# Patient Record
Sex: Male | Born: 1935 | Race: White | Hispanic: No | Marital: Married | Smoking: Former smoker
Health system: Southern US, Community
[De-identification: ages and names within clinical notes are randomized; demographics above are authoritative.]

## PROBLEM LIST (undated history)

## (undated) DIAGNOSIS — I499 Cardiac arrhythmia, unspecified: Secondary | ICD-10-CM

## (undated) DIAGNOSIS — R911 Solitary pulmonary nodule: Secondary | ICD-10-CM

## (undated) DIAGNOSIS — E785 Hyperlipidemia, unspecified: Secondary | ICD-10-CM

## (undated) DIAGNOSIS — K260 Acute duodenal ulcer with hemorrhage: Secondary | ICD-10-CM

## (undated) DIAGNOSIS — H409 Unspecified glaucoma: Secondary | ICD-10-CM

## (undated) DIAGNOSIS — Z87898 Personal history of other specified conditions: Secondary | ICD-10-CM

## (undated) DIAGNOSIS — K635 Polyp of colon: Secondary | ICD-10-CM

## (undated) DIAGNOSIS — S82009A Unspecified fracture of unspecified patella, initial encounter for closed fracture: Secondary | ICD-10-CM

## (undated) DIAGNOSIS — I10 Essential (primary) hypertension: Secondary | ICD-10-CM

## (undated) DIAGNOSIS — M1990 Unspecified osteoarthritis, unspecified site: Secondary | ICD-10-CM

## (undated) DIAGNOSIS — N289 Disorder of kidney and ureter, unspecified: Secondary | ICD-10-CM

## (undated) DIAGNOSIS — I639 Cerebral infarction, unspecified: Secondary | ICD-10-CM

## (undated) DIAGNOSIS — Z972 Presence of dental prosthetic device (complete) (partial): Secondary | ICD-10-CM

## (undated) DIAGNOSIS — D649 Anemia, unspecified: Secondary | ICD-10-CM

## (undated) HISTORY — DX: Cerebral infarction, unspecified: I63.9

## (undated) HISTORY — DX: Anemia, unspecified: D64.9

## (undated) HISTORY — PX: CATARACT EXTRACTION: SUR2

## (undated) HISTORY — PX: BAND HEMORRHOIDECTOMY: SHX1213

## (undated) HISTORY — DX: Polyp of colon: K63.5

## (undated) HISTORY — DX: Disorder of kidney and ureter, unspecified: N28.9

## (undated) HISTORY — DX: Hyperlipidemia, unspecified: E78.5

## (undated) HISTORY — DX: Essential (primary) hypertension: I10

## (undated) HISTORY — DX: Acute duodenal ulcer with hemorrhage: K26.0

---

## 2000-11-26 ENCOUNTER — Encounter: Payer: Self-pay | Admitting: Emergency Medicine

## 2000-11-26 ENCOUNTER — Inpatient Hospital Stay (HOSPITAL_COMMUNITY): Admission: EM | Admit: 2000-11-26 | Discharge: 2000-11-29 | Payer: Self-pay | Admitting: *Deleted

## 2000-11-26 ENCOUNTER — Encounter: Payer: Self-pay | Admitting: *Deleted

## 2000-12-01 ENCOUNTER — Encounter: Admission: RE | Admit: 2000-12-01 | Discharge: 2001-01-10 | Payer: Self-pay | Admitting: Pediatrics

## 2001-03-07 ENCOUNTER — Encounter: Admission: RE | Admit: 2001-03-07 | Discharge: 2001-06-05 | Payer: Self-pay | Admitting: Internal Medicine

## 2004-01-15 ENCOUNTER — Encounter (HOSPITAL_COMMUNITY): Admission: RE | Admit: 2004-01-15 | Discharge: 2004-04-14 | Payer: Self-pay | Admitting: Internal Medicine

## 2004-01-27 ENCOUNTER — Ambulatory Visit: Payer: Self-pay | Admitting: Internal Medicine

## 2004-02-04 ENCOUNTER — Ambulatory Visit: Payer: Self-pay | Admitting: Internal Medicine

## 2004-02-10 ENCOUNTER — Encounter (INDEPENDENT_AMBULATORY_CARE_PROVIDER_SITE_OTHER): Payer: Self-pay | Admitting: Specialist

## 2004-02-10 ENCOUNTER — Ambulatory Visit (HOSPITAL_COMMUNITY): Admission: RE | Admit: 2004-02-10 | Discharge: 2004-02-10 | Payer: Self-pay | Admitting: Internal Medicine

## 2004-03-30 ENCOUNTER — Ambulatory Visit: Payer: Self-pay | Admitting: Internal Medicine

## 2004-04-27 ENCOUNTER — Ambulatory Visit: Payer: Self-pay | Admitting: Internal Medicine

## 2004-05-13 ENCOUNTER — Ambulatory Visit: Payer: Self-pay | Admitting: Internal Medicine

## 2004-05-28 ENCOUNTER — Ambulatory Visit: Payer: Self-pay | Admitting: Internal Medicine

## 2004-07-08 ENCOUNTER — Ambulatory Visit: Payer: Self-pay | Admitting: Internal Medicine

## 2004-08-06 ENCOUNTER — Ambulatory Visit: Payer: Self-pay | Admitting: Internal Medicine

## 2004-09-07 ENCOUNTER — Ambulatory Visit: Payer: Self-pay | Admitting: Internal Medicine

## 2004-10-07 ENCOUNTER — Ambulatory Visit: Payer: Self-pay | Admitting: Internal Medicine

## 2004-11-09 ENCOUNTER — Ambulatory Visit: Payer: Self-pay | Admitting: Internal Medicine

## 2004-12-10 ENCOUNTER — Ambulatory Visit: Payer: Self-pay | Admitting: Internal Medicine

## 2004-12-23 ENCOUNTER — Ambulatory Visit: Payer: Self-pay | Admitting: Internal Medicine

## 2005-01-25 ENCOUNTER — Ambulatory Visit: Payer: Self-pay | Admitting: Internal Medicine

## 2005-02-18 ENCOUNTER — Ambulatory Visit: Payer: Self-pay | Admitting: Internal Medicine

## 2005-02-25 ENCOUNTER — Ambulatory Visit: Payer: Self-pay | Admitting: Internal Medicine

## 2005-03-30 ENCOUNTER — Ambulatory Visit: Payer: Self-pay | Admitting: Internal Medicine

## 2005-04-27 ENCOUNTER — Ambulatory Visit: Payer: Self-pay | Admitting: Internal Medicine

## 2005-05-24 ENCOUNTER — Ambulatory Visit: Payer: Self-pay | Admitting: Gastroenterology

## 2005-05-25 ENCOUNTER — Ambulatory Visit: Payer: Self-pay | Admitting: Internal Medicine

## 2005-07-06 ENCOUNTER — Ambulatory Visit (HOSPITAL_COMMUNITY): Admission: RE | Admit: 2005-07-06 | Discharge: 2005-07-06 | Payer: Self-pay | Admitting: Gastroenterology

## 2005-07-06 ENCOUNTER — Encounter (INDEPENDENT_AMBULATORY_CARE_PROVIDER_SITE_OTHER): Payer: Self-pay | Admitting: *Deleted

## 2005-07-09 ENCOUNTER — Ambulatory Visit: Payer: Self-pay | Admitting: Gastroenterology

## 2005-07-26 ENCOUNTER — Ambulatory Visit: Payer: Self-pay | Admitting: Gastroenterology

## 2005-07-27 ENCOUNTER — Ambulatory Visit: Payer: Self-pay | Admitting: Internal Medicine

## 2005-08-30 ENCOUNTER — Ambulatory Visit: Payer: Self-pay | Admitting: Internal Medicine

## 2005-09-28 ENCOUNTER — Ambulatory Visit: Payer: Self-pay | Admitting: Internal Medicine

## 2005-10-07 ENCOUNTER — Ambulatory Visit: Payer: Self-pay | Admitting: Internal Medicine

## 2005-10-27 ENCOUNTER — Ambulatory Visit: Payer: Self-pay | Admitting: Internal Medicine

## 2005-11-29 ENCOUNTER — Ambulatory Visit: Payer: Self-pay | Admitting: Internal Medicine

## 2005-12-30 ENCOUNTER — Ambulatory Visit: Payer: Self-pay | Admitting: Internal Medicine

## 2006-01-27 ENCOUNTER — Ambulatory Visit: Payer: Self-pay | Admitting: Internal Medicine

## 2006-02-28 ENCOUNTER — Ambulatory Visit: Payer: Self-pay | Admitting: Internal Medicine

## 2006-03-31 ENCOUNTER — Ambulatory Visit: Payer: Self-pay | Admitting: Internal Medicine

## 2006-05-02 ENCOUNTER — Ambulatory Visit: Payer: Self-pay | Admitting: Internal Medicine

## 2006-05-31 ENCOUNTER — Ambulatory Visit: Payer: Self-pay | Admitting: Internal Medicine

## 2006-06-29 ENCOUNTER — Ambulatory Visit: Payer: Self-pay | Admitting: Internal Medicine

## 2006-07-13 ENCOUNTER — Ambulatory Visit: Payer: Self-pay | Admitting: Internal Medicine

## 2006-08-12 ENCOUNTER — Ambulatory Visit: Payer: Self-pay | Admitting: Internal Medicine

## 2006-08-22 ENCOUNTER — Ambulatory Visit: Payer: Self-pay | Admitting: Internal Medicine

## 2006-08-22 LAB — CONVERTED CEMR LAB
ALT: 15 units/L (ref 0–40)
AST: 24 units/L (ref 0–37)
Albumin: 3.7 g/dL (ref 3.5–5.2)
Alkaline Phosphatase: 118 units/L — ABNORMAL HIGH (ref 39–117)
BUN: 42 mg/dL — ABNORMAL HIGH (ref 6–23)
Bilirubin, Direct: 0.1 mg/dL (ref 0.0–0.3)
CO2: 23 meq/L (ref 19–32)
Calcium: 8.9 mg/dL (ref 8.4–10.5)
Chloride: 111 meq/L (ref 96–112)
Cholesterol: 179 mg/dL (ref 0–200)
Creatinine, Ser: 2.5 mg/dL — ABNORMAL HIGH (ref 0.4–1.5)
GFR calc Af Amer: 33 mL/min
GFR calc non Af Amer: 27 mL/min
Glucose, Bld: 104 mg/dL — ABNORMAL HIGH (ref 70–99)
HDL: 39.1 mg/dL (ref >39.0)
LDL Cholesterol: 115 mg/dL — ABNORMAL HIGH (ref 0–99)
Potassium: 4.6 meq/L (ref 3.5–5.1)
Sodium: 140 meq/L (ref 135–145)
Total Bilirubin: 0.7 mg/dL (ref 0.3–1.2)
Total CHOL/HDL Ratio: 4.6
Total Protein: 7.9 g/dL (ref 6.0–8.3)
Triglycerides: 123 mg/dL (ref 0–149)
Uric Acid, Serum: 10.3 mg/dL — ABNORMAL HIGH (ref 2.4–7.0)
VLDL: 25 mg/dL (ref 0–40)

## 2006-08-24 DIAGNOSIS — N183 Chronic kidney disease, stage 3 unspecified: Secondary | ICD-10-CM | POA: Insufficient documentation

## 2006-08-24 DIAGNOSIS — M109 Gout, unspecified: Secondary | ICD-10-CM

## 2006-08-24 DIAGNOSIS — I10 Essential (primary) hypertension: Secondary | ICD-10-CM | POA: Insufficient documentation

## 2006-08-24 DIAGNOSIS — Z8679 Personal history of other diseases of the circulatory system: Secondary | ICD-10-CM

## 2006-08-24 DIAGNOSIS — E785 Hyperlipidemia, unspecified: Secondary | ICD-10-CM

## 2006-08-26 ENCOUNTER — Encounter: Admission: RE | Admit: 2006-08-26 | Discharge: 2006-08-26 | Payer: Self-pay | Admitting: Internal Medicine

## 2006-09-12 ENCOUNTER — Ambulatory Visit: Payer: Self-pay | Admitting: Internal Medicine

## 2006-09-21 ENCOUNTER — Ambulatory Visit: Payer: Self-pay | Admitting: Internal Medicine

## 2006-10-27 ENCOUNTER — Ambulatory Visit: Payer: Self-pay | Admitting: Internal Medicine

## 2006-10-27 LAB — CONVERTED CEMR LAB
INR: 3.4
Prothrombin Time: 22.2 s

## 2006-11-04 ENCOUNTER — Ambulatory Visit: Payer: Self-pay | Admitting: Internal Medicine

## 2006-11-04 DIAGNOSIS — I4891 Unspecified atrial fibrillation: Secondary | ICD-10-CM | POA: Insufficient documentation

## 2006-11-04 LAB — CONVERTED CEMR LAB
BUN: 23 mg/dL (ref 6–23)
CO2: 29 meq/L (ref 19–32)
Calcium: 9 mg/dL (ref 8.4–10.5)
Chloride: 108 meq/L (ref 96–112)
Cholesterol, target level: 200 mg/dL
Cholesterol: 159 mg/dL (ref 0–200)
Creatinine, Ser: 1.9 mg/dL — ABNORMAL HIGH (ref 0.4–1.5)
GFR calc Af Amer: 45 mL/min
GFR calc non Af Amer: 37 mL/min
Glucose, Bld: 88 mg/dL (ref 70–99)
HDL goal, serum: 40 mg/dL
HDL: 39.3 mg/dL (ref >39.0)
INR: 2.3 — ABNORMAL HIGH (ref 0.9–2.0)
LDL Cholesterol: 100 mg/dL — ABNORMAL HIGH (ref 0–99)
LDL Goal: 100 mg/dL
Potassium: 4.4 meq/L (ref 3.5–5.1)
Prothrombin Time: 18.9 s — ABNORMAL HIGH (ref 10.0–14.0)
Sodium: 143 meq/L (ref 135–145)
Total CHOL/HDL Ratio: 4
Triglycerides: 98 mg/dL (ref 0–149)
VLDL: 20 mg/dL (ref 0–40)
aPTT: 34.9 s (ref 26.5–36.5)

## 2006-11-28 ENCOUNTER — Ambulatory Visit: Payer: Self-pay | Admitting: Internal Medicine

## 2006-11-28 LAB — CONVERTED CEMR LAB
INR: 2.3
Prothrombin Time: 18.5 s

## 2006-12-28 ENCOUNTER — Ambulatory Visit: Payer: Self-pay | Admitting: Internal Medicine

## 2006-12-28 LAB — CONVERTED CEMR LAB
INR: 1.8
Prothrombin Time: 16.5 s

## 2007-01-27 ENCOUNTER — Ambulatory Visit: Payer: Self-pay | Admitting: Internal Medicine

## 2007-01-27 LAB — CONVERTED CEMR LAB
INR: 3.3
Prothrombin Time: 22 s

## 2007-02-27 ENCOUNTER — Ambulatory Visit: Payer: Self-pay | Admitting: Internal Medicine

## 2007-02-27 LAB — CONVERTED CEMR LAB
INR: 1.9
Prothrombin Time: 17.1 s

## 2007-03-31 ENCOUNTER — Ambulatory Visit: Payer: Self-pay | Admitting: Internal Medicine

## 2007-03-31 LAB — CONVERTED CEMR LAB
INR: 1.9
Prothrombin Time: 16.8 s

## 2007-04-19 ENCOUNTER — Emergency Department (HOSPITAL_COMMUNITY): Admission: EM | Admit: 2007-04-19 | Discharge: 2007-04-19 | Payer: Self-pay | Admitting: Family Medicine

## 2007-04-20 ENCOUNTER — Telehealth: Payer: Self-pay | Admitting: Internal Medicine

## 2007-04-28 ENCOUNTER — Ambulatory Visit: Payer: Self-pay | Admitting: Internal Medicine

## 2007-04-28 LAB — CONVERTED CEMR LAB
INR: 2
Prothrombin Time: 17.3 s

## 2007-05-10 ENCOUNTER — Ambulatory Visit: Payer: Self-pay | Admitting: Internal Medicine

## 2007-05-12 LAB — CONVERTED CEMR LAB
ALT: 21 units/L (ref 0–53)
AST: 26 units/L (ref 0–37)
Albumin: 3.7 g/dL (ref 3.5–5.2)
Alkaline Phosphatase: 106 units/L (ref 39–117)
BUN: 31 mg/dL — ABNORMAL HIGH (ref 6–23)
Bilirubin, Direct: 0.1 mg/dL (ref 0.0–0.3)
CO2: 25 meq/L (ref 19–32)
Calcium: 9.2 mg/dL (ref 8.4–10.5)
Chloride: 106 meq/L (ref 96–112)
Cholesterol: 162 mg/dL (ref 0–200)
Creatinine, Ser: 1.8 mg/dL — ABNORMAL HIGH (ref 0.4–1.5)
GFR calc Af Amer: 48 mL/min
GFR calc non Af Amer: 40 mL/min
Glucose, Bld: 119 mg/dL — ABNORMAL HIGH (ref 70–99)
HDL: 37.9 mg/dL — ABNORMAL LOW (ref >39.0)
LDL Cholesterol: 94 mg/dL (ref 0–99)
Potassium: 4.9 meq/L (ref 3.5–5.1)
Sodium: 138 meq/L (ref 135–145)
Total Bilirubin: 0.7 mg/dL (ref 0.3–1.2)
Total CHOL/HDL Ratio: 4.3
Total Protein: 7.1 g/dL (ref 6.0–8.3)
Triglycerides: 152 mg/dL — ABNORMAL HIGH (ref 0–149)
VLDL: 30 mg/dL (ref 0–40)

## 2007-05-26 ENCOUNTER — Ambulatory Visit: Payer: Self-pay | Admitting: Internal Medicine

## 2007-05-26 LAB — CONVERTED CEMR LAB
INR: 2.1
Prothrombin Time: 17.9 s

## 2007-06-01 ENCOUNTER — Ambulatory Visit: Payer: Self-pay | Admitting: Internal Medicine

## 2007-06-01 ENCOUNTER — Encounter: Payer: Self-pay | Admitting: Internal Medicine

## 2007-06-23 ENCOUNTER — Ambulatory Visit: Payer: Self-pay | Admitting: Internal Medicine

## 2007-06-23 LAB — CONVERTED CEMR LAB
INR: 2.3
Prothrombin Time: 18.4 s

## 2007-07-25 ENCOUNTER — Ambulatory Visit: Payer: Self-pay | Admitting: Internal Medicine

## 2007-07-25 LAB — CONVERTED CEMR LAB
INR: 2.5
Prothrombin Time: 19.3 s

## 2007-08-28 ENCOUNTER — Ambulatory Visit: Payer: Self-pay | Admitting: Internal Medicine

## 2007-08-28 LAB — CONVERTED CEMR LAB
INR: 2.4
Prothrombin Time: 18.8 s

## 2007-09-25 ENCOUNTER — Ambulatory Visit: Payer: Self-pay | Admitting: Internal Medicine

## 2007-09-25 LAB — CONVERTED CEMR LAB
INR: 2.7
Prothrombin Time: 20 s

## 2007-10-19 ENCOUNTER — Telehealth: Payer: Self-pay | Admitting: Internal Medicine

## 2007-10-27 ENCOUNTER — Ambulatory Visit: Payer: Self-pay | Admitting: Internal Medicine

## 2007-11-14 ENCOUNTER — Ambulatory Visit: Payer: Self-pay | Admitting: Internal Medicine

## 2007-11-14 LAB — CONVERTED CEMR LAB
INR: 1.9
Prothrombin Time: 16.9 s

## 2007-11-15 ENCOUNTER — Telehealth: Payer: Self-pay | Admitting: Internal Medicine

## 2007-11-22 ENCOUNTER — Inpatient Hospital Stay (HOSPITAL_COMMUNITY): Admission: EM | Admit: 2007-11-22 | Discharge: 2007-11-24 | Payer: Self-pay | Admitting: Emergency Medicine

## 2007-11-22 ENCOUNTER — Encounter: Payer: Self-pay | Admitting: Gastroenterology

## 2007-11-22 ENCOUNTER — Ambulatory Visit: Payer: Self-pay | Admitting: Internal Medicine

## 2007-11-22 ENCOUNTER — Encounter: Payer: Self-pay | Admitting: Internal Medicine

## 2007-11-22 ENCOUNTER — Ambulatory Visit: Payer: Self-pay | Admitting: Gastroenterology

## 2007-11-24 ENCOUNTER — Encounter: Payer: Self-pay | Admitting: Internal Medicine

## 2007-11-24 DIAGNOSIS — K260 Acute duodenal ulcer with hemorrhage: Secondary | ICD-10-CM | POA: Insufficient documentation

## 2007-11-24 DIAGNOSIS — J984 Other disorders of lung: Secondary | ICD-10-CM

## 2007-11-24 HISTORY — DX: Acute duodenal ulcer with hemorrhage: K26.0

## 2007-11-28 ENCOUNTER — Telehealth: Payer: Self-pay | Admitting: Internal Medicine

## 2007-11-28 ENCOUNTER — Ambulatory Visit: Payer: Self-pay | Admitting: Internal Medicine

## 2007-11-28 LAB — CONVERTED CEMR LAB
Basophils Absolute: 0.1 10*3/uL (ref 0.0–0.1)
Basophils Relative: 0.6 % (ref 0.0–3.0)
Eosinophils Absolute: 0.1 10*3/uL (ref 0.0–0.7)
Eosinophils Relative: 0.8 % (ref 0.0–5.0)
HCT: 30.2 % — ABNORMAL LOW (ref 39.0–52.0)
Hemoglobin: 10.1 g/dL — ABNORMAL LOW (ref 13.0–17.0)
INR: 1.2
Lymphocytes Relative: 16.3 % (ref 12.0–46.0)
MCHC: 33.6 g/dL (ref 30.0–36.0)
MCV: 96.1 fL (ref 78.0–100.0)
Monocytes Absolute: 1 10*3/uL (ref 0.1–1.0)
Monocytes Relative: 8.4 % (ref 3.0–12.0)
Neutro Abs: 8.9 10*3/uL — ABNORMAL HIGH (ref 1.4–7.7)
Neutrophils Relative %: 73.9 % (ref 43.0–77.0)
Platelets: 265 10*3/uL (ref 150–400)
Prothrombin Time: 13.6 s
RBC: 3.14 M/uL — ABNORMAL LOW (ref 4.22–5.81)
RDW: 12.9 % (ref 11.5–14.6)
WBC: 12.1 10*3/uL — ABNORMAL HIGH (ref 4.5–10.5)

## 2007-11-29 ENCOUNTER — Telehealth: Payer: Self-pay | Admitting: Internal Medicine

## 2007-11-30 ENCOUNTER — Telehealth: Payer: Self-pay | Admitting: Internal Medicine

## 2007-12-06 ENCOUNTER — Ambulatory Visit: Payer: Self-pay | Admitting: Internal Medicine

## 2007-12-06 LAB — CONVERTED CEMR LAB
INR: 1.3
Prothrombin Time: 14.3 s

## 2007-12-26 ENCOUNTER — Ambulatory Visit: Payer: Self-pay | Admitting: Internal Medicine

## 2007-12-26 LAB — CONVERTED CEMR LAB
INR: 2
Prothrombin Time: 17.2 s

## 2008-01-09 ENCOUNTER — Ambulatory Visit: Payer: Self-pay | Admitting: Internal Medicine

## 2008-01-09 LAB — CONVERTED CEMR LAB
INR: 3.3
Prothrombin Time: 21.9 s

## 2008-01-18 ENCOUNTER — Ambulatory Visit: Payer: Self-pay | Admitting: Internal Medicine

## 2008-02-16 ENCOUNTER — Ambulatory Visit: Payer: Self-pay | Admitting: Internal Medicine

## 2008-02-16 LAB — CONVERTED CEMR LAB
INR: 1.5
Prothrombin Time: 15.2 s

## 2008-03-04 ENCOUNTER — Telehealth: Payer: Self-pay | Admitting: Internal Medicine

## 2008-03-26 ENCOUNTER — Ambulatory Visit: Payer: Self-pay | Admitting: Internal Medicine

## 2008-03-28 LAB — CONVERTED CEMR LAB
ALT: 19 units/L (ref 0–53)
AST: 28 units/L (ref 0–37)
Albumin: 3.3 g/dL — ABNORMAL LOW (ref 3.5–5.2)
Alkaline Phosphatase: 97 units/L (ref 39–117)
Basophils Absolute: 0.1 10*3/uL (ref 0.0–0.1)
Basophils Relative: 0.8 % (ref 0.0–3.0)
Bilirubin, Direct: 0.1 mg/dL (ref 0.0–0.3)
Cholesterol: 173 mg/dL (ref 0–200)
Eosinophils Absolute: 0.3 10*3/uL (ref 0.0–0.7)
Eosinophils Relative: 3.1 % (ref 0.0–5.0)
Ferritin: 89.3 ng/mL (ref 22.0–322.0)
Folate: 6.6 ng/mL
HCT: 39.6 % (ref 39.0–52.0)
HDL: 41.8 mg/dL (ref >39.0)
Hemoglobin: 13.3 g/dL (ref 13.0–17.0)
Iron: 55 ug/dL (ref 42–165)
LDL Cholesterol: 116 mg/dL — ABNORMAL HIGH (ref 0–99)
Lymphocytes Relative: 21.6 % (ref 12.0–46.0)
MCHC: 33.7 g/dL (ref 30.0–36.0)
MCV: 91.3 fL (ref 78.0–100.0)
Monocytes Absolute: 0.8 10*3/uL (ref 0.1–1.0)
Monocytes Relative: 7.7 % (ref 3.0–12.0)
Neutro Abs: 7.3 10*3/uL (ref 1.4–7.7)
Neutrophils Relative %: 66.8 % (ref 43.0–77.0)
Platelets: 243 10*3/uL (ref 150–400)
RBC: 4.33 M/uL (ref 4.22–5.81)
RDW: 12.7 % (ref 11.5–14.6)
Saturation Ratios: 20.2 % (ref 20.0–50.0)
Total Bilirubin: 0.6 mg/dL (ref 0.3–1.2)
Total CHOL/HDL Ratio: 4.1
Total Protein: 7.9 g/dL (ref 6.0–8.3)
Transferrin: 194.6 mg/dL — ABNORMAL LOW (ref >=212.0)
Triglycerides: 78 mg/dL (ref 0–149)
VLDL: 16 mg/dL (ref 0–40)
Vitamin B-12: 295 pg/mL (ref 211–911)
WBC: 10.8 10*3/uL — ABNORMAL HIGH (ref 4.5–10.5)

## 2008-04-23 ENCOUNTER — Ambulatory Visit: Payer: Self-pay | Admitting: Internal Medicine

## 2008-04-23 LAB — CONVERTED CEMR LAB
INR: 2.7
Prothrombin Time: 20 s

## 2008-05-21 ENCOUNTER — Ambulatory Visit: Payer: Self-pay | Admitting: Internal Medicine

## 2008-05-21 LAB — CONVERTED CEMR LAB
INR: 2.3
Prothrombin Time: 18.7 s

## 2008-06-25 ENCOUNTER — Ambulatory Visit: Payer: Self-pay | Admitting: Internal Medicine

## 2008-06-25 LAB — CONVERTED CEMR LAB
INR: 2.6
Prothrombin Time: 19.5 s

## 2008-08-19 ENCOUNTER — Emergency Department (HOSPITAL_COMMUNITY): Admission: EM | Admit: 2008-08-19 | Discharge: 2008-08-19 | Payer: Self-pay | Admitting: Family Medicine

## 2008-09-13 ENCOUNTER — Ambulatory Visit: Payer: Self-pay | Admitting: Internal Medicine

## 2008-09-13 ENCOUNTER — Telehealth: Payer: Self-pay | Admitting: Internal Medicine

## 2008-09-15 LAB — CONVERTED CEMR LAB
ALT: 25 units/L (ref 0–53)
AST: 22 units/L (ref 0–37)
Albumin: 4 g/dL (ref 3.5–5.2)
Alkaline Phosphatase: 97 units/L (ref 39–117)
BUN: 47 mg/dL — ABNORMAL HIGH (ref 6–23)
Basophils Absolute: 0 10*3/uL (ref 0.0–0.1)
Basophils Relative: 0.1 % (ref 0.0–3.0)
Bilirubin, Direct: 0 mg/dL (ref 0.0–0.3)
CO2: 28 meq/L (ref 19–32)
Calcium: 9.4 mg/dL (ref 8.4–10.5)
Chloride: 108 meq/L (ref 96–112)
Cholesterol: 241 mg/dL — ABNORMAL HIGH (ref 0–200)
Creatinine, Ser: 2 mg/dL — ABNORMAL HIGH (ref 0.4–1.5)
Direct LDL: 160.7 mg/dL
Eosinophils Absolute: 0.3 10*3/uL (ref 0.0–0.7)
Eosinophils Relative: 2.3 % (ref 0.0–5.0)
GFR calc non Af Amer: 34.89 mL/min (ref >60)
Glucose, Bld: 86 mg/dL (ref 70–99)
HCT: 42.7 % (ref 39.0–52.0)
HDL: 57.1 mg/dL (ref >39.00)
Hemoglobin: 14.6 g/dL (ref 13.0–17.0)
Lymphocytes Relative: 29 % (ref 12.0–46.0)
Lymphs Abs: 3.2 10*3/uL (ref 0.7–4.0)
MCHC: 34.3 g/dL (ref 30.0–36.0)
MCV: 94 fL (ref 78.0–100.0)
Monocytes Absolute: 1 10*3/uL (ref 0.1–1.0)
Monocytes Relative: 9.4 % (ref 3.0–12.0)
Neutro Abs: 6.4 10*3/uL (ref 1.4–7.7)
Neutrophils Relative %: 59.2 % (ref 43.0–77.0)
Platelets: 203 10*3/uL (ref 150.0–400.0)
Potassium: 6.2 meq/L (ref 3.5–5.1)
RBC: 4.54 M/uL (ref 4.22–5.81)
RDW: 14.6 % (ref 11.5–14.6)
Sodium: 143 meq/L (ref 135–145)
TSH: 1.44 microintl units/mL (ref 0.35–5.50)
Total Bilirubin: 0.9 mg/dL (ref 0.3–1.2)
Total CHOL/HDL Ratio: 4
Total Protein: 8.2 g/dL (ref 6.0–8.3)
Triglycerides: 130 mg/dL (ref 0.0–149.0)
VLDL: 26 mg/dL (ref 0.0–40.0)
WBC: 10.9 10*3/uL — ABNORMAL HIGH (ref 4.5–10.5)

## 2008-09-16 ENCOUNTER — Ambulatory Visit: Payer: Self-pay | Admitting: Internal Medicine

## 2008-09-17 ENCOUNTER — Ambulatory Visit: Payer: Self-pay | Admitting: Internal Medicine

## 2008-09-18 LAB — CONVERTED CEMR LAB
BUN: 32 mg/dL — ABNORMAL HIGH (ref 6–23)
CO2: 25 meq/L (ref 19–32)
Calcium: 8.9 mg/dL (ref 8.4–10.5)
Chloride: 109 meq/L (ref 96–112)
Creatinine, Ser: 1.8 mg/dL — ABNORMAL HIGH (ref 0.4–1.5)
GFR calc non Af Amer: 39.4 mL/min (ref >60)
Glucose, Bld: 81 mg/dL (ref 70–99)
Potassium: 5.5 meq/L — ABNORMAL HIGH (ref 3.5–5.1)
Sodium: 140 meq/L (ref 135–145)

## 2008-09-25 ENCOUNTER — Ambulatory Visit: Payer: Self-pay | Admitting: Internal Medicine

## 2008-09-25 LAB — CONVERTED CEMR LAB
BUN: 31 mg/dL — ABNORMAL HIGH (ref 6–23)
CO2: 27 meq/L (ref 19–32)
Calcium: 9 mg/dL (ref 8.4–10.5)
Chloride: 108 meq/L (ref 96–112)
Creatinine, Ser: 1.7 mg/dL — ABNORMAL HIGH (ref 0.4–1.5)
GFR calc non Af Amer: 42.09 mL/min (ref >60)
Glucose, Bld: 91 mg/dL (ref 70–99)
Potassium: 3.9 meq/L (ref 3.5–5.1)
Sodium: 142 meq/L (ref 135–145)

## 2008-10-01 ENCOUNTER — Ambulatory Visit: Payer: Self-pay | Admitting: Internal Medicine

## 2008-10-11 ENCOUNTER — Ambulatory Visit: Payer: Self-pay | Admitting: Internal Medicine

## 2008-10-30 ENCOUNTER — Ambulatory Visit: Payer: Self-pay | Admitting: Family Medicine

## 2008-11-01 ENCOUNTER — Telehealth: Payer: Self-pay | Admitting: Family Medicine

## 2008-11-08 ENCOUNTER — Ambulatory Visit: Payer: Self-pay | Admitting: Internal Medicine

## 2008-11-08 LAB — CONVERTED CEMR LAB: Prothrombin Time: 19.2 s

## 2008-12-06 ENCOUNTER — Ambulatory Visit: Payer: Self-pay | Admitting: Internal Medicine

## 2009-01-03 ENCOUNTER — Ambulatory Visit: Payer: Self-pay | Admitting: Internal Medicine

## 2009-02-06 ENCOUNTER — Ambulatory Visit: Payer: Self-pay | Admitting: Internal Medicine

## 2009-02-06 LAB — CONVERTED CEMR LAB: Prothrombin Time: 18.2 s

## 2009-02-17 ENCOUNTER — Telehealth: Payer: Self-pay | Admitting: Internal Medicine

## 2009-02-27 ENCOUNTER — Telehealth: Payer: Self-pay | Admitting: Internal Medicine

## 2009-03-10 ENCOUNTER — Ambulatory Visit: Payer: Self-pay | Admitting: Internal Medicine

## 2009-03-10 LAB — CONVERTED CEMR LAB
INR: 2.2
Prothrombin Time: 18.1 s

## 2009-03-31 ENCOUNTER — Telehealth: Payer: Self-pay | Admitting: Internal Medicine

## 2009-04-07 ENCOUNTER — Ambulatory Visit: Payer: Self-pay | Admitting: Internal Medicine

## 2009-04-09 LAB — CONVERTED CEMR LAB
ALT: 20 units/L (ref 0–53)
AST: 27 units/L (ref 0–37)
Albumin: 3.5 g/dL (ref 3.5–5.2)
Alkaline Phosphatase: 82 units/L (ref 39–117)
CO2: 24 meq/L (ref 19–32)
Calcium: 9 mg/dL (ref 8.4–10.5)
Cholesterol: 182 mg/dL (ref 0–200)
Creatinine, Ser: 1.3 mg/dL (ref 0.4–1.5)
GFR calc non Af Amer: 57.28 mL/min (ref >60)
HDL: 49.3 mg/dL (ref >39.00)
Sodium: 141 meq/L (ref 135–145)
TSH: 1.08 microintl units/mL (ref 0.35–5.50)
Total CHOL/HDL Ratio: 4
Total Protein: 6.9 g/dL (ref 6.0–8.3)
Triglycerides: 105 mg/dL (ref 0.0–149.0)

## 2009-04-18 ENCOUNTER — Telehealth: Payer: Self-pay | Admitting: Internal Medicine

## 2009-04-22 ENCOUNTER — Telehealth: Payer: Self-pay | Admitting: Internal Medicine

## 2009-05-20 ENCOUNTER — Ambulatory Visit: Payer: Self-pay | Admitting: Internal Medicine

## 2009-05-23 ENCOUNTER — Telehealth: Payer: Self-pay | Admitting: Internal Medicine

## 2009-05-29 ENCOUNTER — Telehealth: Payer: Self-pay | Admitting: Internal Medicine

## 2009-06-20 ENCOUNTER — Ambulatory Visit: Payer: Self-pay | Admitting: Internal Medicine

## 2009-07-21 ENCOUNTER — Ambulatory Visit: Payer: Self-pay | Admitting: Internal Medicine

## 2009-08-20 ENCOUNTER — Ambulatory Visit: Payer: Self-pay | Admitting: Internal Medicine

## 2009-08-20 LAB — CONVERTED CEMR LAB: INR: 2

## 2009-08-27 ENCOUNTER — Telehealth: Payer: Self-pay | Admitting: Internal Medicine

## 2009-09-24 ENCOUNTER — Ambulatory Visit: Payer: Self-pay | Admitting: Internal Medicine

## 2009-09-24 LAB — CONVERTED CEMR LAB: INR: 2.8

## 2009-09-26 LAB — CONVERTED CEMR LAB
ALT: 29 units/L (ref 0–53)
Albumin: 3.9 g/dL (ref 3.5–5.2)
BUN: 22 mg/dL (ref 6–23)
Basophils Relative: 0.3 % (ref 0.0–3.0)
Bilirubin, Direct: 0.1 mg/dL (ref 0.0–0.3)
Chloride: 108 meq/L (ref 96–112)
Cholesterol: 191 mg/dL (ref 0–200)
Eosinophils Absolute: 0.1 10*3/uL (ref 0.0–0.7)
GFR calc non Af Amer: 52.52 mL/min (ref >60)
LDL Cholesterol: 105 mg/dL — ABNORMAL HIGH (ref 0–99)
Lymphocytes Relative: 21.8 % (ref 12.0–46.0)
MCHC: 33.9 g/dL (ref 30.0–36.0)
MCV: 96.4 fL (ref 78.0–100.0)
Monocytes Absolute: 1.3 10*3/uL — ABNORMAL HIGH (ref 0.1–1.0)
Neutrophils Relative %: 68.1 % (ref 43.0–77.0)
Platelets: 226 10*3/uL (ref 150.0–400.0)
Potassium: 3.7 meq/L (ref 3.5–5.1)
RBC: 4.5 M/uL (ref 4.22–5.81)
Sodium: 143 meq/L (ref 135–145)
Total Protein: 7.3 g/dL (ref 6.0–8.3)
Triglycerides: 131 mg/dL (ref 0.0–149.0)
Uric Acid, Serum: 4.4 mg/dL (ref 4.0–7.8)
VLDL: 26.2 mg/dL (ref 0.0–40.0)
WBC: 14.6 10*3/uL — ABNORMAL HIGH (ref 4.5–10.5)

## 2009-11-05 ENCOUNTER — Ambulatory Visit: Payer: Self-pay | Admitting: Internal Medicine

## 2009-11-05 DIAGNOSIS — B356 Tinea cruris: Secondary | ICD-10-CM | POA: Insufficient documentation

## 2009-11-05 LAB — CONVERTED CEMR LAB: INR: 2.4

## 2009-12-05 ENCOUNTER — Ambulatory Visit: Payer: Self-pay | Admitting: Internal Medicine

## 2009-12-05 LAB — CONVERTED CEMR LAB: INR: 2.3

## 2010-01-05 ENCOUNTER — Ambulatory Visit: Payer: Self-pay | Admitting: Internal Medicine

## 2010-01-05 LAB — CONVERTED CEMR LAB: INR: 1.9

## 2010-02-06 ENCOUNTER — Ambulatory Visit: Payer: Self-pay | Admitting: Internal Medicine

## 2010-02-06 LAB — CONVERTED CEMR LAB: INR: 1.5

## 2010-02-16 ENCOUNTER — Ambulatory Visit: Payer: Self-pay | Admitting: Internal Medicine

## 2010-02-16 LAB — CONVERTED CEMR LAB: INR: 2.2

## 2010-03-18 ENCOUNTER — Ambulatory Visit: Payer: Self-pay | Admitting: Internal Medicine

## 2010-03-18 LAB — CONVERTED CEMR LAB: INR: 2.5

## 2010-04-08 ENCOUNTER — Ambulatory Visit
Admission: RE | Admit: 2010-04-08 | Discharge: 2010-04-08 | Payer: Self-pay | Source: Home / Self Care | Attending: Internal Medicine | Admitting: Internal Medicine

## 2010-04-12 ENCOUNTER — Encounter: Payer: Self-pay | Admitting: Internal Medicine

## 2010-04-23 NOTE — Progress Notes (Signed)
Summary: Coumadin question?  Phone Note Call from Patient   Caller: Patient Call For: Birdie Sons MD Summary of Call: Pt leaves message on voice mail that his PT and INR was 3.8?  and is having surgery on eyes on Monday.  Wantst to stop Coumadin for 3 days? [phone] Initial call taken by: Lynann Beaver CMA,  May 23, 2009 9:05 AM  Follow-up for Phone Call        ok to stop warfarin for 3 days Follow-up by: Birdie Sons MD,  May 23, 2009 11:48 AM  Additional Follow-up for Phone Call Additional follow up Details #1::        Pt. notified. Additional Follow-up by: Lynann Beaver CMA,  May 23, 2009 12:19 PM

## 2010-04-23 NOTE — Assessment & Plan Note (Signed)
Summary: 1 month rov/pt/njr   Vital Signs:  Patient profile:   77 year old male Weight:      160 pounds Temp:     97.9 degrees F 97.9 Pulse rate:   84 / minute Pulse rhythm:   regular Resp:     16 per minute BP sitting:   122 / 68  Vitals Entered By: Lynann Beaver CMA (January 05, 2010 11:14 AM)  History of Present Illness: Tinea Cruris---rash resolved  GOUT---no recurrence and off prednisone  HTN---tolerating meds---NO DIURETIC  All other systems reviewed and were negative   Current Medications (verified): 1)  Amlodipine Besylate 5 Mg Tabs (Amlodipine Besylate) .... Take 1 Tablet By Mouth Once A Day 2)  Simvastatin 40 Mg Tabs (Simvastatin) .... Take One Tablet At Bedtime 3)  Warfarin Sodium 2.5 Mg Tabs (Warfarin Sodium) .... Take One On M-W-F or As Directed 4)  Warfarin Sodium 2 Mg Tabs (Warfarin Sodium) .... Take One On Su-Tues-Thur-Sat or As Directed 5)  Allopurinol 300 Mg Tabs (Allopurinol) .... Take 1 Tablet By Mouth Once A Day 6)  Terbinafine Hcl 250 Mg Tabs (Terbinafine Hcl) .... Take 1 Tablet By Mouth Once A Day For 14 Days  Allergies (verified): No Known Drug Allergies  Past History:  Past Medical History: Last updated: 03/26/2008 Gout Hyperlipidemia Hypertension Cerebrovascular accident, hx of colon polyps Renal insufficiency Anemia-NOS (? 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   Head:  normocephalic and atraumatic.   Ears:  R ear normal and L ear normal.   Neck:  No deformities, masses, or tenderness noted. Lungs:  normal respiratory effort, no intercostal retractions, no accessory muscle use, and normal breath  sounds.   Heart:  normal rate, no gallop, and no rub.   Abdomen:  Bowel sounds positive,abdomen soft and non-tender without masses, organomegaly or hernias noted. Skin:  well demarcated rash around groin Psych:  good eye contact and not anxious appearing.     Impression & Recommendations:  Problem # 1:  TINEA CRURIS (ICD-110.3) resolved  Problem # 2:  HYPERTENSION (ICD-401.9) controlled continue current medications  His updated medication list for this problem includes:    Amlodipine Besylate 5 Mg Tabs (Amlodipine besylate) .Marland Kitchen... Take 1 tablet by mouth once a day  BP today: 122/68 Prior BP: 138/84 (12/05/2009)  Prior 10 Yr Risk Heart Disease: 22 % (11/04/2006)  Labs Reviewed: K+: 3.7 (09/24/2009) Creat: : 1.4 (09/24/2009)   Chol: 191 (09/24/2009)   HDL: 59.60 (09/24/2009)   LDL: 105 (09/24/2009)   TG: 131.0 (09/24/2009)  Problem # 3:  HYPERLIPIDEMIA (ICD-272.4) controlled continue current medications  His updated medication list for this problem includes:    Simvastatin 40 Mg Tabs (Simvastatin) .Marland Kitchen... Take one tablet at bedtime  Labs Reviewed: SGOT: 29 (09/24/2009)   SGPT: 29 (09/24/2009)  Lipid Goals: Chol Goal: 200 (11/04/2006)   HDL Goal: 40 (11/04/2006)   LDL Goal: 100 (11/04/2006)   TG Goal: 150 (11/04/2006)  Prior 10 Yr Risk Heart Disease: 22 % (11/04/2006)   HDL:59.60 (09/24/2009), 49.30 (04/07/2009)  LDL:105 (09/24/2009), 112 (04/07/2009)  Chol:191 (09/24/2009), 182 (04/07/2009)  Trig:131.0 (09/24/2009), 105.0 (04/07/2009)  Problem # 4:  GOUT (ICD-274.9) sxs have resolved  His updated medication list for this problem includes:  Allopurinol 300 Mg Tabs (Allopurinol) .Marland Kitchen... Take 1 tablet by mouth once a day  Complete Medication List: 1)  Amlodipine Besylate 5 Mg Tabs (Amlodipine besylate) .... Take 1 tablet by mouth once a day 2)  Simvastatin 40 Mg Tabs (Simvastatin) .... Take one tablet at bedtime 3)  Warfarin Sodium 2.5 Mg Tabs (Warfarin sodium) .... Take  one on m-w-f or as directed 4)  Warfarin Sodium 2 Mg Tabs (Warfarin sodium) .... Take one on su-tues-thur-sat or as directed 5)  Allopurinol 300 Mg Tabs (Allopurinol) .... Take 1 tablet by mouth once a day 6)  Terbinafine Hcl 250 Mg Tabs (Terbinafine hcl) .... Take 1 tablet by mouth once a day for 14 days  Other Orders: Protime ([id number]) Fingerstick ([id number])  Patient Instructions: 1)  schedule PROTIME monthly 2)  See me 3 months   Orders Added: 1)  Protime [[id number]] 2)  Fingerstick [[id number]] 3)  Est. Patient Level IV [78295]     ANTICOAGULATION RECORD PREVIOUS REGIMEN & LAB RESULTS Anticoagulation Diagnosis:  v58.83,v58.61,427.31 on  10/27/2006 Previous INR Goal Range:  2.0-3.0 on  08/28/2007 Previous INR:  2.3 on  12/05/2009 Previous Coumadin Dose(mg):  2mg  on m,w,f 2.5mg  on other days on  08/20/2009 Previous Regimen:  same on  12/05/2009 Previous Coagulation Comments:  Patient has an appointment on Dec. 5 and wants to know if he can come then for pt? on  02/16/2008  NEW REGIMEN & LAB RESULTS Current INR: 1.9 Regimen: same  (no change)   Anticoagulation Visit Questionnaire Coumadin dose missed/changed:  No Abnormal Bleeding Symptoms:  No  Any diet changes including alcohol intake, vegetables or greens since the last visit:  No Any illnesses or hospitalizations since the last visit:  No Any signs of clotting since the last visit (including chest discomfort, dizziness, shortness of breath, arm tingling, slurred speech, swelling or redness in leg):  No  MEDICATIONS AMLODIPINE BESYLATE 5 MG TABS (AMLODIPINE BESYLATE) Take 1 tablet by mouth once a day SIMVASTATIN 40 MG TABS (SIMVASTATIN) Take one tablet at bedtime WARFARIN SODIUM 2.5 MG TABS (WARFARIN SODIUM) Take one on M-W-F or as directed WARFARIN SODIUM 2 MG TABS (WARFARIN SODIUM) Take one on Su-Tues-Thur-Sat or as directed ALLOPURINOL 300 MG TABS (ALLOPURINOL) Take 1 tablet by mouth once a day TERBINAFINE HCL 250 MG  TABS (TERBINAFINE HCL) Take 1 tablet by mouth once a day for 14 days    Laboratory Results   Blood Tests      INR: 1.9   (Normal Range: 0.88-1.12   Therap INR: 2.0-3.5) Comments: Rita Ohara  January 05, 2010 11:02 AM

## 2010-04-23 NOTE — Assessment & Plan Note (Signed)
Summary: FU gout/et/then go to pt/cjr   Vital Signs:  Patient profile:   77 year old male Weight:      162 pounds Temp:     97.5 degrees F Pulse rate:   90 / minute Pulse rhythm:   regular Resp:     12 per minute BP sitting:   152 / 80  (left arm) Cuff size:   regular  Vitals Entered By: Gladis Riffle, RN (May 20, 2009 10:38 AM)  Serial Vital Signs/Assessments:  Time      Position  BP       Pulse  Resp  Temp     By                     136/78                         Birdie Sons MD  CC: discuss gout--states no episodes since on prednisone taper Is Patient Diabetic? No   CC:  discuss gout--states no episodes since on prednisone taper.  History of Present Illness:  Follow-Up Visit      This is a 77 year old man who presents for Follow-up visit.  The patient denies chest pain and palpitations.  Since the last visit the patient notes no new problems or concerns.  The patient reports taking meds as prescribed.  When questioned about possible medication side effects, the patient notes none.    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 Take 1 Tablet By Mouth Once A Day 2)  Simvastatin 40 Mg Tabs (Simvastatin) .... Take One Tablet At  Bedtime 3)  Warfarin Sodium 2.5 Mg Tabs (Warfarin Sodium) .... Take One On M-W-F or As Directed 4)  Warfarin Sodium 2 Mg Tabs (Warfarin Sodium) .... Take One On Su-Tues-Thur-Sat or As Directed 5)  Ranitidine Hcl 150 Mg Tabs (Ranitidine Hcl) .... Once Daily 6)  Allopurinol 300 Mg Tabs (Allopurinol) .... Take 1 Tablet By Mouth Once A Day 7)  Prednisone 10 Mg Tabs (Prednisone) .... Take 2 Tablets By Mouth Once A Day   X 7 Days, Then 1 1/2 Daily X 7 Days, Then 1 Daily Until Sees Md On 05/20/09  Current Medications (verified): 1)  Amlodipine Besylate 5 Mg Tabs (Amlodipine Besylate) .... Take 1 Tablet By Mouth Once A Day 2)  Simvastatin 40 Mg Tabs (Simvastatin) .... Take One Tablet At Bedtime 3)  Warfarin Sodium 2.5 Mg Tabs (Warfarin Sodium) .... Take One On M-W-F or As Directed 4)  Warfarin Sodium 2 Mg Tabs (Warfarin Sodium) .... Take One On Su-Tues-Thur-Sat or As Directed 5)  Ranitidine Hcl 150 Mg Tabs (Ranitidine Hcl) .... Once Daily 6)  Allopurinol 300 Mg Tabs (Allopurinol) .... Take  1 Tablet By Mouth Once A Day 7)  Prednisone 10 Mg Tabs (Prednisone) .... Take 2 Tablets By Mouth Once A Day   X 7 Days, Then 1 1/2 Daily X 7 Days, Then 1 Daily Until Sees Md On 05/20/09  Allergies (verified): No Known Drug Allergies  Past History:  Past Medical History: Last updated: 03/26/2008 Gout Hyperlipidemia Hypertension Cerebrovascular accident, hx of colon polyps Renal insufficiency Anemia-NOS (? 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   Eyes:  pupils equal and pupils round.   Ears:  R ear normal and L ear normal.   Neck:  No deformities, masses, or tenderness noted. Chest Wall:  No deformities, masses, tenderness or gynecomastia noted. Lungs:  normal respiratory effort, no intercostal retractions, no accessory muscle use, and normal breath sounds.   Heart:  normal rate and no murmur.   Abdomen:  Bowel sounds positive,abdomen soft and non-tender without masses, organomegaly or hernias noted. Msk:  No deformity or scoliosis noted of thoracic or lumbar spine.     Pulses:  R radial normal and L radial normal.   Neurologic:  cranial nerves II-XII intact and gait normal.   Skin:  turgor normal and color normal.   Psych:  memory intact for recent and remote and good eye contact.     Impression & Recommendations:  Problem # 1:  GOUT (ICD-274.9) no recurrence on prednisone will now taper---slowly His updated medication list for this problem includes:    Allopurinol 300 Mg Tabs (Allopurinol) .Marland Kitchen... Take 1 tablet by mouth once a day  Problem # 2:  ATRIAL FIBRILLATION (ICD-427.31) protime today no bleeding complications  His updated medication list for this problem includes:    Amlodipine Besylate 5 Mg Tabs (Amlodipine besylate) .Marland Kitchen... Take 1 tablet by mouth once a day    Warfarin Sodium 2.5 Mg Tabs (Warfarin sodium) .Marland Kitchen... Take one on m-w-f or as directed    Warfarin Sodium 2 Mg Tabs (Warfarin sodium) .Marland Kitchen... Take one on su-tues-thur-sat or as directed  Reviewed the following: PT: 17.3 (04/07/2009)   INR: 2.0 (04/07/2009) Next Protime: 1 month (dated on 04/07/2009)  Problem # 3:  HYPERTENSION (ICD-401.9) see serial assessment His updated medication list for this problem includes:    Amlodipine Besylate 5 Mg Tabs (Amlodipine besylate) .Marland Kitchen... Take 1 tablet by mouth once a day  BP today: 152/80 Prior BP: 130/78 (04/07/2009)  Prior 10 Yr Risk Heart  Disease: 22 % (11/04/2006)  Labs Reviewed: K+: 5.1 (04/07/2009) Creat: : 1.3 (04/07/2009)   Chol: 182 (04/07/2009)   HDL: 49.30 (04/07/2009)   LDL: 112 (04/07/2009)   TG: 105.0 (04/07/2009)  Problem # 4:  CEREBROVASCULAR ACCIDENT, HX OF (ICD-V12.50)  no recurrent sxs  Complete Medication List: 1)  Amlodipine Besylate 5 Mg Tabs (Amlodipine besylate) .... Take 1 tablet by mouth once a day 2)  Simvastatin 40 Mg Tabs (Simvastatin) .... Take one tablet at bedtime 3)  Warfarin Sodium 2.5 Mg Tabs (Warfarin sodium) .... Take one on m-w-f or as directed 4)  Warfarin Sodium 2 Mg Tabs (Warfarin sodium) .Marland KitchenMarland KitchenMarland Kitchen  Take one on su-tues-thur-sat or as directed 5)  Ranitidine Hcl 150 Mg Tabs (Ranitidine hcl) .... Once daily 6)  Allopurinol 300 Mg Tabs (Allopurinol) .... Take 1 tablet by mouth once a day 7)  Prednisone 5 Mg Tabs (Prednisone) .... Take 1 tablet by mouth once a day  Patient Instructions: 1)  decrease prednisone to 5mg  daily 2)  Please schedule a follow-up appointment in 1 month. (same day as protime) 3)  cancel 07/07/09 appt Prescriptions: PREDNISONE 5 MG TABS (PREDNISONE) Take 1 tablet by mouth once a day  #30 x 1   Entered and Authorized by:   Birdie Sons MD   Signed by:   Birdie Sons MD on 05/20/2009   Method used:   Electronically to        [facility]* (retail)       [address] [place], [place]  [address]       Ph: [phone]       Fax: [phone]   RxID:   [id number]   Appended Document: Orders Update     Clinical Lists Changes  Orders: Added new Service order of Protime [id number]) - Signed Observations: Added new observation of ABNORM BLEED: No (05/20/2009 15:49) Added new observation of COUMADIN CHG: No (05/20/2009 15:49) Added new observation of NEXT PT: 4 weeks (05/20/2009 15:49) Added new observation of CUR. REGIMEN: 2mg  qd (05/20/2009 15:49) Added new observation of COMMENTS2: Wynona Canes, CMA   May 20, 2009 3:50 PM  (05/20/2009 15:49) Added new observation of INR: 3.8  (05/20/2009 15:49) Added new observation of PT PATIENT: 23.6 s (05/20/2009 15:49)      Laboratory Results   Blood Tests   Date/Time Recieved: May 20, 2009 3:50 PM  Date/Time Reported: May 20, 2009 3:50 PM   PT: 23.6 s   (Normal Range: 10.6-13.4)  INR: 3.8   (Normal Range: 0.88-1.12   Therap INR: 2.0-3.5) Comments: Wynona Canes, CMA  May 20, 2009 3:50 PM       ANTICOAGULATION RECORD PREVIOUS REGIMEN & LAB RESULTS Anticoagulation Diagnosis:  v58.83,v58.61,427.31 on  10/27/2006 Previous INR Goal Range:  2.0-3.0 on  08/28/2007 Previous INR:  2.0 on  04/07/2009 Previous Coumadin Dose(mg):  2.5mg  on mon,wed,fri. 2mg  other days on  03/26/2008 Previous Regimen:  same on  04/07/2009 Previous Coagulation Comments:  Patient has an appointment on Dec. 5 and wants to know if he can come then for pt? on  02/16/2008  NEW REGIMEN & LAB RESULTS Current INR: 3.8 Regimen: 2mg  qd       Repeat testing in: 4 weeks MEDICATIONS AMLODIPINE BESYLATE 5 MG TABS (AMLODIPINE BESYLATE) Take 1 tablet by mouth once a day SIMVASTATIN 40 MG TABS (SIMVASTATIN) Take one tablet at bedtime WARFARIN SODIUM 2.5 MG TABS (WARFARIN SODIUM) Take one on M-W-F or as directed WARFARIN SODIUM 2 MG TABS (WARFARIN SODIUM) Take one on Su-Tues-Thur-Sat or as directed RANITIDINE HCL 150 MG TABS (RANITIDINE HCL) once daily ALLOPURINOL 300 MG TABS (ALLOPURINOL) Take 1 tablet by mouth once a day PREDNISONE 5 MG TABS (PREDNISONE) Take 1 tablet by mouth once a day   Anticoagulation Visit Questionnaire      Coumadin dose missed/changed:  No      Abnormal Bleeding Symptoms:  No   Any diet changes including alcohol intake, vegetables or greens since the last visit:  No Any illnesses or hospitalizations since the last  visit:  No Any signs of clotting since the last visit (including chest discomfort, dizziness, shortness of breath, arm  tingling, slurred speech, swelling or redness in leg):  No

## 2010-04-23 NOTE — Progress Notes (Signed)
Summary: Coumadin refil  Phone Note Call from Patient   Summary of Call: [phone] In [place] and needs Coumadin. Coumadin 2 mg. #20 Dosage is 2 mg. three times weekly and 2.5 mg. other days. Initial call taken by: Lynann Beaver CMA,  March 31, 2009 9:45 AM

## 2010-04-23 NOTE — Assessment & Plan Note (Signed)
Summary: 3 mo rov/mm/PT/RCD   Vital Signs:  Patient profile:   77 year old male Weight:      159 pounds Temp:     97.6 degrees F Pulse rate:   80 / minute Resp:     12 per minute BP sitting:   130 / 78  (left arm)  Vitals Entered By: Gladis Riffle, RN (April 07, 2009 9:50 AM)   History of Present Illness:  Follow-Up Visit      This is a 77 year old man who presents for Follow-up visit.  The patient denies chest pain, palpitations, dizziness, syncope, edema, SOB, DOE, PND, and orthopnea.  The patient reports taking meds as prescribed.  When questioned about possible medication side effects, the patient notes none.  He has recent gout flares---doing much better after prednisone and starting allopurinol  All other systems reviewed and were negative   Preventive Screening-Counseling & Management  Alcohol-Tobacco     Smoking Status: quit > 6 months     Year Quit: 1970  Current Problems (verified): 1)  Anemia-nos  (ICD-285.9) 2)  Pulmonary Nodule, Right Upper Lobe  (ICD-518.89) 3)  Acut Duod Ulcer W/hemorr w/o Mention Obstruction  (ICD-532.00) 4)  Atrial Fibrillation  (ICD-427.31) 5)  Coumadin Therapy  (ICD-V58.61) 6)  Encounter For Therapeutic Drug Monitoring  (ICD-V58.83) 7)  Renal Insufficiency  (ICD-588.9) 8)  Cerebrovascular Accident, Hx of  (ICD-V12.50) 9)  Hypertension  (ICD-401.9) 10)  Hyperlipidemia  (ICD-272.4) 11)  Gout  (ICD-274.9)  Current Medications (verified): 1)  Amlodipine Besylate 5 Mg Tabs (Amlodipine Besylate) .... Take 1 Tablet By Mouth Once A Day 2)  Simvastatin 40 Mg Tabs (Simvastatin) .... Take One Tablet At Bedtime 3)  Warfarin Sodium 2.5 Mg Tabs (Warfarin Sodium) .... Take One On M-W-F or As Directed 4)  Warfarin Sodium 2 Mg Tabs (Warfarin Sodium) .... Take One On Su-Tues-Thur-Sat or As Directed 5)  Ranitidine Hcl 150 Mg Tabs (Ranitidine Hcl) .... Once Daily 6)  Allopurinol 300 Mg Tabs (Allopurinol) .... Take 1 Tablet By Mouth Once A Day 7)   Prednisone 10 Mg Tabs (Prednisone) .... Take 1 Tablet By Mouth Once A Day X 5 Days For Gout Flare  Allergies (verified): No Known Drug Allergies  Comments:  Nurse/Medical Assistant: 3 month rov--has been on prednisone x 2 this year for gout flare, has diet he has been following to avoid foods with good relief  The patient's medications and allergies were reviewed with the patient and were updated in the Medication and Allergy Lists. Gladis Riffle, RN (April 07, 2009 9:52 AM)  Past History:  Past Medical History: Last updated: 03/26/2008 Gout Hyperlipidemia Hypertension Cerebrovascular accident, hx of colon polyps Renal insufficiency Anemia-NOS (? 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   Eyes:  pupils equal and pupils round.   Ears:  R ear normal and L ear normal.   Neck:  No deformities, masses, or tenderness noted. Chest Wall:  No deformities, masses, tenderness or gynecomastia noted. Lungs:  normal respiratory effort, no intercostal retractions, no accessory muscle use, and normal  breath sounds.   Heart:  normal rate and no murmur.   Abdomen:  Bowel sounds positive,abdomen soft and non-tender without masses, organomegaly or hernias noted. Msk:  No deformity or scoliosis noted of thoracic or lumbar spine.     Neurologic:  cranial nerves II-XII intact and gait normal.   Skin:  turgor normal and color normal.   Psych:  normally interactive  and good eye contact.     Impression & Recommendations:  Problem # 1:  GOUT (ICD-274.9)  resolved after prednisone continue allopurinol His updated medication list for this problem includes:    Allopurinol 300 Mg Tabs (Allopurinol) .Marland Kitchen... Take 1 tablet by mouth once a day  Orders: Venipuncture ([address]) TLB-Uric Acid, Blood (84550-URIC)  Problem # 2:  HYPERTENSION (ICD-401.9) controlled continue current medications  His updated medication list for this problem includes:    Amlodipine Besylate 5 Mg Tabs (Amlodipine besylate) .Marland Kitchen... Take 1 tablet by mouth once a day  Orders: Venipuncture (60454) TLB-BMP (Basic Metabolic Panel-BMET) (80048-METABOL)  BP today: 130/78 Prior BP: 122/70 (01/03/2009)  Prior 10 Yr Risk Heart Disease: 22 % (11/04/2006)  Labs Reviewed: K+: 3.9 (09/25/2008) Creat: : 1.7 (09/25/2008)   Chol: 241 (09/13/2008)   HDL: 57.10 (09/13/2008)   LDL: 116 (03/26/2008)   TG: 130.0 (09/13/2008)  Problem # 3:  ATRIAL FIBRILLATION (ICD-427.31)  regular protime--monthly he has missed several doses of warfarin (out of town) His updated medication list for this problem includes:    Amlodipine Besylate 5 Mg Tabs (Amlodipine besylate) .Marland Kitchen... Take 1 tablet by mouth once a day    Warfarin Sodium 2.5 Mg Tabs (Warfarin sodium) .Marland Kitchen... Take one on m-w-f or as directed    Warfarin Sodium 2 Mg Tabs (Warfarin sodium) .Marland Kitchen... Take one on su-tues-thur-sat or as directed  Orders: Protime (09811BJ)  Problem # 4:  CEREBROVASCULAR ACCIDENT, HX OF (ICD-V12.50) no recurrent sxs  Problem # 5:  PULMONARY NODULE, RIGHT UPPER LOBE (ICD-518.89) reviewed CT scan f/u 2 years  Complete Medication List: 1)  Amlodipine Besylate 5 Mg Tabs (Amlodipine besylate) .... Take 1 tablet by mouth once a day 2)  Simvastatin 40 Mg Tabs (Simvastatin) .... Take one tablet at bedtime 3)  Warfarin Sodium 2.5 Mg Tabs (Warfarin sodium) .... Take one on m-w-f or as directed 4)  Warfarin Sodium 2 Mg Tabs  (Warfarin sodium) .... Take one on su-tues-thur-sat or as directed 5)  Ranitidine Hcl 150 Mg Tabs (Ranitidine hcl) .... Once daily 6)  Allopurinol 300 Mg Tabs (Allopurinol) .... Take 1 tablet by mouth once a day 7)  Prednisone 10 Mg Tabs (Prednisone) .... Take 1 tablet by mouth once a day x 5 days for gout flare  Other Orders: TLB-Lipid Panel (80061-LIPID) TLB-Hepatic/Liver Function Pnl (80076-HEPATIC) TLB-TSH (Thyroid Stimulating Hormone) (84443-TSH)  Appended Document: 3 mo rov/mm/PT/RCD   ANTICOAGULATION RECORD PREVIOUS REGIMEN & LAB RESULTS Anticoagulation Diagnosis:  v58.83,v58.61,427.31 on  10/27/2006 Previous INR Goal Range:  2.0-3.0 on  08/28/2007 Previous INR:  2.2 on  03/10/2009 Previous Coumadin Dose(mg):  2.5mg  on mon,wed,fri. 2mg  other days on  03/26/2008 Previous Regimen:  same on  01/03/2009 Previous Coagulation Comments:  Patient has an appointment on Dec. 5 and wants to know if he can come then for pt? on  02/16/2008  NEW REGIMEN & LAB RESULTS Current INR: 2.0 Regimen: same  Repeat testing in: 1 month  Anticoagulation Visit Questionnaire Coumadin dose missed/changed:  Yes Abnormal Bleeding Symptoms:  No  Any diet changes including alcohol intake, vegetables or greens since the  last visit:  No Any illnesses or hospitalizations since the last visit:  No Any signs of clotting since the last visit (including chest discomfort, dizziness, shortness of breath, arm tingling, slurred speech, swelling or redness in leg):  Yes  MEDICATIONS AMLODIPINE BESYLATE 5 MG TABS (AMLODIPINE BESYLATE) Take 1 tablet by mouth once a day SIMVASTATIN 40 MG TABS (SIMVASTATIN) Take one tablet at bedtime WARFARIN SODIUM 2.5 MG TABS (WARFARIN SODIUM) Take one on M-W-F or as directed WARFARIN SODIUM 2 MG TABS (WARFARIN SODIUM) Take one on Su-Tues-Thur-Sat or as directed RANITIDINE HCL 150 MG TABS (RANITIDINE HCL) once daily ALLOPURINOL 300 MG TABS (ALLOPURINOL) Take 1 tablet by mouth once  a day PREDNISONE 10 MG TABS (PREDNISONE) Take 1 tablet by mouth once a day x 5 days for gout flare    Laboratory Results   Blood Tests     PT: 17.3 s   (Normal Range: 10.6-13.4)  INR: 2.0   (Normal Range: 0.88-1.12   Therap INR: 2.0-3.5) Comments: Rita Ohara  April 07, 2009 10:55 AM

## 2010-04-23 NOTE — Progress Notes (Signed)
Summary: Pt req referral for caterac surgery  Phone Note Call from Patient Call back at Home Phone [phone]   Caller: Patient Summary of Call: Pt needs a referral to get caterac surgery at [facility].  Pt wants Dr. Cato Mulligan to call him  Initial call taken by: Lucy Antigua,  April 22, 2009 11:08 AM  Follow-up for Phone Call        he can make appt Follow-up by: Birdie Sons MD,  April 22, 2009 12:32 PM  Additional Follow-up for Phone Call Additional follow up Details #1::        I called pt and told him that he can sch his own appt as noted above.  Additional Follow-up by: Lucy Antigua,  April 22, 2009 1:32 PM

## 2010-04-23 NOTE — Assessment & Plan Note (Signed)
Summary: pt//ccm   Nurse Visit   Allergies: No Known Drug Allergies Laboratory Results   Blood Tests   Date/Time Received: August 20, 2009 10:35 AM  Date/Time Reported: August 20, 2009 10:35 AM    INR: 2.0   (Normal Range: 0.88-1.12   Therap INR: 2.0-3.5) Comments: Wynona Canes, CMA  August 20, 2009 10:35 AM     Orders Added: 1)  Est. Patient Level I [99211] 2)  Protime [95621HY]  Laboratory Results   Blood Tests      INR: 2.0   (Normal Range: 0.88-1.12   Therap INR: 2.0-3.5) Comments: Wynona Canes, CMA  August 20, 2009 10:35 AM       ANTICOAGULATION RECORD PREVIOUS REGIMEN & LAB RESULTS Anticoagulation Diagnosis:  v58.83,v58.61,427.31 on  10/27/2006 Previous INR Goal Range:  2.0-3.0 on  08/28/2007 Previous INR:  1.8 on  07/21/2009 Previous Coumadin Dose(mg):  2.5mg  on mon,wed,fri. 2mg  other days on  03/26/2008 Previous Regimen:  2mg . M, W, F 2.5mg . all other days on  07/21/2009 Previous Coagulation Comments:  Patient has an appointment on Dec. 5 and wants to know if he can come then for pt? on  02/16/2008  NEW REGIMEN & LAB RESULTS Current INR: 2.0 Current Coumadin Dose(mg): 2mg  on m,w,f 2.5mg  on other days Regimen: 2mg . M, W, F 2.5mg . all other days  (no change)       Repeat testing in: 4 weeks MEDICATIONS AMLODIPINE BESYLATE 5 MG TABS (AMLODIPINE BESYLATE) Take 1 tablet by mouth once a day SIMVASTATIN 40 MG TABS (SIMVASTATIN) Take one tablet at bedtime WARFARIN SODIUM 2.5 MG TABS (WARFARIN SODIUM) Take one on M-W-F or as directed WARFARIN SODIUM 2 MG TABS (WARFARIN SODIUM) Take one on Su-Tues-Thur-Sat or as directed RANITIDINE HCL 150 MG TABS (RANITIDINE HCL) once daily ALLOPURINOL 300 MG TABS (ALLOPURINOL) Take 1 tablet by mouth once a day PREDNISONE 10 MG TABS (PREDNISONE) 1/2 daily   Anticoagulation Visit Questionnaire      Coumadin dose missed/changed:  No      Abnormal Bleeding Symptoms:  No   Any diet changes including alcohol intake,  vegetables or greens since the last visit:  No Any illnesses or hospitalizations since the last visit:  No Any signs of clotting since the last visit (including chest discomfort, dizziness, shortness of breath, arm tingling, slurred speech, swelling or redness in leg):  No

## 2010-04-23 NOTE — Progress Notes (Signed)
Summary: REFILL allopurinol,simvastatin  Phone Note Refill Request Message from:  Fax from Pharmacy on August 27, 2009 9:45 AM  Refills Requested: Medication #1:  ALLOPURINOL 300 MG TABS Take 1 tablet by mouth once a day   Notes: [facility] - [address] [place], [place].  Medication #2:  SIMVASTATIN 40 MG TABS Take one tablet at bedtime   Notes: [facility] - [address] [place], [place].... (LOVASTATIN 40MG  TAB Take 1 tablet by mouth once daily)    Initial call taken by: Debbra Riding,  August 27, 2009 9:48 AM  Follow-up for Phone Call        see Rx. Follow-up by: Gladis Riffle, RN,  August 27, 2009 11:07 AM    Prescriptions: ALLOPURINOL 300 MG TABS (ALLOPURINOL) Take 1 tablet by mouth once a day  #30 x 6   Entered by:   Gladis Riffle, RN   Authorized by:   Birdie Sons MD   Signed by:   Gladis Riffle, RN on 08/27/2009   Method used:   Electronically to        [facility] (retail)       [address] [place], [place]  [address]       Ph: [phone]       Fax: [phone]   RxID:   [id number] SIMVASTATIN 40 MG TABS (SIMVASTATIN) Take one tablet at bedtime  #90 x 3   Entered by:   Gladis Riffle, RN   Authorized by:   Birdie Sons MD   Signed by:   Gladis Riffle, RN on 08/27/2009   Method used:   Electronically to        [facility]* (retail)       [address] [place], [place]  [address]       Ph: [phone]       Fax: [phone]   RxID:   [id number]

## 2010-04-23 NOTE — Assessment & Plan Note (Signed)
Summary: ROV/MM also protime/njr   Vital Signs:  Patient profile:   77 year old male Weight:      158 pounds Temp:     97.6 degrees F oral Pulse rate:   84 / minute Pulse rhythm:   regular Resp:     12 per minute BP sitting:   126 / 74  (left arm)  Vitals Entered By: Gladis Riffle, RN (June 20, 2009 11:08 AM) CC: rov to discuss prednisone Is Patient Diabetic? No   CC:  rov to discuss prednisone.  History of Present Illness:  Follow-Up Visit      This is a 77 year old man who presents for Follow-up visit.  The patient denies chest pain and palpitations.  Since the last visit the patient notes no new problems or concerns.  The patient reports taking meds as prescribed.  When questioned about possible medication side effects, the patient notes none.    All other systems reviewed and were negative   Preventive Screening-Counseling & Management  Alcohol-Tobacco     Smoking Status: quit > 6 months     Year Quit: 1970  Current Problems (verified): 1)  Anemia-nos  (ICD-285.9) 2)  Pulmonary Nodule, Right Upper Lobe  (ICD-518.89) 3)  Acut Duod Ulcer W/hemorr w/o Mention Obstruction  (ICD-532.00) 4)  Atrial Fibrillation  (ICD-427.31) 5)  Coumadin Therapy  (ICD-V58.61) 6)  Encounter For Therapeutic Drug Monitoring  (ICD-V58.83) 7)  Renal Insufficiency  (ICD-588.9) 8)  Cerebrovascular Accident, Hx of  (ICD-V12.50) 9)  Hypertension  (ICD-401.9) 10)  Hyperlipidemia  (ICD-272.4) 11)  Gout  (ICD-274.9)  Current Medications (verified): 1)  Amlodipine Besylate 5 Mg Tabs (Amlodipine Besylate) .... Take 1 Tablet By Mouth Once A Day 2)  Simvastatin 40 Mg Tabs (Simvastatin) .... Take One Tablet At Bedtime 3)  Warfarin Sodium 2.5 Mg Tabs (Warfarin Sodium) .... Take One On M-W-F or As Directed 4)  Warfarin Sodium 2 Mg Tabs (Warfarin Sodium) .... Take One On Su-Tues-Thur-Sat or As Directed 5)  Ranitidine Hcl 150 Mg Tabs (Ranitidine Hcl) .... Once Daily 6)  Allopurinol 300 Mg Tabs  (Allopurinol) .... Take 1 Tablet By Mouth Once A Day 7)  Prednisone 10 Mg Tabs (Prednisone) .... 4 Tabs Once Daily X 5days, 2 Tabs Once Daily X5days, Then 1 Once Daily  Allergies (verified): No Known Drug Allergies  Past History:  Past Medical History: Last updated: 03/26/2008 Gout Hyperlipidemia Hypertension Cerebrovascular accident, hx of colon polyps Renal insufficiency Anemia-NOS (? 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   Eyes:  pupils equal and pupils round.   Neck:  No deformities, masses, or tenderness noted. Chest Wall:  No deformities, masses, tenderness or gynecomastia noted. Lungs:  normal respiratory effort, no intercostal retractions, no accessory muscle use, and normal breath sounds.   Heart:  normal rate and regular rhythm.   Abdomen:  Bowel sounds positive,abdomen soft and non-tender without masses, organomegaly or hernias noted. Msk:  No deformity or scoliosis noted of thoracic or lumbar spine.     Neurologic:  cranial nerves II-XII intact and gait normal.     Impression & Recommendations:  Problem # 1:  ATRIAL FIBRILLATION (ICD-427.31) no sxs continue current  medications  His updated medication list for this problem includes:    Amlodipine Besylate 5 Mg Tabs (Amlodipine besylate) .Marland Kitchen... Take 1 tablet by mouth once a day    Warfarin Sodium 2.5 Mg Tabs (Warfarin sodium) .Marland Kitchen... Take one on m-w-f or as directed    Warfarin Sodium 2 Mg Tabs (Warfarin sodium) .Marland Kitchen... Take one on su-tues-thur-sat or as directed  Orders: Protime  (78469GE) Fingerstick (95284)  Problem # 2:  HYPERTENSION (ICD-401.9) controlled continue current medications  His updated medication list for this problem includes:    Amlodipine Besylate 5 Mg Tabs (Amlodipine besylate) .Marland Kitchen... Take 1 tablet by mouth once a day  BP today: 126/74 Prior BP: 152/80 (05/20/2009)  Prior 10 Yr Risk Heart Disease: 22 % (11/04/2006)  Labs Reviewed: K+: 5.1 (04/07/2009) Creat: : 1.3 (04/07/2009)   Chol: 182 (04/07/2009)   HDL: 49.30 (04/07/2009)   LDL: 112 (04/07/2009)   TG: 105.0 (04/07/2009)  Problem # 3:  HYPERLIPIDEMIA (ICD-272.4) check labs next visit His updated medication list for this problem includes:    Simvastatin 40 Mg Tabs (Simvastatin) .Marland Kitchen... Take one tablet at bedtime  Labs Reviewed: SGOT: 27 (04/07/2009)   SGPT: 20 (04/07/2009)  Lipid Goals: Chol Goal: 200 (11/04/2006)   HDL Goal: 40 (11/04/2006)   LDL Goal: 100 (11/04/2006)   TG Goal: 150 (11/04/2006)  Prior 10 Yr Risk Heart Disease: 22 % (11/04/2006)   HDL:49.30 (04/07/2009), 57.10 (09/13/2008)  LDL:112 (04/07/2009), 116 (03/26/2008)  Chol:182 (04/07/2009), 241 (09/13/2008)  Trig:105.0 (04/07/2009), 130.0 (09/13/2008)  Problem # 4:  CEREBROVASCULAR ACCIDENT, HX OF (ICD-V12.50) no recurrence risk factor modification  Problem # 5:  GOUT (ICD-274.9) no recurrence low dose prednisone His updated medication list for this problem includes:    Allopurinol 300 Mg Tabs (Allopurinol) .Marland Kitchen... Take 1 tablet by mouth once a day  Complete Medication List: 1)  Amlodipine Besylate 5 Mg Tabs (Amlodipine besylate) .... Take 1 tablet by mouth once a day 2)  Simvastatin 40 Mg Tabs (Simvastatin) .... Take one tablet at bedtime 3)  Warfarin Sodium 2.5 Mg Tabs (Warfarin sodium) .... Take one on m-w-f or as directed 4)  Warfarin Sodium 2 Mg Tabs (Warfarin sodium) .... Take one on su-tues-thur-sat or as directed 5)  Ranitidine Hcl 150 Mg Tabs (Ranitidine hcl) .... Once daily 6)  Allopurinol 300 Mg Tabs  (Allopurinol) .... Take 1 tablet by mouth once a day 7)  Prednisone 10 Mg Tabs (Prednisone) .... 1/2 daily  Patient Instructions: 1)  Please schedule a follow-up appointment in 3 months. 2)  schedule protime in one month  Laboratory Results   Blood Tests   Date/Time Recieved: June 20, 2009 11:06 AM  Date/Time Reported: June 20, 2009 11:06 AM   PT: 16.8 s   (Normal Range: 10.6-13.4)  INR: 1.9   (Normal Range: 0.88-1.12   Therap INR: 2.0-3.5) Comments: Wynona Canes, CMA  June 20, 2009 11:07 AM        ANTICOAGULATION RECORD PREVIOUS REGIMEN & LAB RESULTS Anticoagulation Diagnosis:  v58.83,v58.61,427.31 on  10/27/2006 Previous INR Goal Range:  2.0-3.0 on  08/28/2007 Previous INR:  3.8 on  05/20/2009 Previous Coumadin Dose(mg):  2.5mg  on mon,wed,fri. 2mg  other days on  03/26/2008 Previous Regimen:  2mg  qd on  05/20/2009 Previous Coagulation Comments:  Patient has an appointment on Dec. 5 and wants to know if he can come then for pt? on  02/16/2008  NEW REGIMEN & LAB RESULTS Current INR: 1.9 Regimen: 2mg  qd  (no change)  MEDICATIONS  AMLODIPINE BESYLATE 5 MG TABS (AMLODIPINE BESYLATE) Take 1 tablet by mouth once a day SIMVASTATIN 40 MG TABS (SIMVASTATIN) Take one tablet at bedtime WARFARIN SODIUM 2.5 MG TABS (WARFARIN SODIUM) Take one on M-W-F or as directed WARFARIN SODIUM 2 MG TABS (WARFARIN SODIUM) Take one on Su-Tues-Thur-Sat or as directed RANITIDINE HCL 150 MG TABS (RANITIDINE HCL) once daily ALLOPURINOL 300 MG TABS (ALLOPURINOL) Take 1 tablet by mouth once a day PREDNISONE 10 MG TABS (PREDNISONE) 1/2 daily   Anticoagulation Visit Questionnaire      Coumadin dose missed/changed:  No      Abnormal Bleeding Symptoms:  No   Any diet changes including alcohol intake, vegetables or greens since the last visit:  No Any illnesses or hospitalizations since the last visit:  No Any signs of clotting since the last visit (including chest discomfort, dizziness,  shortness of breath, arm tingling, slurred speech, swelling or redness in leg):  No

## 2010-04-23 NOTE — Assessment & Plan Note (Signed)
Summary: pt/Daniel [name]   Nurse Visit   Allergies: No Known Drug Allergies Laboratory Results   Blood Tests     PT: 16.5 s   (Normal Range: 10.6-13.4)  INR: 1.8   (Normal Range: 0.88-1.12   Therap INR: 2.0-3.5) Comments: Rita Ohara  Jul 21, 2009 10:52 AM     Orders Added: 1)  Est. Patient Level I [99211] 2)  Protime [[id number]]    ANTICOAGULATION RECORD PREVIOUS REGIMEN & LAB RESULTS Anticoagulation Diagnosis:  v58.83,v58.61,427.31 on  10/27/2006 Previous INR Goal Range:  2.0-3.0 on  08/28/2007 Previous INR:  1.9 on  06/20/2009 Previous Coumadin Dose(mg):  2.5mg  on mon,wed,fri. 2mg  other days on  03/26/2008 Previous Regimen:  2mg  qd on  05/20/2009 Previous Coagulation Comments:  Patient has an appointment on Dec. 5 and wants to know if he can come then for pt? on  02/16/2008  NEW REGIMEN & LAB RESULTS Current INR: 1.8 Regimen: 2mg . M, W, F 2.5mg . all other days  Repeat testing in: 1  Anticoagulation Visit Questionnaire Coumadin dose missed/changed:  No Abnormal Bleeding Symptoms:  No  Any diet changes including alcohol intake, vegetables or greens since the last visit:  No Any illnesses or hospitalizations since the last visit:  No Any signs of clotting since the last visit (including chest discomfort, dizziness, shortness of breath, arm tingling, slurred speech, swelling or redness in leg):  Yes  MEDICATIONS AMLODIPINE BESYLATE 5 MG TABS (AMLODIPINE BESYLATE) Take 1 tablet by mouth once a day SIMVASTATIN 40 MG TABS (SIMVASTATIN) Take one tablet at bedtime WARFARIN SODIUM 2.5 MG TABS (WARFARIN SODIUM) Take one on M-W-F or as directed WARFARIN SODIUM 2 MG TABS (WARFARIN SODIUM) Take one on Su-Tues-Thur-Sat or as directed RANITIDINE HCL 150 MG TABS (RANITIDINE HCL) once daily ALLOPURINOL 300 MG TABS (ALLOPURINOL) Take 1 tablet by mouth once a day PREDNISONE 10 MG TABS (PREDNISONE) 1/2 daily

## 2010-04-23 NOTE — Assessment & Plan Note (Signed)
Summary: 3 MO ROV/PT/MM also pt/njr   Vital Signs:  Patient profile:   77 year old male Weight:      159 pounds Temp:     98.2 degrees F oral Pulse rate:   88 / minute Pulse rhythm:   regular BP sitting:   116 / 74  (left arm) Cuff size:   regular  Vitals Entered By: Alfred Levins, CMA (April 08, 2010 10:45 AM) CC: f/u   CC:  f/u.  History of Present Illness:  Follow-Up Visit      This is a 77 year old man who presents for Follow-up visit.  The patient denies chest pain and palpitations.  Since the last visit the patient notes no new problems or concerns.  The patient reports taking meds as prescribed.  When questioned about possible medication side effects, the patient notes none.   no recurrent gout  All other systems reviewed and were negative   Current Medications (verified): 1)  Amlodipine Besylate 5 Mg Tabs (Amlodipine Besylate) .... Take 1 Tablet By Mouth Once A Day 2)  Simvastatin 40 Mg Tabs (Simvastatin) .... Take One Tablet At Bedtime 3)  Warfarin Sodium 2.5 Mg Tabs (Warfarin Sodium) .Marland Kitchen.. 1 By Mouth Once Daily 4)  Allopurinol 300 Mg Tabs (Allopurinol) .... Take 1 Tablet By Mouth Once A Day  Allergies (verified): No Known Drug Allergies  Past History:  Past Medical History: Last updated: 03/26/2008 Gout Hyperlipidemia Hypertension Cerebrovascular accident, hx of colon polyps Renal insufficiency Anemia-NOS (? 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 HEENT exam atraumatic, normocephalic, extraocular muscles are intact. Neck is supple. Chest clear to auscultation cardiac exam S1 and S2 are  irregular. Abdominal exam active bowel sounds, soft. Extremities no edema.   Impression & Recommendations:  Problem # 1:  ATRIAL FIBRILLATION (ICD-427.31) see protime  His updated medication list for this problem includes:    Amlodipine Besylate 5 Mg Tabs (Amlodipine besylate) .Marland Kitchen... Take 1 tablet by mouth once a day    Warfarin Sodium 2.5 Mg Tabs (Warfarin sodium) .Marland Kitchen... 1 by mouth once daily  Orders: Fingerstick (04540) Protime (98119JY)  Reviewed the following: PT: 16.5 (07/21/2009)   INR: 1.9 (04/08/2010) Next Protime: OV (dated on 04/08/2010)  Problem # 2:  HYPERTENSION (ICD-401.9) contrtolled continue current medications  His updated medication list for this problem includes:    Amlodipine Besylate 5 Mg Tabs (Amlodipine besylate) .Marland Kitchen... Take 1 tablet by mouth once a day  BP today: 116/74 Prior BP: 122/68 (01/05/2010)  Prior 10 Yr Risk Heart Disease: 22 % (11/04/2006)  Labs Reviewed: K+: 3.7 (09/24/2009) Creat: : 1.4 (09/24/2009)   Chol: 191 (09/24/2009)   HDL: 59.60 (09/24/2009)   LDL: 105 (09/24/2009)   TG: 131.0 (09/24/2009)  Problem # 3:  HYPERLIPIDEMIA (ICD-272.4)  His updated medication list for this problem includes:    Simvastatin 40 Mg Tabs (Simvastatin) .Marland Kitchen... Take one tablet at bedtime  Labs Reviewed: SGOT: 29 (09/24/2009)   SGPT: 29 (09/24/2009)  Lipid Goals: Chol Goal: 200 (11/04/2006)   HDL Goal: 40 (11/04/2006)   LDL Goal: 100 (11/04/2006)   TG Goal: 150 (11/04/2006)  Prior 10 Yr Risk Heart Disease: 22 % (11/04/2006)   HDL:59.60 (09/24/2009), 49.30 (  04/07/2009)  LDL:105 (09/24/2009), 112 (04/07/2009)  Chol:191 (09/24/2009), 182 (04/07/2009)  Trig:131.0 (09/24/2009), 105.0 (04/07/2009)  Problem # 4:  GOUT (ICD-274.9) no recurrence His updated medication list for this problem includes:    Allopurinol 300 Mg Tabs (Allopurinol) .Marland Kitchen... Take 1 tablet by mouth once a day  Complete Medication List: 1)  Amlodipine Besylate 5 Mg Tabs (Amlodipine besylate)  .... Take 1 tablet by mouth once a day 2)  Simvastatin 40 Mg Tabs (Simvastatin) .... Take one tablet at bedtime 3)  Warfarin Sodium 2.5 Mg Tabs (Warfarin sodium) .Marland Kitchen.. 1 by mouth once daily 4)  Allopurinol 300 Mg Tabs (Allopurinol) .... Take 1 tablet by mouth once a day  Patient Instructions: 1)  see me 3 months 2)  Pro Times every month or sooner if directed.    Orders Added: 1)  Fingerstick [36416] 2)  Protime [85610QW] 3)  Est. Patient Level IV [[address]]    Laboratory Results   Blood Tests   Date/Time Recieved: April 08, 2010 10:36 AM  Date/Time Reported: April 08, 2010 10:36 AM    INR: 1.9   (Normal Range: 0.88-1.12   Therap INR: 2.0-3.5) Comments: Wynona Canes, CMA  April 08, 2010 10:36 AM       ANTICOAGULATION RECORD PREVIOUS REGIMEN & LAB RESULTS Anticoagulation Diagnosis:  v58.83,v58.61,427.31 on  10/27/2006 Previous INR Goal Range:  2.0-3.0 on  08/28/2007 Previous INR:  2.5 on  03/18/2010 Previous Coumadin Dose(mg):  2mg  on m,w,f 2.5mg  on other days on  08/20/2009 Previous Regimen:  same on  03/18/2010 Previous Coagulation Comments:  Patient has an appointment on Dec. 5 and wants to know if he can come then for pt? on  02/16/2008  NEW REGIMEN & LAB RESULTS Current INR: 1.9 Current Coumadin Dose(mg): 2.5mg  qd Regimen: same  (no change)       Repeat testing in: OV MEDICATIONS AMLODIPINE BESYLATE 5 MG TABS (AMLODIPINE BESYLATE) Take 1 tablet by mouth once a day SIMVASTATIN 40 MG TABS (SIMVASTATIN) Take one tablet at bedtime WARFARIN SODIUM 2.5 MG TABS (WARFARIN SODIUM) 1 by mouth once daily ALLOPURINOL 300 MG TABS (ALLOPURINOL) Take 1 tablet by mouth once a day   Anticoagulation Visit Questionnaire      Coumadin dose missed/changed:  Yes      Coumadin Dose Comments:  one or more missed dose(s)      Abnormal Bleeding Symptoms:  No   Any diet changes including alcohol intake, vegetables or greens since the last visit:  No Any illnesses or  hospitalizations since the last visit:  No Any signs of clotting since the last visit (including chest discomfort, dizziness, shortness of breath, arm tingling, slurred speech, swelling or redness in leg):  No

## 2010-04-23 NOTE — Progress Notes (Signed)
Summary: gout flare  Phone Note Call from Patient   Caller: Patient Call For: Birdie Sons MD Summary of Call: requests refill of prednisone for gout flare.  [facility].  Has taken 5 day course of prednisone x 2 in Jan already.  Do you want to refill? Initial call taken by: Gladis Riffle, RN,  April 18, 2009 1:27 PM  Follow-up for Phone Call        prednisone 10 mg 2 by mouth once daily for 7 days and then 1.5 daily for seven days than 1 by mouth once daily until I see him.  see me 3 weeks or so Follow-up by: Birdie Sons MD,  April 18, 2009 2:17 PM  Additional Follow-up for Phone Call Additional follow up Details #1::        Patient notified. appt made for 05/20/09.  Rx done elctronically. Additional Follow-up by: Gladis Riffle, RN,  April 18, 2009 2:47 PM    New/Updated Medications: PREDNISONE 10 MG TABS (PREDNISONE) Take 2 tablets by mouth once a day   x 7 days, then 1 1/2 daily x 7 days, then 1 daily until sees MD on 05/20/09 Prescriptions: PREDNISONE 10 MG TABS (PREDNISONE) Take 2 tablets by mouth once a day   x 7 days, then 1 1/2 daily x 7 days, then 1 daily until sees MD on 05/20/09  #50 x 0   Entered by:   Gladis Riffle, RN   Authorized by:   Birdie Sons MD   Signed by:   Gladis Riffle, RN on 04/18/2009   Method used:   Electronically to        [facility]* (retail)       [address] [place], [place]  [address]       Ph: [phone]       Fax: [phone]   RxID:   [id number]

## 2010-04-23 NOTE — Progress Notes (Signed)
Summary: gout flare  Phone Note Call from Patient Call back at Home Phone [phone]   Caller: Spouse Call For: Daniel Sons MD Summary of Call: having a gout flare again; on prednisone taper, can hardly walk. What can he do?  HT/Francis King Initial call taken by: Raechel Ache, RN,  May 29, 2009 1:21 PM  Follow-up for Phone Call        increase prednisone 40 mg by mouth once daily for 5 days then 20 mg by mouth for 5 days then 10 mg---see me 2 weeks (on 10 mg prednisone) Follow-up by: Daniel Sons MD,  May 29, 2009 1:36 PM  Additional Follow-up for Phone Call Additional follow up Details #1::        notified. Additional Follow-up by: Raechel Ache, RN,  May 29, 2009 1:53 PM    New/Updated Medications: PREDNISONE 10 MG TABS (PREDNISONE) 4 tabs once daily x 5days, 2 tabs once daily x5days, then 1 once daily Prescriptions: PREDNISONE 10 MG TABS (PREDNISONE) 4 tabs once daily x 5days, 2 tabs once daily x5days, then 1 once daily  #60 x 1   Entered by:   Raechel Ache, RN   Authorized by:   Daniel Sons MD   Signed by:   Raechel Ache, RN on 05/29/2009   Method used:   Electronically to        [facility]* (retail)       [address] [place], [place]  [address]       Ph: [phone]       Fax: [phone]   RxID:   [id number]

## 2010-04-23 NOTE — Assessment & Plan Note (Signed)
Summary: pt/Daniel [name]   Nurse Visit   Allergies: No Known Drug Allergies Laboratory Results   Blood Tests      INR: 2.2   (Normal Range: 0.88-1.12   Therap INR: 2.0-3.5) Comments: Rita Ohara  February 16, 2010 10:46 AM     Orders Added: 1)  Est. Patient Level I [99211] 2)  Protime [[address]UE]   ANTICOAGULATION RECORD PREVIOUS REGIMEN & LAB RESULTS Anticoagulation Diagnosis:  v58.83,v58.61,427.31 on  10/27/2006 Previous INR Goal Range:  2.0-3.0 on  08/28/2007 Previous INR:  1.5 on  02/06/2010 Previous Coumadin Dose(mg):  2mg  on m,w,f 2.5mg  on other days on  08/20/2009 Previous Regimen:  2.5mg  qd on  02/06/2010 Previous Coagulation Comments:  Patient has an appointment on Dec. 5 and wants to know if he can come then for pt? on  02/16/2008  NEW REGIMEN & LAB RESULTS Current INR: 2.2 Regimen: same  Repeat testing in: 4 weeks  Anticoagulation Visit Questionnaire Coumadin dose missed/changed:  No Abnormal Bleeding Symptoms:  No  Any diet changes including alcohol intake, vegetables or greens since the last visit:  No Any illnesses or hospitalizations since the last visit:  No Any signs of clotting since the last visit (including chest discomfort, dizziness, shortness of breath, arm tingling, slurred speech, swelling or redness in leg):  No  MEDICATIONS AMLODIPINE BESYLATE 5 MG TABS (AMLODIPINE BESYLATE) Take 1 tablet by mouth once a day SIMVASTATIN 40 MG TABS (SIMVASTATIN) Take one tablet at bedtime WARFARIN SODIUM 2.5 MG TABS (WARFARIN SODIUM) Take one on M-W-F or as directed WARFARIN SODIUM 2 MG TABS (WARFARIN SODIUM) Take one on Su-Tues-Thur-Sat or as directed ALLOPURINOL 300 MG TABS (ALLOPURINOL) Take 1 tablet by mouth once a day TERBINAFINE HCL 250 MG TABS (TERBINAFINE HCL) Take 1 tablet by mouth once a day for 14 days

## 2010-04-23 NOTE — Assessment & Plan Note (Signed)
Summary: 3 month rov/njr/pt rsc from bmp/cjr also pt/njr   Vital Signs:  Patient profile:   77 year old male Weight:      163 pounds Temp:     97.8 degrees F oral Pulse rate:   96 / minute Pulse rhythm:   regular Resp:     12 per minute BP sitting:   118 / 68  (left arm) Cuff size:   regular  Vitals Entered By: Gladis Riffle, RN (September 24, 2009 9:58 AM) CC: 3 month rov Is Patient Diabetic? No   CC:  3 month rov.  History of Present Illness:  Follow-Up Visit      This is a 77 year old man who presents for Follow-up visit.  The patient denies chest pain and palpitations.  Since the last visit the patient notes no new problems or concerns.  The patient reports taking meds as prescribed.  When questioned about possible medication side effects, the patient notes none.   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 Take 1 Tablet By Mouth Once A Day 2)  Simvastatin 40 Mg Tabs (Simvastatin) .... Take One Tablet At Bedtime 3)  Warfarin Sodium 2.5 Mg Tabs (Warfarin Sodium) .... Take One On M-W-F or As Directed 4)  Warfarin Sodium 2 Mg Tabs (Warfarin Sodium) .... Take One On Su-Tues-Thur-Sat or As Directed 5)  Ranitidine Hcl 150 Mg Tabs (Ranitidine Hcl) ....  Once Daily 6)  Allopurinol 300 Mg Tabs (Allopurinol) .... Take 1 Tablet By Mouth Once A Day 7)  Prednisone 10 Mg Tabs (Prednisone) .... 1/2 Daily  Allergies (verified): No Known Drug Allergies  Past History:  Past Medical History: Last updated: 03/26/2008 Gout Hyperlipidemia Hypertension Cerebrovascular accident, hx of colon polyps Renal insufficiency Anemia-NOS (? 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   Head:  normocephalic and atraumatic.   Eyes:  pupils equal and pupils round.   Ears:  R ear normal and L ear normal.   Neck:  No deformities, masses, or tenderness noted. Chest Wall:  No deformities, masses, tenderness or gynecomastia noted. Lungs:  normal respiratory effort, no intercostal retractions, no accessory muscle use, and normal breath sounds.   Heart:  normal rate and regular rhythm.   Abdomen:  Bowel sounds positive,abdomen soft and non-tender without masses, organomegaly or hernias noted. Msk:  No deformity or scoliosis noted of thoracic or lumbar spine.     Neurologic:  cranial nerves II-XII intact and gait normal.     Impression & Recommendations:  Problem # 1:  GOUT (  ICD-274.9)  no recurrence taper prednisone His updated medication list for this problem includes:    Allopurinol 300 Mg Tabs (Allopurinol) .Marland Kitchen... Take 1 tablet by mouth once a day  Orders: Venipuncture (60454) TLB-Uric Acid, Blood (84550-URIC)  Problem # 2:  HYPERLIPIDEMIA (ICD-272.4)  controlled continue current medications  His updated medication list for this problem includes:    Simvastatin 40 Mg Tabs (Simvastatin) .Marland Kitchen... Take one tablet at  bedtime  Labs Reviewed: SGOT: 27 (04/07/2009)   SGPT: 20 (04/07/2009)  Lipid Goals: Chol Goal: 200 (11/04/2006)   HDL Goal: 40 (11/04/2006)   LDL Goal: 100 (11/04/2006)   TG Goal: 150 (11/04/2006)  Prior 10 Yr Risk Heart Disease: 22 % (11/04/2006)   HDL:49.30 (04/07/2009), 57.10 (09/13/2008)  LDL:112 (04/07/2009), 116 (03/26/2008)  Chol:182 (04/07/2009), 241 (09/13/2008)  Trig:105.0 (04/07/2009), 130.0 (09/13/2008)  Orders: TLB-Lipid Panel (80061-LIPID) TLB-Hepatic/Liver Function Pnl (80076-HEPATIC) TLB-TSH (Thyroid Stimulating Hormone) (84443-TSH)  Problem # 3:  ATRIAL FIBRILLATION (ICD-427.31) protime today His updated medication list for this problem includes:    Amlodipine Besylate 5 Mg Tabs (Amlodipine besylate) .Marland Kitchen... Take 1 tablet by mouth once a day    Warfarin Sodium 2.5 Mg Tabs (Warfarin sodium) .Marland Kitchen... Take one on m-w-f or as directed    Warfarin Sodium 2 Mg Tabs (Warfarin sodium) .Marland Kitchen... Take one on su-tues-thur-sat or as directed  Orders: Protime (09811BJ) Fingerstick (47829)  Problem # 4:  RENAL INSUFFICIENCY (ICD-588.9)  check labs today  Orders: TLB-CBC Platelet - w/Differential (85025-CBCD)  Complete Medication List: 1)  Amlodipine Besylate 5 Mg Tabs (Amlodipine besylate) .... Take 1 tablet by mouth once a day 2)  Simvastatin 40 Mg Tabs (Simvastatin) .... Take one tablet at bedtime 3)  Warfarin Sodium 2.5 Mg Tabs (Warfarin sodium) .... Take one on m-w-f or as directed 4)  Warfarin Sodium 2 Mg Tabs (Warfarin sodium) .... Take one on su-tues-thur-sat or as directed 5)  Ranitidine Hcl 150 Mg Tabs (Ranitidine hcl) .... Once daily 6)  Allopurinol 300 Mg Tabs (Allopurinol) .... Take 1 tablet by mouth once a day 7)  Prednisone 2.5 Mg Tabs (Prednisone) .... Take 1 tablet by mouth once a day  Other Orders: TLB-BMP (Basic Metabolic Panel-BMET) (80048-METABOL)  Patient Instructions: 1)  see me 6 weeks Prescriptions: PREDNISONE 2.5 MG TABS (PREDNISONE) Take 1 tablet  by mouth once a day  #60 x 1   Entered and Authorized by:   Birdie Sons MD   Signed by:   Birdie Sons MD on 09/24/2009   Method used:   Electronically to        [facility]* (retail)       [address] [place], [place]  [address]       Ph: [phone]       Fax: [phone]   RxID:   [id number]   Laboratory Results   Blood Tests   Date/Time Recieved: September 24, 2009 9:56 AM  Date/Time Reported: September 24, 2009 9:56 AM    INR: 2.8   (Normal Range: 0.88-1.12   Therap INR: 2.0-3.5) Comments: Wynona Canes, CMA  September 24, 2009 9:56 AM       ANTICOAGULATION RECORD PREVIOUS REGIMEN & LAB RESULTS Anticoagulation Diagnosis:  v58.83,v58.61,427.31 on  10/27/2006 Previous INR Goal Range:  2.0-3.0 on  08/28/2007 Previous INR:  2.0 on  08/20/2009 Previous Coumadin Dose(mg):  2mg  on m,w,f 2.5mg  on other days on  08/20/2009  Previous Regimen:  2mg . M, W, F 2.5mg . all other days on  07/21/2009 Previous Coagulation Comments:  Patient has an appointment on Dec. 5 and wants to know if he can come then for pt? on  02/16/2008  NEW REGIMEN & LAB RESULTS Current INR: 2.8 Regimen: Same Dose       Repeat testing in: 4 weeks MEDICATIONS AMLODIPINE BESYLATE 5 MG TABS (AMLODIPINE BESYLATE) Take 1 tablet by mouth once a day SIMVASTATIN 40 MG TABS (SIMVASTATIN) Take one tablet at bedtime WARFARIN SODIUM 2.5 MG TABS (WARFARIN SODIUM) Take one on M-W-F or as directed WARFARIN SODIUM 2 MG TABS (WARFARIN SODIUM) Take one on Su-Tues-Thur-Sat or as directed RANITIDINE HCL 150 MG TABS (RANITIDINE HCL) once daily ALLOPURINOL 300 MG TABS (ALLOPURINOL) Take 1 tablet by mouth once a day PREDNISONE 2.5 MG TABS (PREDNISONE) Take 1 tablet by mouth once a day   Anticoagulation Visit Questionnaire      Coumadin dose missed/changed:  No      Abnormal Bleeding Symptoms:  No   Any diet changes including alcohol intake, vegetables or greens since  the last visit:  No Any illnesses or hospitalizations since the last visit:  No Any signs of clotting since the last visit (including chest discomfort, dizziness, shortness of breath, arm tingling, slurred speech, swelling or redness in leg):  No

## 2010-04-23 NOTE — Assessment & Plan Note (Signed)
Summary: MED REACTION/PT/RCD   Vital Signs:  Patient profile:   77 year old male Weight:      160 pounds Temp:     97.6 degrees F oral BP sitting:   138 / 84  (left arm) Cuff size:   regular  Vitals Entered By: Kern Reap CMA Duncan Dull) (December 05, 2009 11:25 AM)  Contraindications/Deferment of Procedures/Staging:    Test/Procedure: FLU VAX    Reason for deferment: patient declined  CC: possible reaction to medication, Rash Is Patient Diabetic? No Pain Assessment Patient in pain? no        CC:  possible reaction to medication and Rash.  History of Present Illness:  Rash      This is a 77 year old man who presents with Rash.  The symptoms began 2 weeks ago.  The patient denies macules.  The rash is located on the groin.  The rash is worse with heat.  The patient denies the following symptoms: fever, headache, difficulty breathing, and abdominal pain.  The patient denies history of recent tick bite, recent antibiotic use, and new medication.    All other systems reviewed and were negative   Current Medications (verified): 1)  Amlodipine Besylate 5 Mg Tabs (Amlodipine Besylate) .... Take 1 Tablet By Mouth Once A Day 2)  Simvastatin 40 Mg Tabs (Simvastatin) .... Take One Tablet At Bedtime 3)  Warfarin Sodium 2.5 Mg Tabs (Warfarin Sodium) .... Take One On M-W-F or As Directed 4)  Warfarin Sodium 2 Mg Tabs (Warfarin Sodium) .... Take One On Su-Tues-Thur-Sat or As Directed 5)  Allopurinol 300 Mg Tabs (Allopurinol) .... Take 1 Tablet By Mouth Once A Day 6)  Prednisone 2.5 Mg Tabs (Prednisone) .Marland Kitchen.. 1 By Mouth Every Other Day Until September 12 and Then Stop 7)  Nystatin-Triamcinolone 100000-0.1 Unit/gm-% Crea (Nystatin-Triamcinolone) .... Apply Two Times A Day To Affected Area For 10 Days  Allergies (verified): No Known Drug Allergies  Past History:  Past Medical History: Last updated: 03/26/2008 Gout Hyperlipidemia Hypertension Cerebrovascular accident, hx of colon  polyps Renal insufficiency Anemia-NOS (? 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   Head:  normocephalic and atraumatic.   Eyes:  pupils equal and pupils round.   Neck:  No deformities, masses, or tenderness noted. Skin:  well demarcated rash around groin   Impression & Recommendations:  Problem # 1:  TINEA CRURIS (ICD-110.3) discussed see meds  side effec ts discussed  Complete Medication List: 1)  Amlodipine Besylate 5 Mg Tabs (Amlodipine besylate) .... Take 1 tablet by mouth once a day 2)  Simvastatin 40 Mg Tabs (Simvastatin) .... Take one tablet at bedtime 3)  Warfarin Sodium 2.5 Mg Tabs (Warfarin sodium) .... Take one on m-w-f or as directed 4)  Warfarin Sodium 2 Mg Tabs (Warfarin sodium) .... Take one on su-tues-thur-sat or as directed 5)  Allopurinol 300 Mg Tabs (Allopurinol) .... Take 1 tablet by mouth once a day 6)  Prednisone 2.5 Mg Tabs (Prednisone) .Marland Kitchen.. 1 by mouth every other day until september 12 and then stop 7)  Terbinafine Hcl 250 Mg Tabs (Terbinafine hcl) .... Take 1 tablet by mouth once a day for 14 days  Other Orders: Protime ([id number]) Fingerstick ([id number])  Patient Instructions: 1)  .  Prescriptions: TERBINAFINE HCL 250 MG TABS (TERBINAFINE HCL) Take 1 tablet by mouth once a day for 14 days  #14 x 0   Entered and Authorized by:   Birdie Sons MD   Signed by:   Birdie Sons MD on 12/05/2009   Method used:   Electronically to        [facility] (retail)       [address] [place], [place]  [address]       Ph: [phone]       Fax: [phone]   RxID:    [id number]    ANTICOAGULATION RECORD PREVIOUS REGIMEN & LAB RESULTS Anticoagulation Diagnosis:  v58.83,v58.61,427.31 on  10/27/2006 Previous INR Goal Range:  2.0-3.0 on  08/28/2007 Previous INR:  2.4 on  11/05/2009 Previous Coumadin Dose(mg):  2mg  on m,w,f 2.5mg  on other days on  08/20/2009 Previous Regimen:  same on  11/05/2009 Previous Coagulation Comments:  Patient has an appointment on Dec. 5 and wants to know if he can come then for pt? on  02/16/2008  NEW REGIMEN & LAB RESULTS Current INR: 2.3 Regimen: same  Repeat testing in: 4 weeks  Anticoagulation Visit Questionnaire Coumadin dose missed/changed:  Yes Abnormal Bleeding Symptoms:  No  Any diet changes including alcohol intake, vegetables or greens since the last visit:  No Any illnesses or hospitalizations since the last visit:  No Any signs of clotting since the last visit (including chest discomfort, dizziness, shortness of breath, arm tingling, slurred speech, swelling or redness in leg):  No  MEDICATIONS AMLODIPINE BESYLATE 5 MG TABS (AMLODIPINE BESYLATE) Take 1 tablet by mouth once a day SIMVASTATIN 40 MG TABS (SIMVASTATIN) Take one tablet at bedtime WARFARIN SODIUM 2.5 MG TABS (WARFARIN SODIUM) Take one on M-W-F or as directed WARFARIN SODIUM 2 MG TABS (WARFARIN SODIUM) Take one on Su-Tues-Thur-Sat or as directed ALLOPURINOL 300 MG TABS (ALLOPURINOL) Take 1 tablet by mouth once a day PREDNISONE 2.5 MG TABS (PREDNISONE) 1 by mouth every other day until September 12 and then stop TERBINAFINE HCL 250 MG TABS (TERBINAFINE HCL) Take 1 tablet by mouth once a day for 14 days    Laboratory Results   Blood Tests      INR: 2.3   (Normal Range: 0.88-1.12   Therap INR: 2.0-3.5) Comments: Rita Ohara  December 05, 2009 10:38 AM

## 2010-04-23 NOTE — Assessment & Plan Note (Signed)
Summary: 6 week fup-add pt//ccm   Vital Signs:  Patient profile:   77 year old male Weight:      161.5 pounds Temp:     98.2 degrees F oral Pulse rate:   84 / minute Pulse rhythm:   regular Resp:     12 per minute BP sitting:   116 / 64  (left arm) Cuff size:   regular  Vitals Entered By: Gladis Riffle, RN (November 05, 2009 9:58 AM) CC: 6 WEEK ROV, NO C/O GOUT--C/O RASH BETWEEN LEGS X 1 MONTH AND TESTICULAR GROWTH Is Patient Diabetic? No   CC:  6 WEEK ROV and NO C/O GOUT--C/O RASH BETWEEN LEGS X 1 MONTH AND TESTICULAR GROWTH.  History of Present Illness:  Follow-Up Visit      This is a 77 year old man who presents for Follow-up visit.  The patient denies chest pain and palpitations.  Since the last visit the patient notes no new problems or concerns except has rash between legs for several weeks, has also noted a testicular growth---he thinks r testicle--- not painful .  The patient reports taking meds as prescribed.  When questioned about possible medication side effects, the patient notes none.    Preventive Screening-Counseling & Management  Alcohol-Tobacco     Smoking Status: quit > 6 months     Year Quit: 1970  Current Problems (verified): 1)  Pulmonary Nodule, Right Upper Lobe  (ICD-518.89) 2)  Acut Duod Ulcer W/hemorr w/o Mention Obstruction  (ICD-532.00) 3)  Atrial Fibrillation  (ICD-427.31) 4)  Coumadin Therapy  (ICD-V58.61) 5)  Encounter For Therapeutic Drug Monitoring  (ICD-V58.83) 6)  Renal Insufficiency  (ICD-588.9) 7)  Cerebrovascular Accident, Hx of  (ICD-V12.50) 8)  Hypertension  (ICD-401.9) 9)  Hyperlipidemia  (ICD-272.4) 10)  Gout  (ICD-274.9)  Current Medications (verified): 1)  Amlodipine Besylate 5 Mg Tabs (Amlodipine Besylate) .... Take 1 Tablet By Mouth Once A Day 2)  Simvastatin 40 Mg Tabs (Simvastatin) .... Take One Tablet At Bedtime 3)  Warfarin Sodium 2.5 Mg Tabs (Warfarin Sodium) .... Take One On M-W-F or As Directed 4)  Warfarin Sodium 2 Mg  Tabs (Warfarin Sodium) .... Take One On Su-Tues-Thur-Sat or As Directed 5)  Allopurinol 300 Mg Tabs (Allopurinol) .... Take 1 Tablet By Mouth Once A Day 6)  Prednisone 2.5 Mg Tabs (Prednisone) .... Take 1 Tablet By Mouth Once A Day  Allergies (verified): No Known Drug Allergies  Past History:  Past Medical History: Last updated: 03/26/2008 Gout Hyperlipidemia Hypertension Cerebrovascular accident, hx of colon polyps Renal insufficiency Anemia-NOS (? 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   Head:  normocephalic and atraumatic.   Eyes:  pupils equal and pupils round.   Ears:  R ear normal and L ear normal.   Neck:  No deformities, masses, or tenderness noted. Heart:  normal rate, no gallop, and no rub.   Abdomen:  Bowel sounds positive,abdomen soft and non-tender without masses, organomegaly or hernias noted. Genitalia:  testicles normal has small cyst on scrotum---not inflamed Msk:  No deformity or scoliosis noted of thoracic or lumbar spine.   Neurologic:  cranial nerves II-XII intact.   Skin:  well circumscribed rash on groin Psych:  good eye contact and  not anxious appearing.     Impression & Recommendations:  Problem # 1:  GOUT (ICD-274.9) decrease prednisone continue allopurinol His updated medication list for this problem includes:    Allopurinol 300 Mg Tabs (Allopurinol) .Marland Kitchen... Take 1 tablet by mouth once a day  Problem # 2:  TINEA CRURIS (ICD-110.3) see meds could have been exacer bated by prednisone  Problem # 3:  ATRIAL FIBRILLATION (ICD-427.31)  His updated medication list for this problem includes:    Amlodipine  Besylate 5 Mg Tabs (Amlodipine besylate) .Marland Kitchen... Take 1 tablet by mouth once a day    Warfarin Sodium 2.5 Mg Tabs (Warfarin sodium) .Marland Kitchen... Take one on m-w-f or as directed    Warfarin Sodium 2 Mg Tabs (Warfarin sodium) .Marland Kitchen... Take one on su-tues-thur-sat or as directed  Orders: Protime ([id number]) Fingerstick ([id number])  Reviewed the following: PT: 16.5 (07/21/2009)   INR: 2.4 (11/05/2009) Next Protime: 4 weeks (dated on 11/05/2009)  Complete Medication List: 1)  Amlodipine Besylate 5 Mg Tabs (Amlodipine besylate) .... Take 1 tablet by mouth once a day 2)  Simvastatin 40 Mg Tabs (Simvastatin) .... Take one tablet at bedtime 3)  Warfarin Sodium 2.5 Mg Tabs (Warfarin sodium) .... Take one on m-w-f or as directed 4)  Warfarin Sodium 2 Mg Tabs (Warfarin sodium) .... Take one on su-tues-thur-sat or as directed 5)  Allopurinol 300 Mg Tabs (Allopurinol) .... Take 1 tablet by mouth once a day 6)  Prednisone 2.5 Mg Tabs (Prednisone) .Marland Kitchen.. 1 by mouth every other day until september 12 and then stop 7)  Nystatin-triamcinolone 100000-0.1 Unit/gm-% Crea (Nystatin-triamcinolone) .... Apply two times a day to affected area for 10 days  Patient Instructions: 1)  see me 3 months 2)  schedule monthly protime Prescriptions: NYSTATIN-TRIAMCINOLONE 100000-0.1 UNIT/GM-% CREA (NYSTATIN-TRIAMCINOLONE) apply two times a day to affected area for 10 days  #30grams x 1   Entered and Authorized by:   Birdie Sons MD   Signed by:   Birdie Sons MD on 11/05/2009   Method used:   Electronically to        [facility]* (retail)       [address] [place], [place]  [address]       Ph: [phone]       Fax: [phone]   RxID:   [id number] PREDNISONE 2.5 MG TABS (PREDNISONE) 1 by mouth every other day until September 12 and then stop  #20 x 0   Entered and Authorized by:   Birdie Sons MD   Signed by:   Birdie Sons MD on 11/05/2009   Method used:    Electronically to        [facility]* (retail)       [address] [place], [place]  [address]       Ph: [phone]       Fax: [phone]   RxID:   [id number]    ANTICOAGULATION RECORD PREVIOUS REGIMEN & LAB RESULTS Anticoagulation Diagnosis:  v58.83,v58.61,427.31 on  10/27/2006 Previous INR Goal Range:  2.0-3.0 on  08/28/2007 Previous INR:  2.8 on  09/24/2009 Previous Coumadin Dose(mg):  2mg  on m,w,f 2.5mg  on other days on  08/20/2009 Previous Regimen:  Same Dose on  09/24/2009 Previous Coagulation Comments:  Patient has an appointment on Dec. 5 and wants to know if he  can come then for pt? on  02/16/2008  NEW REGIMEN & LAB RESULTS Current INR: 2.4 Regimen: same  Repeat testing in: 4 weeks  Anticoagulation Visit Questionnaire Coumadin dose missed/changed:  Yes Coumadin Dose Comments:  one or more missed dose(s) Abnormal Bleeding Symptoms:  No  Any diet changes including alcohol intake, vegetables or greens since the last visit:  No Any illnesses or hospitalizations since the last visit:  No Any signs of clotting since the last visit (including chest discomfort, dizziness, shortness of breath, arm tingling, slurred speech, swelling or redness in leg):  No  MEDICATIONS AMLODIPINE BESYLATE 5 MG TABS (AMLODIPINE BESYLATE) Take 1 tablet by mouth once a day SIMVASTATIN 40 MG TABS (SIMVASTATIN) Take one tablet at bedtime WARFARIN SODIUM 2.5 MG TABS (WARFARIN SODIUM) Take one on M-W-F or as directed WARFARIN SODIUM 2 MG TABS (WARFARIN SODIUM) Take one on Su-Tues-Thur-Sat or as directed ALLOPURINOL 300 MG TABS (ALLOPURINOL) Take 1 tablet by mouth once a day PREDNISONE 2.5 MG TABS (PREDNISONE) 1 by mouth every other day until September 12 and then stop NYSTATIN-TRIAMCINOLONE 100000-0.1 UNIT/GM-% CREA (NYSTATIN-TRIAMCINOLONE) apply two times a day to affected area for 10 days    Laboratory Results   Blood Tests       INR: 2.4   (Normal Range: 0.88-1.12   Therap INR: 2.0-3.5) Comments: Rita Ohara  November 05, 2009 9:31 AM

## 2010-05-13 ENCOUNTER — Other Ambulatory Visit (INDEPENDENT_AMBULATORY_CARE_PROVIDER_SITE_OTHER): Payer: PRIVATE HEALTH INSURANCE | Admitting: Internal Medicine

## 2010-05-13 DIAGNOSIS — I4891 Unspecified atrial fibrillation: Secondary | ICD-10-CM

## 2010-05-13 NOTE — Patient Instructions (Signed)
  Latest dosing instructions   Total Sun Mon Tue Wed Thu Fri Sat   17.5 2.5 mg 2.5 mg 2.5 mg 2.5 mg 2.5 mg 2.5 mg 2.5 mg    (2.5 mg1) (2.5 mg1) (2.5 mg1) (2.5 mg1) (2.5 mg1) (2.5 mg1) (2.5 mg1)

## 2010-06-10 ENCOUNTER — Ambulatory Visit (INDEPENDENT_AMBULATORY_CARE_PROVIDER_SITE_OTHER): Payer: PRIVATE HEALTH INSURANCE | Admitting: Internal Medicine

## 2010-06-10 DIAGNOSIS — I4891 Unspecified atrial fibrillation: Secondary | ICD-10-CM

## 2010-06-10 NOTE — Patient Instructions (Signed)
Same dose 

## 2010-07-07 ENCOUNTER — Encounter: Payer: Self-pay | Admitting: Internal Medicine

## 2010-07-08 ENCOUNTER — Ambulatory Visit (INDEPENDENT_AMBULATORY_CARE_PROVIDER_SITE_OTHER): Payer: PRIVATE HEALTH INSURANCE | Admitting: Internal Medicine

## 2010-07-08 ENCOUNTER — Encounter: Payer: Self-pay | Admitting: Internal Medicine

## 2010-07-08 DIAGNOSIS — M109 Gout, unspecified: Secondary | ICD-10-CM

## 2010-07-08 DIAGNOSIS — E785 Hyperlipidemia, unspecified: Secondary | ICD-10-CM

## 2010-07-08 DIAGNOSIS — I4891 Unspecified atrial fibrillation: Secondary | ICD-10-CM

## 2010-07-08 DIAGNOSIS — I10 Essential (primary) hypertension: Secondary | ICD-10-CM

## 2010-07-08 LAB — HEPATIC FUNCTION PANEL
ALT: 27 U/L (ref 0–53)
AST: 37 U/L (ref 0–37)
Albumin: 3.8 g/dL (ref 3.5–5.2)
Alkaline Phosphatase: 130 U/L — ABNORMAL HIGH (ref 39–117)
Bilirubin, Direct: 0.1 mg/dL (ref 0.0–0.3)
Total Bilirubin: 0.4 mg/dL (ref 0.3–1.2)
Total Protein: 7.7 g/dL (ref 6.0–8.3)

## 2010-07-08 LAB — BASIC METABOLIC PANEL
BUN: 25 mg/dL — ABNORMAL HIGH (ref 6–23)
CO2: 26 mEq/L (ref 19–32)
Calcium: 9.2 mg/dL (ref 8.4–10.5)
Chloride: 103 mEq/L (ref 96–112)
Creatinine, Ser: 1.6 mg/dL — ABNORMAL HIGH (ref 0.4–1.5)
GFR: 45.25 mL/min — ABNORMAL LOW (ref >60.00)
Glucose, Bld: 72 mg/dL (ref 70–99)
Potassium: 4.2 mEq/L (ref 3.5–5.1)
Sodium: 140 mEq/L (ref 135–145)

## 2010-07-08 LAB — POCT INR: INR: 2.4

## 2010-07-08 LAB — LIPID PANEL
Cholesterol: 162 mg/dL (ref 0–200)
HDL: 44.3 mg/dL (ref >39.00)
Triglycerides: 136 mg/dL (ref 0.0–149.0)
VLDL: 27.2 mg/dL (ref 0.0–40.0)

## 2010-07-08 NOTE — Assessment & Plan Note (Signed)
Needs f/u Check labs today 

## 2010-07-08 NOTE — Patient Instructions (Signed)
Same dose of coumadin 

## 2010-07-08 NOTE — Assessment & Plan Note (Signed)
No known complications Tolerating warfarin Continue same meds

## 2010-07-08 NOTE — Assessment & Plan Note (Signed)
Adequate control Continue same meds 

## 2010-07-08 NOTE — Assessment & Plan Note (Signed)
Continue same meds No recurrence

## 2010-07-13 NOTE — Progress Notes (Signed)
  Subjective:    Patient ID: Daniel [name], male    DOB: 07/09/1932, 77 y.o.   MRN: [id number]  HPI  Patient is here for followup of multiple medical problems including hypertension, atrial fibrillation, hyperlipidemia gout. Patient does not check his blood pressures at home but is compliant with medications. Patient takes warfarin for stroke prophylaxis due to atrial fibrillation. No bleeding complications. Patient is tolerating simvastatin without difficulty. Patient has not had any recurrent episodes of gout.  Past Medical History  Diagnosis Date  . Gout   . Hyperlipidemia   . Hypertension   . Stroke   . Colon polyps   . Renal insufficiency   . Anemia    Past Surgical History  Procedure Date  . Cataract extraction     right eye    reports that he has quit smoking. He does not have any smokeless tobacco history on file. His alcohol and drug histories not on file. family history is not on file. No Known Allergies   Review of Systems     patient denies chest pain, shortness of breath, orthopnea. Denies lower extremity edema, abdominal pain, change in appetite, change in bowel movements. Patient denies rashes, musculoskeletal complaints. No other specific complaints in a complete review of systems.   Objective:   Physical Exam Well-developed male in no acute distress. HEENT exam atraumatic, normocephalic symmetrical muscles are intact. Neck is supple without lymphadenopathy or jugular venous distention. Chest without increased work of breathing. Clear to auscultation. Cardiac exam without murmurs or gallops. Abdominal exam active bowel sounds, soft, nontender. Extremities there is no clubbing cyanosis or edema. Neurologic exam he is alert with a normal gait.       Assessment & Plan:

## 2010-08-03 ENCOUNTER — Ambulatory Visit (INDEPENDENT_AMBULATORY_CARE_PROVIDER_SITE_OTHER): Payer: PRIVATE HEALTH INSURANCE | Admitting: Internal Medicine

## 2010-08-03 DIAGNOSIS — I4891 Unspecified atrial fibrillation: Secondary | ICD-10-CM

## 2010-08-03 NOTE — Patient Instructions (Signed)
Same dose 

## 2010-08-07 NOTE — Discharge Summary (Signed)
Daniel [name]. [facility]  Patient:    Daniel [name], Daniel [name] Visit Number: [id number] MRN: [id number]          Service Type: MED Location: 3000 [id number] [id number] Attending Physician:  Enos Fling Dictated by:   Annie Main, N.P. Admit Date:  11/26/2000 Discharge Date: 11/29/2000   CC:         Daniel [name]. Alwyn Ren, M.D. [facility]   Discharge Summary  DATE OF BIRTH:  07/03/1932.  ADMISSION DIAGNOSES:  Left thalamic brain stem stroke.  DISCHARGE DIAGNOSES:  Left thalamic and mid brain stroke, probably embolic source.  ADDITIONAL DIAGNOSES: 1. Hypertension. 2. Hyperkalemia - resolved.  MEDICATIONS AT DISCHARGE: 1. Catapres TTS 0.1 mg/24 hour patch change every 3rd day. 2. Altace 10 mg 1 p.o. q.d. 3. Aspirin 81 mg 1 p.o. q.d. 4. Coumadin 7.5 mg q.d. x 2 days then 5 mg q.d. and then as directed.  ACTIVITY:  Be careful when you are ambulating, no driving until return visit then will reassess.  DIET:  Low fat, low salt.  WOUND CARE:  No applicable.  SPECIAL INSTRUCTIONS:  Patch your left eye most of the day.  If you are trying to read patch the right eye.  FOLLOWUP: 1. Coumadin clinic on [facility], Friday, December 02, 2000, at 12:30 p.m.,    please check into the office on the first floor at the desk. 2. Appointment with Dr. Alwyn Ren in 2 weeks. 3. Appointment with Demetrio Lapping, P.A. with [facility] on a day when Dr. Sharene Skeans is there in 2-3 weeks.  STUDIES PERFORMED: 1. CT of the head performed November 26, 2000, showed no acute infarct. 2. MRI of the brain performed November 26, 2000, showed acute/early subacute    infarct and cerebellar and mid superior cerebellar vermis plus left    thalamus, left mid brain, and left occipital lobe. 3. MRA of the brain November 26, 2000, showing intracranial atherosclerotic    changes suspicious for high grade stenosis of P1 segmented left PCA and    disease of P3 segment of left PCA. 4.  2-D echocardiogram performed November 28, 2000, showing an ejection    fraction of 55-65%, with no wall abnormalities and no embolic source;    aortic valve mildly calcified. 5. A carotid Doppler performed November 28, 2000, showing no right ICA    stenosis, left ICA stenosis 40-60% with bilateral ECA stenosis, vertebral    flow antegrade bilaterally. 6. EKG performed November 26, 2000, showing normal sinus rhythm.  LABORATORY DATA:  CBC remained stable throughout admission with white cells ranging from 12.1 to 12.4.  Differential done on November 29, 2000, was normal, except for a slightly elevated absolute lymphocytes at 4.3, and absolute monocytes at 1.0.  CMET done on admission had a low potassium of 3.2, and a glucose of 148.  CMET repeated on the day of discharge November 29, 2000, showed a normal potassium of 3.8, along with a chloride of 105, carbon dioxide of 28, BUN of 15, creatinine 1.1, and glucose of 100.  Homocystine was normal at 7.2, lipase was 28, PTT on the day of discharge was 63.  INR on November 26, 2000, was 1.1.  Lipase was 28 on November 29, 2000.  Lipid panel was not done during hospitalization but with his added blood work it should be available later.  HOSPITAL COURSE:  Mr. Daniel [name] is a 77 year old right-handed male who was in generally good health until November 26, 2000,  when he presented to the emergency room with acute onset of speech disturbance and talking nonsense ______.  He had been seen by Dr. Alwyn Ren several years ago and diagnosed with hypertension, but he refused any type of medications at that time.  The patient presented to the emergency department at [facility] and was transferred to [facility] [place] for stroke work-up and blood pressure control.  HIs blood pressure was 167/85 on admission.   MRI revealed acute left thalamic and mid brain strokes as well as bilateral cerebellar hemisphere strokes.  The patient was started on heparin  and it was felt that his acute stroke was probably an embolic source, however, no source of the embolus found. Discussed treatment options with the patient, wife and daughter and decided to treat with Coumadin.  The patient discharged on November 29, 2000 with plans to begin Coumadin this evening and followup in the Coumadin Clinic at [facility].  During hospitalization the patient had an episode of hypokalemia which was treated with 2 days with p.o. potassium and with potassium returning to normal on the day of discharge at 3.4.  On the day of discharge the patient still remained somewhat aphasic with difficulty with word recall.  He was able to follow commands but he was quite impulsive.  His right eye is unable to look vertically.  His left pupil is nonreactive.  He does have a left INO.  PLAN:  Go home with wife and daughter and continue with outpatient PT/OT and speech therapy.  Will need to followup on lipid profile. Dictated by:   Annie Main, N.P. Attending Physician:  Enos Fling DD:  11/29/00 TD:  11/29/00 Job: [id number]

## 2010-08-07 NOTE — Discharge Summary (Signed)
NAMESHAKEEM, STERN NO.:  [id number]   MEDICAL RECORD NO.:  [id number]          PATIENT TYPE:  INP   LOCATION:  1441                         FACILITY:  [facility]   PHYSICIAN:  Titus Dubin. Hopper, MD,FACP,FCCPDATE OF BIRTH:  03/08/1935   DATE OF ADMISSION:  11/22/2007  DATE OF DISCHARGE:  11/24/2007                               DISCHARGE SUMMARY   DISCHARGE DIAGNOSIS:  Acute gastrointestinal bleed due to duodenal ulcer  disease.   HISTORY/PHYSICAL:  For historical details , please see admission history  and physical.   LABORATORY DATA:  Initially, his hemoglobin and hematocrit were 12.7 and  37.9.  White count was 19,500 and platelet count 84,000.  There was  evidence of prerenal azotemia as well as renal insufficiency.  Specifically his creatinine was 2.65 and BUN 80.  INR at the time of  admission was 1.8.  Glucose was also elevated at 141,but his A1c was in  the nondiabetic range of 5.6.   After rehydration, his follow up creatinine was 2.12 and BUN 52.  Review  of the old chart indicates that the creatinine had been 1.8 in February  2009.  At discharge, his hemoglobin and hematocrit were 8.8 and 26.1.  The day prior to discharge, it had been 8.9 and 26.4 indicating a stable  anemia.  Follow up platelet count was 169,000  ,within normal limits.   An incidental finding during his workup was an 11 mm nodule in the right  upper lobe and questionable atelectasis versus a 14 mm nodule in the  left lower lobe.   Review of the chart indicates he has a past history of questionable  vocal cord polyp 4 decades ago.  He is a former smoker.  The patient was  informed of the nodule(s).  He was told that this could be monitored by  his primary care doctor, Dr. Birdie Sons as an outpatient.   The day of discharge, the patient was asymptomatic and anxious to be  discharged.  He was afebrile at 98.8, pulse was 88 and regular,  respiratory rate 16 and blood pressure  124/65.  On the day of discharge,  he denied chest pain or chest discomfort.  He denied any shortness of  breath or sputum production.  There had been question of some wheezing  noted intermittently.  Advair 100/50 one inhalation every 12 hours has  been prescribed.  He denied any abdominal pain, indigestion or nausea on  the day of discharge.   PHYSICAL EXAMINATION:  VITAL SIGNS:  As noted.  GENERAL:  He was clinically stable in no distress.  He was alert,  appropriate, cooperative.  Respiratory rate was normal.  HEENT:  He had no scleral icterus or jaundice.  CHEST:  Clear with no crackles or wheezes.  HEART:  He had a normal rhythm and rate with no gallops or neck vein  distention.  There was a grade 1 systolic murmur.  ABDOMEN:  Revealed normal bowel sounds.  Abdomen was soft and nontender.  No organomegaly, masses or hernias were noted.   DISCHARGE DIAGNOSES:  1. Acute duodenal ulcer  with hemorrhage without mention of      obstruction.  2. Pulmonary nodule right upper lobe and questionably left lower lobe.  3. Renal insufficiency, improved.  4. Past history of cerebrovascular accident.  5. Wheezing, possible asthmatic bronchitis as he has a history of      former smoking.   DISCHARGE MEDICATIONS:  Listed in the EMR and reconciled on the  discharge sheet.   DISCHARGE INSTRUCTIONS:  He was instructed in reflux triggers: ASA or  NSAIDS, alcohol, peppermint, tobacco and caffeine.  He was to elevate  the head of his bed on 12 inch blocks when sleeping.  He was asked to  drink to thirst up to 40 ounces a day to prevent dehydration and further  renal impairment.  He was to remain out of work until November 29, 2007.   FOLLOW UP:  1. CBC& dif,PT/INR,creat,BUN,& K+ were requested prior to 02/27/08 with      Dr. Cato Mulligan ,who will also monitor the pulmonary nodule(s).  2. He will also follow with Dr. Tenny Craw, his cardiologist.   DISCHARGE MEDICATIONS:  1. Prescription was provided for  the Advair discus.  2. Lisinopril 40 mg daily.  3. Lovastatin 40 mg daily.  4. Coumadin as per Dr. Marliss Coots office.  5. Colchicine 0.6 mg b.i.d. to q.i.d. as needed for gout.  6. He was also to be placed on iron supplementation to correct the      underlying anemia.   CONDITION ON DISCHARGE:  Discharge status is improved.   PROGNOSIS:  Appears good based on his clinical stability on the day of  discharge. Long term prognosis depends on preventing ulcer recurrence      Titus Dubin. Alwyn Ren, MD,FACP,FCCP  Electronically Signed     [facility]  D:  02/20/2008  T:  02/21/2008  Job:  [id number]   cc:   Valetta Mole. Swords, MD  [address] [place]  [place] [address]

## 2010-08-28 ENCOUNTER — Other Ambulatory Visit: Payer: Self-pay | Admitting: Internal Medicine

## 2010-08-28 DIAGNOSIS — I10 Essential (primary) hypertension: Secondary | ICD-10-CM

## 2010-08-28 NOTE — Telephone Encounter (Signed)
Pt called and said that Allopurinol, Amlodipine called in to [facility]

## 2010-08-31 MED ORDER — ALLOPURINOL 300 MG PO TABS: 300.0000 mg | ORAL_TABLET | Freq: Every day | ORAL | Status: DC

## 2010-08-31 MED ORDER — AMLODIPINE BESYLATE 5 MG PO TABS: 5.0000 mg | ORAL_TABLET | Freq: Every day | ORAL | Status: DC

## 2010-08-31 NOTE — Telephone Encounter (Signed)
rx sent in electronically 

## 2010-09-07 ENCOUNTER — Ambulatory Visit (INDEPENDENT_AMBULATORY_CARE_PROVIDER_SITE_OTHER): Payer: PRIVATE HEALTH INSURANCE | Admitting: Internal Medicine

## 2010-09-07 DIAGNOSIS — I4891 Unspecified atrial fibrillation: Secondary | ICD-10-CM

## 2010-09-07 DIAGNOSIS — Z7901 Long term (current) use of anticoagulants: Secondary | ICD-10-CM

## 2010-09-07 NOTE — Patient Instructions (Signed)
Same dose of coumadin 

## 2010-10-07 ENCOUNTER — Ambulatory Visit (INDEPENDENT_AMBULATORY_CARE_PROVIDER_SITE_OTHER): Payer: PRIVATE HEALTH INSURANCE

## 2010-10-07 DIAGNOSIS — I4891 Unspecified atrial fibrillation: Secondary | ICD-10-CM

## 2010-10-07 NOTE — Patient Instructions (Signed)
Same dose,2.5 mg everyday,check in 4 weeks  

## 2010-10-16 ENCOUNTER — Other Ambulatory Visit: Payer: Self-pay | Admitting: Internal Medicine

## 2010-11-09 ENCOUNTER — Encounter: Payer: Self-pay | Admitting: Internal Medicine

## 2010-11-09 ENCOUNTER — Ambulatory Visit (INDEPENDENT_AMBULATORY_CARE_PROVIDER_SITE_OTHER): Payer: PRIVATE HEALTH INSURANCE | Admitting: Internal Medicine

## 2010-11-09 ENCOUNTER — Ambulatory Visit: Payer: PRIVATE HEALTH INSURANCE | Admitting: Internal Medicine

## 2010-11-09 DIAGNOSIS — E785 Hyperlipidemia, unspecified: Secondary | ICD-10-CM

## 2010-11-09 DIAGNOSIS — I4891 Unspecified atrial fibrillation: Secondary | ICD-10-CM

## 2010-11-09 DIAGNOSIS — I10 Essential (primary) hypertension: Secondary | ICD-10-CM

## 2010-11-09 DIAGNOSIS — N259 Disorder resulting from impaired renal tubular function, unspecified: Secondary | ICD-10-CM

## 2010-11-09 LAB — BASIC METABOLIC PANEL
BUN: 25 mg/dL — ABNORMAL HIGH (ref 6–23)
Calcium: 9.1 mg/dL (ref 8.4–10.5)
Creatinine, Ser: 1.6 mg/dL — ABNORMAL HIGH (ref 0.4–1.5)
GFR: 46.56 mL/min — ABNORMAL LOW (ref >60.00)
Glucose, Bld: 104 mg/dL — ABNORMAL HIGH (ref 70–99)

## 2010-11-09 NOTE — Assessment & Plan Note (Signed)
Lab Results  Component Value Date   NA 140 07/08/2010   K 4.2 07/08/2010   CL 103 07/08/2010   CO2 26 07/08/2010   BUN 25* 07/08/2010   CREATININE 1.6* 07/08/2010    BP Readings from Last 3 Encounters:  11/09/10 114/74  07/08/10 132/70  04/08/10 116/74    Assessment: Hypertension control:  controlled  Progress toward goals:  at goal Barriers to meeting goals:  no barriers identified  Plan: Hypertension treatment:  continue current medications

## 2010-11-09 NOTE — Assessment & Plan Note (Signed)
Lab Results  Component Value Date   CHOL 162 07/08/2010   CHOL 191 09/24/2009   CHOL 182 04/07/2009   Lab Results  Component Value Date   HDL 44.30 07/08/2010   HDL 16.10 09/24/2009   HDL 49.30 04/07/2009   Lab Results  Component Value Date   LDLCALC 91 07/08/2010   LDLCALC 105* 09/24/2009   LDLCALC 112* 04/07/2009   Lab Results  Component Value Date   TRIG 136.0 07/08/2010   TRIG 131.0 09/24/2009   TRIG 105.0 04/07/2009   Lab Results  Component Value Date   CHOLHDL 4 07/08/2010   CHOLHDL 3 09/24/2009   CHOLHDL 4 04/07/2009   Lab Results  Component Value Date   LDLDIRECT 160.7 09/13/2008   Controlled Continue same meds

## 2010-11-09 NOTE — Progress Notes (Signed)
  Subjective:    Patient ID: Daniel [name], male    DOB: 1932/05/29, 77 y.o.   MRN: [id number]  HPI  Patient Active Problem List  Diagnoses  . HYPERLIPIDEMIA---tolerating meds  . GOUT---no ecurrence  . HYPERTENSION--no sxs, tolerating meds  . ATRIAL FIBRILLATION---on warfarin--no bleeding complications  . PULMONARY NODULE, RIGHT UPPER LOBE---reviewed most recent CT  . ACUT DUOD ULCER W/HEMORR W/O MENTION OBSTRUCTION  . RENAL INSUFFICIENCY---needs regular monitoring  . CEREBROVASCULAR ACCIDENT, HX OF   Past Medical History  Diagnosis Date  . Gout   . Hyperlipidemia   . Hypertension   . Stroke   . Colon polyps   . Renal insufficiency   . Anemia    Past Surgical History  Procedure Date  . Cataract extraction     right eye    reports that he has quit smoking. He does not have any smokeless tobacco history on file. His alcohol and drug histories not on file. family history is not on file. No Known Allergies   Review of Systems  patient denies chest pain, shortness of breath, orthopnea. Denies lower extremity edema, abdominal pain, change in appetite, change in bowel movements. Patient denies rashes, musculoskeletal complaints. No other specific complaints in a complete review of systems.      Objective:   Physical Exam  well-developed well-nourished male in no acute distress. HEENT exam atraumatic, normocephalic, neck supple without jugular venous distention. Chest clear to auscultation cardiac exam S1-S2 are regular. Abdominal exam overweight with bowel sounds, soft and nontender. Extremities no edema. Neurologic exam is alert with a normal gait.        Assessment & Plan:

## 2010-11-09 NOTE — Progress Notes (Signed)
Addended by: Bonnye Fava on: 11/09/2010 08:26 AM   Modules accepted: Orders

## 2010-11-09 NOTE — Assessment & Plan Note (Signed)
Lab Results  Component Value Date   CREATININE 1.6* 07/08/2010   Check labs today

## 2010-11-09 NOTE — Assessment & Plan Note (Signed)
Rate controlled Check protime today

## 2010-12-09 ENCOUNTER — Other Ambulatory Visit (INDEPENDENT_AMBULATORY_CARE_PROVIDER_SITE_OTHER): Payer: PRIVATE HEALTH INSURANCE

## 2010-12-09 ENCOUNTER — Other Ambulatory Visit: Payer: PRIVATE HEALTH INSURANCE

## 2010-12-09 DIAGNOSIS — I4891 Unspecified atrial fibrillation: Secondary | ICD-10-CM

## 2010-12-09 NOTE — Patient Instructions (Signed)
Missed 3 doses, Same dose, 2.5 mg everyday, check in 4 weeks By Dr.Jenkins

## 2010-12-10 LAB — I-STAT 8, (EC8 V) (CONVERTED LAB)
BUN: 40 — ABNORMAL HIGH
Bicarbonate: 20.5
Glucose, Bld: 110 — ABNORMAL HIGH
Hemoglobin: 16.7
Sodium: 137
pCO2, Ven: 44.7 — ABNORMAL LOW

## 2010-12-10 LAB — POCT I-STAT CREATININE: Operator id: 239701

## 2010-12-23 LAB — HEMOGLOBIN AND HEMATOCRIT, BLOOD
HCT: 26 — ABNORMAL LOW
HCT: 26.4 — ABNORMAL LOW
HCT: 27.1 — ABNORMAL LOW
Hemoglobin: 8.7 — ABNORMAL LOW
Hemoglobin: 8.8 — ABNORMAL LOW
Hemoglobin: 8.9 — ABNORMAL LOW
Hemoglobin: 9 — ABNORMAL LOW

## 2010-12-23 LAB — BASIC METABOLIC PANEL
BUN: 52 — ABNORMAL HIGH
Chloride: 114 — ABNORMAL HIGH
GFR calc non Af Amer: 31 — ABNORMAL LOW
Potassium: 4.5
Sodium: 143

## 2010-12-23 LAB — TYPE AND SCREEN: ABO/RH(D): O POS

## 2010-12-23 LAB — COMPREHENSIVE METABOLIC PANEL
ALT: 20
AST: 30
Albumin: 3.6
Alkaline Phosphatase: 93
CO2: 26
Chloride: 104
GFR calc Af Amer: 29 — ABNORMAL LOW
GFR calc non Af Amer: 24 — ABNORMAL LOW
Potassium: 4.5
Sodium: 141
Total Bilirubin: 0.5

## 2010-12-23 LAB — PREPARE FRESH FROZEN PLASMA

## 2010-12-23 LAB — CBC
HCT: 26.1 — ABNORMAL LOW
Hemoglobin: 8.7 — ABNORMAL LOW
MCV: 95.9
MCV: 95.9
Platelets: 260
RBC: 3.95 — ABNORMAL LOW
WBC: 12.4 — ABNORMAL HIGH
WBC: 19.5 — ABNORMAL HIGH

## 2010-12-23 LAB — PREPARE RBC (CROSSMATCH)

## 2010-12-23 LAB — DIFFERENTIAL
Basophils Absolute: 0
Eosinophils Absolute: 0
Eosinophils Relative: 0
Monocytes Absolute: 1.1 — ABNORMAL HIGH

## 2010-12-23 LAB — LIPASE, BLOOD: Lipase: 40

## 2010-12-23 LAB — OCCULT BLOOD X 1 CARD TO LAB, STOOL: Fecal Occult Bld: POSITIVE

## 2011-01-06 ENCOUNTER — Ambulatory Visit (INDEPENDENT_AMBULATORY_CARE_PROVIDER_SITE_OTHER): Payer: PRIVATE HEALTH INSURANCE

## 2011-01-06 DIAGNOSIS — I4891 Unspecified atrial fibrillation: Secondary | ICD-10-CM

## 2011-01-06 NOTE — Patient Instructions (Signed)
  Latest dosing instructions   Total Sun Mon Tue Wed Thu Fri Sat   17.5 2.5 mg 2.5 mg 2.5 mg 2.5 mg 2.5 mg 2.5 mg 2.5 mg    (2.5 mg1) (2.5 mg1) (2.5 mg1) (2.5 mg1) (2.5 mg1) (2.5 mg1) (2.5 mg1)        

## 2011-02-10 ENCOUNTER — Ambulatory Visit (INDEPENDENT_AMBULATORY_CARE_PROVIDER_SITE_OTHER): Payer: PRIVATE HEALTH INSURANCE | Admitting: Internal Medicine

## 2011-02-10 DIAGNOSIS — I4891 Unspecified atrial fibrillation: Secondary | ICD-10-CM

## 2011-02-10 NOTE — Patient Instructions (Signed)
  Latest dosing instructions   Total Sun Mon Tue Wed Thu Fri Sat   17.5 2.5 mg 2.5 mg 2.5 mg 2.5 mg 2.5 mg 2.5 mg 2.5 mg    (2.5 mg1) (2.5 mg1) (2.5 mg1) (2.5 mg1) (2.5 mg1) (2.5 mg1) (2.5 mg1)        

## 2011-02-28 ENCOUNTER — Other Ambulatory Visit: Payer: Self-pay | Admitting: Internal Medicine

## 2011-03-11 ENCOUNTER — Ambulatory Visit: Payer: PRIVATE HEALTH INSURANCE

## 2011-03-11 ENCOUNTER — Ambulatory Visit (INDEPENDENT_AMBULATORY_CARE_PROVIDER_SITE_OTHER): Payer: PRIVATE HEALTH INSURANCE | Admitting: Internal Medicine

## 2011-03-11 DIAGNOSIS — I4891 Unspecified atrial fibrillation: Secondary | ICD-10-CM

## 2011-03-11 NOTE — Patient Instructions (Signed)
  Latest dosing instructions   Total Sun Mon Tue Wed Thu Fri Sat   17.5 2.5 mg 2.5 mg 2.5 mg 2.5 mg 2.5 mg 2.5 mg 2.5 mg    (2.5 mg1) (2.5 mg1) (2.5 mg1) (2.5 mg1) (2.5 mg1) (2.5 mg1) (2.5 mg1)        

## 2011-03-12 ENCOUNTER — Ambulatory Visit: Payer: PRIVATE HEALTH INSURANCE

## 2011-04-12 ENCOUNTER — Ambulatory Visit: Payer: PRIVATE HEALTH INSURANCE

## 2011-04-13 ENCOUNTER — Ambulatory Visit (INDEPENDENT_AMBULATORY_CARE_PROVIDER_SITE_OTHER): Payer: PRIVATE HEALTH INSURANCE | Admitting: *Deleted

## 2011-04-13 DIAGNOSIS — I4891 Unspecified atrial fibrillation: Secondary | ICD-10-CM

## 2011-04-13 DIAGNOSIS — Z5181 Encounter for therapeutic drug level monitoring: Secondary | ICD-10-CM

## 2011-04-13 DIAGNOSIS — Z7901 Long term (current) use of anticoagulants: Secondary | ICD-10-CM

## 2011-04-13 NOTE — Patient Instructions (Signed)
  Latest dosing instructions   Total Sun Mon Tue Wed Thu Fri Sat   17.5 2.5 mg 2.5 mg 2.5 mg 2.5 mg 2.5 mg 2.5 mg 2.5 mg    (2.5 mg1) (2.5 mg1) (2.5 mg1) (2.5 mg1) (2.5 mg1) (2.5 mg1) (2.5 mg1)        

## 2011-05-12 ENCOUNTER — Encounter: Payer: Self-pay | Admitting: Internal Medicine

## 2011-05-12 ENCOUNTER — Ambulatory Visit (INDEPENDENT_AMBULATORY_CARE_PROVIDER_SITE_OTHER): Payer: PRIVATE HEALTH INSURANCE | Admitting: Internal Medicine

## 2011-05-12 VITALS — BP 122/82 | HR 89 | Temp 97.6°F | Wt 155.0 lb

## 2011-05-12 DIAGNOSIS — I4891 Unspecified atrial fibrillation: Secondary | ICD-10-CM

## 2011-05-12 DIAGNOSIS — E785 Hyperlipidemia, unspecified: Secondary | ICD-10-CM

## 2011-05-12 DIAGNOSIS — I10 Essential (primary) hypertension: Secondary | ICD-10-CM

## 2011-05-12 DIAGNOSIS — Z23 Encounter for immunization: Secondary | ICD-10-CM

## 2011-05-12 LAB — HEPATIC FUNCTION PANEL
AST: 32 U/L (ref 0–37)
Alkaline Phosphatase: 115 U/L (ref 39–117)
Bilirubin, Direct: 0 mg/dL (ref 0.0–0.3)
Total Protein: 8.1 g/dL (ref 6.0–8.3)

## 2011-05-12 LAB — CBC WITH DIFFERENTIAL/PLATELET
Basophils Relative: 0.5 % (ref 0.0–3.0)
Eosinophils Absolute: 0.3 10*3/uL (ref 0.0–0.7)
HCT: 43.9 % (ref 39.0–52.0)
Lymphs Abs: 3.5 10*3/uL (ref 0.7–4.0)
MCHC: 33.6 g/dL (ref 30.0–36.0)
MCV: 95.9 fl (ref 78.0–100.0)
Monocytes Absolute: 1.2 10*3/uL — ABNORMAL HIGH (ref 0.1–1.0)
Neutrophils Relative %: 61.1 % (ref 43.0–77.0)
RBC: 4.58 Mil/uL (ref 4.22–5.81)

## 2011-05-12 LAB — BASIC METABOLIC PANEL
CO2: 23 mEq/L (ref 19–32)
Calcium: 9.2 mg/dL (ref 8.4–10.5)
Chloride: 104 mEq/L (ref 96–112)
Glucose, Bld: 95 mg/dL (ref 70–99)
Potassium: 3.8 mEq/L (ref 3.5–5.1)
Sodium: 138 mEq/L (ref 135–145)

## 2011-05-12 LAB — LIPID PANEL
LDL Cholesterol: 102 mg/dL — ABNORMAL HIGH (ref 0–99)
Total CHOL/HDL Ratio: 4
Triglycerides: 122 mg/dL (ref 0.0–149.0)

## 2011-05-12 LAB — POCT INR: INR: 2.5

## 2011-05-12 NOTE — Progress Notes (Signed)
Patient ID: Daniel [name], male   DOB: [birth year], 78 y.o.   MRN: [id number] afib--no palpitations, no bleeding complications on warfarin  htn--tolerating meds   Lipids---needs f/u labs  Past Medical History  Diagnosis Date  . Gout   . Hyperlipidemia   . Hypertension   . Stroke   . Colon polyps   . Renal insufficiency   . Anemia     History   Social History  . Marital Status: Married    Spouse Name: N/A    Number of Children: N/A  . Years of Education: N/A   Occupational History  . Not on file.   Social History Main Topics  . Smoking status: Former Games developer  . Smokeless tobacco: Not on file  . Alcohol Use:   . Drug Use:   . Sexually Active:    Other Topics Concern  . Not on file   Social History Narrative  . No narrative on file    Past Surgical History  Procedure Date  . Cataract extraction     right eye    No family history on file.  No Known Allergies  Current Outpatient Prescriptions on File Prior to Visit  Medication Sig Dispense Refill  . allopurinol (ZYLOPRIM) 300 MG tablet Take 1 tablet (300 mg total) by mouth daily.  90 tablet  3  . ALPHAGAN P 0.1 % SOLN 1 drop 2 (two) times daily.       Marland Kitchen amLODipine (NORVASC) 5 MG tablet Take 1 tablet (5 mg total) by mouth daily.  90 tablet  3  . simvastatin (ZOCOR) 40 MG tablet TAKE ONE TABLET AT BEDTIME  90 tablet  2  . warfarin (COUMADIN) 2.5 MG tablet TAKE ONE ON MONDAY-WEDNESDAY -FRIDAY- OR AS DIRECTED  90 tablet  2     patient denies chest pain, shortness of breath, orthopnea. Denies lower extremity edema, abdominal pain, change in appetite, change in bowel movements. Patient denies rashes, musculoskeletal complaints. No other specific complaints in a complete review of systems.   BP 122/82  Pulse 89  Temp(Src) 97.6 F (36.4 C) (Oral)  Wt 155 lb (70.308 kg)  SpO2 97%  well-developed well-nourished male in no acute distress. HEENT exam atraumatic, normocephalic, neck supple without jugular venous  distention. Chest clear to auscultation cardiac exam S1-S2 are regular. Abdominal exam overweight with bowel sounds, soft and nontender. Extremities no edema. Neurologic exam is alert with a normal gait.

## 2011-05-12 NOTE — Patient Instructions (Signed)
  Latest dosing instructions   Total Sun Mon Tue Wed Thu Fri Sat   17.5 2.5 mg 2.5 mg 2.5 mg 2.5 mg 2.5 mg 2.5 mg 2.5 mg    (2.5 mg1) (2.5 mg1) (2.5 mg1) (2.5 mg1) (2.5 mg1) (2.5 mg1) (2.5 mg1)        

## 2011-05-12 NOTE — Assessment & Plan Note (Signed)
Well conttrolled. Continue current meds

## 2011-05-12 NOTE — Assessment & Plan Note (Signed)
Check labs today.

## 2011-05-12 NOTE — Assessment & Plan Note (Signed)
No bleeding complications Check labs today

## 2011-05-13 NOTE — Progress Notes (Signed)
Quick Note:  Left a message for pt to return call. ______ 

## 2011-05-14 ENCOUNTER — Telehealth: Payer: Self-pay | Admitting: *Deleted

## 2011-05-14 NOTE — Telephone Encounter (Signed)
Message copied by Willy Eddy on Fri May 14, 2011 12:42 PM ------      Message from: Lindley Magnus      Created: Thu May 13, 2011  9:02 AM       Call patient labs are ok

## 2011-06-09 ENCOUNTER — Ambulatory Visit (INDEPENDENT_AMBULATORY_CARE_PROVIDER_SITE_OTHER): Payer: PRIVATE HEALTH INSURANCE | Admitting: *Deleted

## 2011-06-09 DIAGNOSIS — Z5181 Encounter for therapeutic drug level monitoring: Secondary | ICD-10-CM

## 2011-06-09 DIAGNOSIS — Z7901 Long term (current) use of anticoagulants: Secondary | ICD-10-CM

## 2011-06-09 DIAGNOSIS — I4891 Unspecified atrial fibrillation: Secondary | ICD-10-CM

## 2011-06-09 NOTE — Patient Instructions (Signed)
  Latest dosing instructions   Total Sun Mon Tue Wed Thu Fri Sat   17.5 2.5 mg 2.5 mg 2.5 mg 2.5 mg 2.5 mg 2.5 mg 2.5 mg    (2.5 mg1) (2.5 mg1) (2.5 mg1) (2.5 mg1) (2.5 mg1) (2.5 mg1) (2.5 mg1)        

## 2011-07-12 ENCOUNTER — Ambulatory Visit (INDEPENDENT_AMBULATORY_CARE_PROVIDER_SITE_OTHER): Payer: PRIVATE HEALTH INSURANCE

## 2011-07-12 DIAGNOSIS — I4891 Unspecified atrial fibrillation: Secondary | ICD-10-CM

## 2011-07-12 DIAGNOSIS — Z7901 Long term (current) use of anticoagulants: Secondary | ICD-10-CM

## 2011-07-12 LAB — POCT INR: INR: 2.1

## 2011-08-01 ENCOUNTER — Other Ambulatory Visit: Payer: Self-pay | Admitting: Internal Medicine

## 2011-08-11 ENCOUNTER — Ambulatory Visit (INDEPENDENT_AMBULATORY_CARE_PROVIDER_SITE_OTHER): Payer: PRIVATE HEALTH INSURANCE | Admitting: Family

## 2011-08-11 ENCOUNTER — Ambulatory Visit: Payer: PRIVATE HEALTH INSURANCE

## 2011-08-11 DIAGNOSIS — I4891 Unspecified atrial fibrillation: Secondary | ICD-10-CM

## 2011-08-11 NOTE — Patient Instructions (Signed)
  Latest dosing instructions   Total Sun Mon Tue Wed Thu Fri Sat   17.5 2.5 mg 2.5 mg 2.5 mg 2.5 mg 2.5 mg 2.5 mg 2.5 mg    (2.5 mg1) (2.5 mg1) (2.5 mg1) (2.5 mg1) (2.5 mg1) (2.5 mg1) (2.5 mg1)        

## 2011-09-04 ENCOUNTER — Encounter (HOSPITAL_COMMUNITY): Payer: Self-pay | Admitting: Emergency Medicine

## 2011-09-04 ENCOUNTER — Emergency Department (HOSPITAL_COMMUNITY)
Admission: EM | Admit: 2011-09-04 | Discharge: 2011-09-05 | Disposition: A | Discharge status: Home / Self Care | Payer: Medicare Other | Attending: Emergency Medicine | Admitting: Emergency Medicine

## 2011-09-04 ENCOUNTER — Emergency Department (HOSPITAL_COMMUNITY): Payer: Medicare Other

## 2011-09-04 DIAGNOSIS — R079 Chest pain, unspecified: Secondary | ICD-10-CM | POA: Insufficient documentation

## 2011-09-04 DIAGNOSIS — I10 Essential (primary) hypertension: Secondary | ICD-10-CM | POA: Insufficient documentation

## 2011-09-04 DIAGNOSIS — Z8673 Personal history of transient ischemic attack (TIA), and cerebral infarction without residual deficits: Secondary | ICD-10-CM | POA: Insufficient documentation

## 2011-09-04 DIAGNOSIS — M25519 Pain in unspecified shoulder: Secondary | ICD-10-CM | POA: Insufficient documentation

## 2011-09-04 DIAGNOSIS — M109 Gout, unspecified: Secondary | ICD-10-CM | POA: Insufficient documentation

## 2011-09-04 DIAGNOSIS — E785 Hyperlipidemia, unspecified: Secondary | ICD-10-CM | POA: Insufficient documentation

## 2011-09-04 DIAGNOSIS — S2239XA Fracture of one rib, unspecified side, initial encounter for closed fracture: Secondary | ICD-10-CM | POA: Insufficient documentation

## 2011-09-04 DIAGNOSIS — S6990XA Unspecified injury of unspecified wrist, hand and finger(s), initial encounter: Secondary | ICD-10-CM | POA: Insufficient documentation

## 2011-09-04 DIAGNOSIS — T148XXA Other injury of unspecified body region, initial encounter: Secondary | ICD-10-CM

## 2011-09-04 DIAGNOSIS — S59909A Unspecified injury of unspecified elbow, initial encounter: Secondary | ICD-10-CM | POA: Insufficient documentation

## 2011-09-04 DIAGNOSIS — IMO0002 Reserved for concepts with insufficient information to code with codable children: Secondary | ICD-10-CM | POA: Insufficient documentation

## 2011-09-04 DIAGNOSIS — Z23 Encounter for immunization: Secondary | ICD-10-CM | POA: Insufficient documentation

## 2011-09-04 DIAGNOSIS — S6992XA Unspecified injury of left wrist, hand and finger(s), initial encounter: Secondary | ICD-10-CM

## 2011-09-04 DIAGNOSIS — W010XXA Fall on same level from slipping, tripping and stumbling without subsequent striking against object, initial encounter: Secondary | ICD-10-CM | POA: Insufficient documentation

## 2011-09-04 DIAGNOSIS — Z7901 Long term (current) use of anticoagulants: Secondary | ICD-10-CM | POA: Insufficient documentation

## 2011-09-04 DIAGNOSIS — S2232XA Fracture of one rib, left side, initial encounter for closed fracture: Secondary | ICD-10-CM

## 2011-09-04 DIAGNOSIS — S0990XA Unspecified injury of head, initial encounter: Secondary | ICD-10-CM | POA: Insufficient documentation

## 2011-09-04 NOTE — ED Notes (Signed)
Pt alert, nad, arrives from home, c/o right rib pain, onset today s/p slip fall injury, pt resp even unlabored, skin pwd

## 2011-09-05 ENCOUNTER — Emergency Department (HOSPITAL_COMMUNITY): Payer: Medicare Other

## 2011-09-05 MED ORDER — HYDROCODONE-ACETAMINOPHEN 5-325 MG PO TABS
1.0000 | ORAL_TABLET | Freq: Once | ORAL | Status: AC
  Administered 2011-09-05: 1 via ORAL
  Filled 2011-09-05: qty 1

## 2011-09-05 MED ORDER — TETANUS-DIPHTH-ACELL PERTUSSIS 5-2.5-18.5 LF-MCG/0.5 IM SUSP
0.5000 mL | Freq: Once | INTRAMUSCULAR | Status: AC
  Administered 2011-09-05: 0.5 mL via INTRAMUSCULAR
  Filled 2011-09-05: qty 0.5

## 2011-09-05 MED ORDER — HYDROCODONE-ACETAMINOPHEN 5-325 MG PO TABS: 1.0000 | ORAL_TABLET | Freq: Four times a day (QID) | ORAL | Status: AC | PRN

## 2011-09-05 MED ORDER — BACITRACIN ZINC 500 UNIT/GM EX OINT
TOPICAL_OINTMENT | CUTANEOUS | Status: AC
  Administered 2011-09-05: 03:00:00
  Filled 2011-09-05: qty 0.9

## 2011-09-05 NOTE — ED Notes (Signed)
Pt reports he was walking down hill at approximately 500 today, tripped over a log, sustained injures to left side of body. Pt reports pain is most severe on left rib cage. Small laceration to left elbow and left pinky finger. States there are "few scrapes" on legs, bilat. Family members report pt hit his head, denies any loss of consciousness. Daughter states pt is "having trouble breathing", pt denies. Pt reports he had stroke 10 years ago and cannot have any medications other than tylenol. Denies taking any otc tylenol since injury. Reports pain 10/10.

## 2011-09-05 NOTE — Discharge Instructions (Signed)
Rib Fracture  Your caregiver has diagnosed you as having a rib fracture (a break). This can occur by a blow to the chest, by a fall against a hard object, or by violent coughing or sneezing. There may be one or many breaks. Rib fractures may heal on their own within 3 to 8 weeks. The longer healing period is usually associated with a continued cough or other aggravating activities.  HOME CARE INSTRUCTIONS  Avoid strenuous activity. Be careful during activities and avoid bumping the injured rib. Activities that cause pain pull on the fracture site(s) and are best avoided if possible.  Eat a normal, well-balanced diet. Drink plenty of fluids to avoid constipation.  Take deep breaths several times a day to keep lungs free of infection. Try to cough several times a day, splinting the injured area with a pillow. This will help prevent pneumonia.  Do not wear a rib belt or binder. These restrict breathing which can lead to pneumonia.  Only take over-the-counter or prescription medicines for pain, discomfort, or fever as directed by your caregiver.  SEEK MEDICAL CARE IF:  You develop a continual cough, associated with thick or bloody sputum.  SEEK IMMEDIATE MEDICAL CARE IF:  You have a fever.  You have difficulty breathing.  You have nausea (feeling sick to your stomach), vomiting, or abdominal (belly) pain.  You have worsening pain, not controlled with medications.

## 2011-09-05 NOTE — ED Provider Notes (Signed)
History     CSN: [id number]  Arrival date & time 09/04/11  2333   First MD Initiated Contact with Patient 09/05/11 0106      Chief Complaint  Patient presents with  . Rib Injury    (Consider location/radiation/quality/duration/timing/severity/associated sxs/prior treatment) HPI History provided by patient and family bedside. Around 5 PM tonight was walking and tripped injuring left anterior ribs. Also injured his left wrist and now is developing some right shoulder pain. He did hit his head and sustained a small abrasion over his left eye. He denies any head pain or neck pain. He has not taken any medications for this. He does take Coumadin. Last INR reported around 2.0 and in normal range. He denies any other pain injury or trauma. Rib pain is sharp and severe and not radiating. Pain comes and goes and is worse with movement but not worse with breathing. Pain has been getting worse throughout the day and now presents about 8 hours after falling for evaluation. Past Medical History  Diagnosis Date  . Gout   . Hyperlipidemia   . Hypertension   . Stroke   . Colon polyps   . Renal insufficiency   . Anemia     Past Surgical History  Procedure Date  . Cataract extraction     right eye    No family history on file.  History  Substance Use Topics  . Smoking status: Former Games developer  . Smokeless tobacco: Not on file  . Alcohol Use: No      Review of Systems  Constitutional: Negative for fever and chills.  HENT: Negative for neck pain and neck stiffness.   Eyes: Negative for pain.  Respiratory: Negative for shortness of breath.   Cardiovascular: Positive for chest pain.  Gastrointestinal: Negative for abdominal pain.  Genitourinary: Negative for dysuria.  Musculoskeletal: Negative for back pain.  Skin: Positive for wound. Negative for rash.  Neurological: Negative for headaches.  All other systems reviewed and are negative.    Allergies  Review of patient's allergies  indicates no known allergies.  Home Medications   Current Outpatient Rx  Name Route Sig Dispense Refill  . ALLOPURINOL 300 MG PO TABS Oral Take 1 tablet (300 mg total) by mouth daily. 90 tablet 3  . AMLODIPINE BESYLATE 5 MG PO TABS Oral Take 1 tablet (5 mg total) by mouth daily. 90 tablet 3  . SIMVASTATIN 40 MG PO TABS  TAKE ONE TABLET AT BEDTIME 90 tablet 1    No refills available  . WARFARIN SODIUM 2.5 MG PO TABS Oral Take 2.5 mg by mouth daily.    . ALPHAGAN P 0.1 % OP SOLN  1 drop 2 (two) times daily.       BP 139/77  Pulse 95  Temp 98.6 F (37 C) (Oral)  Resp 18  Ht 5\' 6"  (1.676 m)  Wt 155 lb (70.308 kg)  BMI 25.02 kg/m2  SpO2 93%  Physical Exam  Constitutional: He is oriented to person, place, and time. He appears well-developed and well-nourished.  HENT:  Head: Normocephalic.       Very small abrasion to left for head without swelling and without ecchymosis. No bony tenderness. No entrapment with extraocular movements intact. No other face or head trauma.  Eyes: Conjunctivae and EOM are normal. Pupils are equal, round, and reactive to light.  Neck: Trachea normal and full passive range of motion without pain. Neck supple. No thyromegaly present.       Midline tenderness  or deformity  Cardiovascular: Normal rate, regular rhythm, S1 normal, S2 normal, intact distal pulses and normal pulses.     No systolic murmur is present   No diastolic murmur is present  Pulses:      Radial pulses are 2+ on the right side, and 2+ on the left side.  Pulmonary/Chest: Effort normal and breath sounds normal. He has no wheezes. He has no rhonchi. He has no rales.       Reproducible tenderness left anterior chest wall. No crepitus.  Abdominal: Soft. Normal appearance and bowel sounds are normal. There is no tenderness. There is no CVA tenderness and negative Murphy's sign.  Musculoskeletal: Normal range of motion.       Left hand with abrasion over the fifth metacarpal. No laceration. No  active bleeding. No bony tenderness or swelling in that area. Very mild tenderness to dorsal lateral wrist in the area of the anatomical snuffbox. No swelling or ecchymosis in this area. Distal neurovascular intact throughout all extremities. Mild abrasion to left knee without effusion or bony tenderness.  Neurological: He is alert and oriented to person, place, and time. He has normal strength and normal reflexes. No cranial nerve deficit or sensory deficit. He displays a negative Romberg sign. GCS eye subscore is 4. GCS verbal subscore is 5. GCS motor subscore is 6.       Normal Gait  Skin: Skin is warm and dry. No rash noted. He is not diaphoretic. No cyanosis. Nails show no clubbing.  Psychiatric: He has a normal mood and affect. His speech is normal and behavior is normal.       Cooperative and appropriate    ED Course  Procedures (including critical care time)  Labs Reviewed - No data to display Dg Ribs Unilateral W/chest Left  09/05/2011  *RADIOLOGY REPORT*  Clinical Data: Left anterior upper chest wall pain secondary to a fall.  LEFT RIBS AND CHEST - 3+ VIEW  Comparison: Chest x-ray dated 11/22/2007  Findings: There is irregularity of the anterior lateral aspect of the left fourth rib which probably represents a hairline fracture.  Heart size and vascularity are normal.  No pneumothorax or pleural effusion.  Moderate degenerative arthritis of the left shoulder.  IMPRESSION: Probable hairline fracture of the anterior aspect of the left fourth rib.  Original Report Authenticated By: Gwynn Burly, M.D.   Dg Wrist Complete Left  09/05/2011  *RADIOLOGY REPORT*  Clinical Data: Status post fall; pain and swelling across the left metacarpals.  LEFT WRIST - COMPLETE 3+ VIEW  Comparison: None.  Findings: There is no evidence of fracture or dislocation.  The carpal rows are intact, and demonstrate normal alignment.  The joint spaces are preserved.  Minimal degenerative change is noted along the  dorsal aspect of the carpometacarpal joints.  No significant soft tissue abnormalities are seen.  IMPRESSION: No evidence of fracture or dislocation.  Original Report Authenticated By: Tonia Ghent, M.D.   Ct Head Wo Contrast  09/05/2011  *RADIOLOGY REPORT*  Clinical Data: Status post fall; concern for head injury.  Patient on Coumadin.  CT HEAD WITHOUT CONTRAST  Technique:  Contiguous axial images were obtained from the base of the skull through the vertex without contrast.  Comparison: None.  Findings: There is no evidence of acute infarction, mass lesion, or intra- or extra-axial hemorrhage on CT.  The posterior fossa, including the cerebellum, brainstem and fourth ventricle, is within normal limits.  The third and lateral ventricles, and basal ganglia are unremarkable  in appearance.  The cerebral hemispheres are symmetric in appearance, with normal gray- white differentiation.  No mass effect or midline shift is seen.  There is no evidence of fracture; visualized osseous structures are unremarkable in appearance.  The orbits are within normal limits. The paranasal sinuses and mastoid air cells are well-aerated.  No significant soft tissue abnormalities are seen.  A 3 mm focus of metal is noted embedded overlying the right frontal calvarium.  IMPRESSION:  1.  No evidence of traumatic intracranial injury or fracture. 2.  3 mm focus of metal incidentally noted embedded overlying the right frontal calvarium.  Original Report Authenticated By: Tonia Ghent, M.D.    Hydrocodone provided. Consents from her provided  Definitive fracture care provided for left rib fracture. Pneumonia precautions verbalizes as understood. Incentive spur on her provided. Pain medications provided.  Recheck at 3:15 AM is feeling much better with medications. Has primary care followup and states understanding all discharge and followup instructions.   MDM    Fall with rib fracture and possible scaphoid fracture. Left wrist  splint provided and plan followup in the clinic. Prescription for Vicodin provided. Normal neuro exam with CT scan 8 hours post fall on Coumadin - the patient stable for discharge home. Family and patient both state understanding all discharge and followup instructions.     Sunnie Nielsen, MD 09/05/11 [phone]

## 2011-09-08 ENCOUNTER — Ambulatory Visit (INDEPENDENT_AMBULATORY_CARE_PROVIDER_SITE_OTHER): Payer: Medicare Other | Admitting: Internal Medicine

## 2011-09-08 ENCOUNTER — Encounter: Payer: PRIVATE HEALTH INSURANCE | Admitting: Family

## 2011-09-08 ENCOUNTER — Ambulatory Visit (INDEPENDENT_AMBULATORY_CARE_PROVIDER_SITE_OTHER): Payer: Medicare Other | Admitting: Family

## 2011-09-08 ENCOUNTER — Encounter: Payer: Self-pay | Admitting: Internal Medicine

## 2011-09-08 VITALS — BP 130/72 | HR 84 | Temp 98.1°F | Wt 158.0 lb

## 2011-09-08 DIAGNOSIS — G8911 Acute pain due to trauma: Secondary | ICD-10-CM

## 2011-09-08 DIAGNOSIS — I4891 Unspecified atrial fibrillation: Secondary | ICD-10-CM

## 2011-09-08 DIAGNOSIS — M545 Low back pain: Secondary | ICD-10-CM

## 2011-09-08 NOTE — Progress Notes (Signed)
Patient ID: Daniel [name], male   DOB: 29-May-1932, 78 y.o.   MRN: [id number] Larey Seat 3 days ago-- reviewed ED record and imaging studies.  Still with left sided rib pain Has developed left sided back pain for 2 days-- no additional trauma.  Past Medical History  Diagnosis Date  . Gout   . Hyperlipidemia   . Hypertension   . Stroke   . Colon polyps   . Renal insufficiency   . Anemia     History   Social History  . Marital Status: Married    Spouse Name: N/A    Number of Children: N/A  . Years of Education: N/A   Occupational History  . Not on file.   Social History Main Topics  . Smoking status: Former Games developer  . Smokeless tobacco: Not on file  . Alcohol Use: No  . Drug Use:   . Sexually Active:    Other Topics Concern  . Not on file   Social History Narrative  . No narrative on file    Past Surgical History  Procedure Date  . Cataract extraction     right eye    No family history on file.  No Known Allergies  Current Outpatient Prescriptions on File Prior to Visit  Medication Sig Dispense Refill  . allopurinol (ZYLOPRIM) 300 MG tablet Take 1 tablet (300 mg total) by mouth daily.  90 tablet  3  . ALPHAGAN P 0.1 % SOLN 1 drop 2 (two) times daily.       Marland Kitchen amLODipine (NORVASC) 5 MG tablet Take 1 tablet (5 mg total) by mouth daily.  90 tablet  3  . HYDROcodone-acetaminophen (NORCO) 5-325 MG per tablet Take 1 tablet by mouth every 6 (six) hours as needed for pain.  20 tablet  0  . simvastatin (ZOCOR) 40 MG tablet TAKE ONE TABLET AT BEDTIME  90 tablet  1  . warfarin (COUMADIN) 2.5 MG tablet Take 2.5 mg by mouth daily.         patient denies chest pain, shortness of breath, orthopnea. Denies lower extremity edema, abdominal pain, change in appetite, change in bowel movements. Patient denies rashes, musculoskeletal complaints. No other specific complaints in a complete review of systems.   BP 130/72  Pulse 84  Temp 98.1 F (36.7 C) (Oral)  Wt 158 lb (71.668 kg)  well-developed well-nourished male in no acute distress. HEENT exam atraumatic, normocephalic, neck supple without jugular venous distention. Chest clear to auscultation cardiac exam S1-S2 are regular. Abdominal exam overweight with bowel sounds, soft and nontender. Extremities no edema. Neurologic exam is alert with a normal gait.  A/P-= trauma- Has resultant rib and back pain without any concerning findings. No further evaluation at this time.

## 2011-09-08 NOTE — Patient Instructions (Addendum)
Hold today's dose, continue 2.5 mg everyday, check in 2 weeks    Latest dosing instructions   Total Sun Mon Tue Wed Thu Fri Sat   17.5 2.5 mg 2.5 mg 2.5 mg 2.5 mg 2.5 mg 2.5 mg 2.5 mg    (2.5 mg1) (2.5 mg1) (2.5 mg1) (2.5 mg1) (2.5 mg1) (2.5 mg1) (2.5 mg1)

## 2011-09-09 ENCOUNTER — Encounter: Payer: PRIVATE HEALTH INSURANCE | Admitting: Family

## 2011-09-09 ENCOUNTER — Ambulatory Visit: Payer: PRIVATE HEALTH INSURANCE | Admitting: Internal Medicine

## 2011-09-11 ENCOUNTER — Other Ambulatory Visit: Payer: Self-pay | Admitting: Internal Medicine

## 2011-09-21 ENCOUNTER — Ambulatory Visit (INDEPENDENT_AMBULATORY_CARE_PROVIDER_SITE_OTHER): Payer: Medicare Other | Admitting: Family

## 2011-09-21 DIAGNOSIS — I4891 Unspecified atrial fibrillation: Secondary | ICD-10-CM

## 2011-09-21 NOTE — Patient Instructions (Addendum)
  Latest dosing instructions   Total Sun Mon Tue Wed Thu Fri Sat   17.5 2.5 mg 2.5 mg 2.5 mg 2.5 mg 2.5 mg 2.5 mg 2.5 mg    (2.5 mg1) (2.5 mg1) (2.5 mg1) (2.5 mg1) (2.5 mg1) (2.5 mg1) (2.5 mg1)       Take 1/2 pill today. Continue 2.5 mg everyday, recheck in 3 weeks.

## 2011-10-13 ENCOUNTER — Ambulatory Visit (INDEPENDENT_AMBULATORY_CARE_PROVIDER_SITE_OTHER): Payer: Medicare Other | Admitting: Family

## 2011-10-13 DIAGNOSIS — I4891 Unspecified atrial fibrillation: Secondary | ICD-10-CM

## 2011-10-13 NOTE — Patient Instructions (Signed)
Hold Coumadin. Then continue 2.5 mg every day except 1/2 tab on [facility] only.     Latest dosing instructions   Total Sun Mon Tue Wed Thu Fri Sat   16.25 2.5 mg 2.5 mg 2.5 mg 1.25 mg 2.5 mg 2.5 mg 2.5 mg    (2.5 mg1) (2.5 mg1) (2.5 mg1) (2.5 mg0.5) (2.5 mg1) (2.5 mg1) (2.5 mg1)

## 2011-11-03 ENCOUNTER — Other Ambulatory Visit: Payer: PRIVATE HEALTH INSURANCE

## 2011-11-10 ENCOUNTER — Other Ambulatory Visit (INDEPENDENT_AMBULATORY_CARE_PROVIDER_SITE_OTHER): Payer: Medicare Other

## 2011-11-10 ENCOUNTER — Ambulatory Visit: Payer: PRIVATE HEALTH INSURANCE | Admitting: Internal Medicine

## 2011-11-10 DIAGNOSIS — I4891 Unspecified atrial fibrillation: Secondary | ICD-10-CM

## 2011-11-10 LAB — POCT INR: INR: 2.3

## 2011-11-10 NOTE — Patient Instructions (Signed)
  Latest dosing instructions   Total Sun Mon Tue Wed Thu Fri Sat   16.25 2.5 mg 2.5 mg 2.5 mg 1.25 mg 2.5 mg 2.5 mg 2.5 mg    (2.5 mg1) (2.5 mg1) (2.5 mg1) (2.5 mg0.5) (2.5 mg1) (2.5 mg1) (2.5 mg1)

## 2011-11-30 ENCOUNTER — Ambulatory Visit: Payer: PRIVATE HEALTH INSURANCE | Admitting: Internal Medicine

## 2011-12-22 ENCOUNTER — Ambulatory Visit (INDEPENDENT_AMBULATORY_CARE_PROVIDER_SITE_OTHER): Payer: Medicare Other | Admitting: Family

## 2011-12-22 DIAGNOSIS — I4891 Unspecified atrial fibrillation: Secondary | ICD-10-CM

## 2011-12-22 LAB — POCT INR: INR: 2.6

## 2011-12-22 NOTE — Patient Instructions (Addendum)
Same dose 2.5 mg every day except 1/2 tab on [facility] only.     Latest dosing instructions   Total Sun Mon Tue Wed Thu Fri Sat   16.25 2.5 mg 2.5 mg 2.5 mg 1.25 mg 2.5 mg 2.5 mg 2.5 mg    (2.5 mg1) (2.5 mg1) (2.5 mg1) (2.5 mg0.5) (2.5 mg1) (2.5 mg1) (2.5 mg1)

## 2012-01-10 ENCOUNTER — Ambulatory Visit: Payer: PRIVATE HEALTH INSURANCE | Admitting: Internal Medicine

## 2012-01-18 ENCOUNTER — Encounter: Payer: Self-pay | Admitting: Internal Medicine

## 2012-01-18 ENCOUNTER — Ambulatory Visit (INDEPENDENT_AMBULATORY_CARE_PROVIDER_SITE_OTHER): Payer: Medicare Other | Admitting: Internal Medicine

## 2012-01-18 ENCOUNTER — Ambulatory Visit (INDEPENDENT_AMBULATORY_CARE_PROVIDER_SITE_OTHER): Payer: Medicare Other | Admitting: Family

## 2012-01-18 VITALS — BP 132/80 | HR 96 | Temp 97.5°F | Wt 158.0 lb

## 2012-01-18 DIAGNOSIS — I10 Essential (primary) hypertension: Secondary | ICD-10-CM

## 2012-01-18 DIAGNOSIS — I4891 Unspecified atrial fibrillation: Secondary | ICD-10-CM

## 2012-01-18 DIAGNOSIS — E785 Hyperlipidemia, unspecified: Secondary | ICD-10-CM

## 2012-01-18 LAB — HEPATIC FUNCTION PANEL
ALT: 23 U/L (ref 0–53)
AST: 29 U/L (ref 0–37)
Albumin: 3.7 g/dL (ref 3.5–5.2)
Total Protein: 8.2 g/dL (ref 6.0–8.3)

## 2012-01-18 LAB — BASIC METABOLIC PANEL
Calcium: 9.1 mg/dL (ref 8.4–10.5)
GFR: 48.57 mL/min — ABNORMAL LOW (ref >60.00)
Potassium: 4.3 mEq/L (ref 3.5–5.1)
Sodium: 139 mEq/L (ref 135–145)

## 2012-01-18 LAB — LIPID PANEL
HDL: 43.5 mg/dL (ref >39.00)
Triglycerides: 141 mg/dL (ref 0.0–149.0)
VLDL: 28.2 mg/dL (ref 0.0–40.0)

## 2012-01-18 NOTE — Progress Notes (Signed)
Patient ID: Daniel [name], male   DOB: 10/27/1932, 78 y.o.   MRN: [id number] Afib-- on warfarin- he has not felt any recent palpitations  Lipids- tolerating meds  Gout-- no recurrence  htn-- tolerating meds but not recording BPs at home  Past Medical History  Diagnosis Date  . Gout   . Hyperlipidemia   . Hypertension   . Stroke   . Colon polyps   . Renal insufficiency   . Anemia     History   Social History  . Marital Status: Married    Spouse Name: N/A    Number of Children: N/A  . Years of Education: N/A   Occupational History  . Not on file.   Social History Main Topics  . Smoking status: Former Games developer  . Smokeless tobacco: Not on file  . Alcohol Use: No  . Drug Use:   . Sexually Active:    Other Topics Concern  . Not on file   Social History Narrative  . No narrative on file    Past Surgical History  Procedure Date  . Cataract extraction     right eye    No family history on file.  No Known Allergies  Current Outpatient Prescriptions on File Prior to Visit  Medication Sig Dispense Refill  . allopurinol (ZYLOPRIM) 300 MG tablet TAKE 1 TABLET BY MOUTH ONCE A DAY  90 tablet  1  . amLODipine (NORVASC) 5 MG tablet TAKE 1 TABLET BY MOUTH ONCE A DAY  90 tablet  1  . simvastatin (ZOCOR) 40 MG tablet TAKE ONE TABLET AT BEDTIME  90 tablet  1  . warfarin (COUMADIN) 2.5 MG tablet Take 2.5 mg by mouth daily.         patient denies chest pain, shortness of breath, orthopnea. Denies lower extremity edema, abdominal pain, change in appetite, change in bowel movements. Patient denies rashes, musculoskeletal complaints. No other specific complaints in a complete review of systems.   BP 132/80  Pulse 96  Temp 97.5 F (36.4 C) (Oral)  Wt 158 lb (71.668 kg)  well-developed well-nourished male in no acute distress. HEENT exam atraumatic, normocephalic, neck supple without jugular venous distention. Chest clear to auscultation cardiac exam S1-S2 are regular.  Abdominal exam overweight with bowel sounds, soft and nontender. Extremities no edema. Neurologic exam is alert with a normal gait.

## 2012-01-18 NOTE — Assessment & Plan Note (Signed)
Lipids-check labs today

## 2012-01-18 NOTE — Patient Instructions (Signed)
Same dose 2.5 mg every day except 1/2 tab on [facility] only. Recheck in 6 weeks. Eat an extra serving of green veggies.     Latest dosing instructions   Total Sun Mon Tue Wed Thu Fri Sat   16.25 2.5 mg 2.5 mg 2.5 mg 1.25 mg 2.5 mg 2.5 mg 2.5 mg    (2.5 mg1) (2.5 mg1) (2.5 mg1) (2.5 mg0.5) (2.5 mg1) (2.5 mg1) (2.5 mg1)

## 2012-01-18 NOTE — Assessment & Plan Note (Signed)
On exam he is in SR- continue same meds

## 2012-01-18 NOTE — Assessment & Plan Note (Signed)
Adequate control Continue same meds 

## 2012-02-22 ENCOUNTER — Telehealth: Payer: Self-pay | Admitting: Internal Medicine

## 2012-02-22 NOTE — Telephone Encounter (Signed)
Patient Information:  Caller Name: Effie  Phone: [phone]  Patient: Daniel [name], Daniel [name]  Gender: Male  DOB: 07-Dec-1932  Age: 78 Years  PCP: Adline Mango [facility])   Symptoms  Reason For Call & Symptoms: gout flare, left mid foot  Reviewed Health History In EMR: Yes  Reviewed Medications In EMR: Yes  Reviewed Allergies In EMR: Yes  Reviewed Surgeries / Procedures: Yes  Date of Onset of Symptoms: 02/27/2012  Guideline(s) Used:  Foot Pain  Disposition Per Guideline:   See Within 3 Days in Office  Reason For Disposition Reached:   Moderate pain (e.g., interferes with normal activities, limping) and present > 3 days  Advice Given:  N/A  Office Follow Up:  Does the office need to follow up with this patient?: No  Instructions For The Office: N/A  Appointment Scheduled:  02/23/2012 09:30:00 Appointment Scheduled Provider:  Adline Mango (Family Practice)  RN Note:  States having gout flare; would like refill on Rx he had in the past "which starts with a 'P'."  Insists it is not allopurinol. Per protocol, emergent symptoms denied; appt scheduled 02/23/12 0930 with Ms. Orvan Falconer.

## 2012-02-23 ENCOUNTER — Ambulatory Visit (INDEPENDENT_AMBULATORY_CARE_PROVIDER_SITE_OTHER): Payer: Medicare Other | Admitting: Family

## 2012-02-23 ENCOUNTER — Encounter: Payer: Self-pay | Admitting: Family

## 2012-02-23 VITALS — BP 120/78 | HR 98 | Temp 97.9°F | Wt 158.0 lb

## 2012-02-23 DIAGNOSIS — I4891 Unspecified atrial fibrillation: Secondary | ICD-10-CM

## 2012-02-23 DIAGNOSIS — M109 Gout, unspecified: Secondary | ICD-10-CM

## 2012-02-23 MED ORDER — METHYLPREDNISOLONE ACETATE 40 MG/ML IJ SUSP
40.0000 mg | Freq: Once | INTRAMUSCULAR | Status: AC
  Administered 2012-02-23: 40 mg via INTRAMUSCULAR

## 2012-02-23 NOTE — Patient Instructions (Addendum)
Same dose 2.5 mg every day except 1/2 tab on [facility] only. Recheck in 6 weeks.     Latest dosing instructions   Total Sun Mon Tue Wed Thu Fri Sat   16.25 2.5 mg 2.5 mg 2.5 mg 1.25 mg 2.5 mg 2.5 mg 2.5 mg    (2.5 mg1) (2.5 mg1) (2.5 mg1) (2.5 mg0.5) (2.5 mg1) (2.5 mg1) (2.5 mg1)

## 2012-02-23 NOTE — Progress Notes (Signed)
Subjective:    Patient ID: Daniel [name], male    DOB: 1932-07-28, 78 y.o.   MRN: [id number]  HPI 78 year old white male, nonsmoker, patient of Dr. Cato Mulligan is in today with complaints of a gout flare to his left ankle x2 days. Reports the pain been afford seeing, worse with walking and with movement. Feels that his symptoms may be improving. He chronically takes allopurinol daily that typically controls his gout flares. He is on Coumadin for atrial fibrillation and is now well controlled.   Review of Systems  HENT: Negative.   Respiratory: Negative.   Cardiovascular: Negative.   Gastrointestinal: Negative.   Genitourinary: Negative.   Musculoskeletal: Positive for joint swelling.       Left ankle  Skin: Negative.   Neurological: Negative.   Hematological: Negative.   Psychiatric/Behavioral: Negative.    Past Medical History  Diagnosis Date  . Gout   . Hyperlipidemia   . Hypertension   . Stroke   . Colon polyps   . Renal insufficiency   . Anemia     History   Social History  . Marital Status: Married    Spouse Name: N/A    Number of Children: N/A  . Years of Education: N/A   Occupational History  . Not on file.   Social History Main Topics  . Smoking status: Former Games developer  . Smokeless tobacco: Not on file  . Alcohol Use: No  . Drug Use:   . Sexually Active:    Other Topics Concern  . Not on file   Social History Narrative  . No narrative on file    Past Surgical History  Procedure Date  . Cataract extraction     right eye    No family history on file.  No Known Allergies  Current Outpatient Prescriptions on File Prior to Visit  Medication Sig Dispense Refill  . allopurinol (ZYLOPRIM) 300 MG tablet TAKE 1 TABLET BY MOUTH ONCE A DAY  90 tablet  1  . amLODipine (NORVASC) 5 MG tablet TAKE 1 TABLET BY MOUTH ONCE A DAY  90 tablet  1  . brimonidine (ALPHAGAN) 0.2 % ophthalmic solution 1 drop 3 (three) times daily.      . simvastatin (ZOCOR) 40 MG tablet  TAKE ONE TABLET AT BEDTIME  90 tablet  1  . warfarin (COUMADIN) 2.5 MG tablet Take 2.5 mg by mouth daily.       No current facility-administered medications on file prior to visit.    BP 120/78  Pulse 98  Temp 97.9 F (36.6 C) (Oral)  Wt 158 lb (71.668 kg)  SpO2 97%chart    Objective:   Physical Exam  Constitutional: He is oriented to person, place, and time. He appears well-developed and well-nourished.  Neck: Normal range of motion. Neck supple. Thyromegaly present.  Cardiovascular: Normal rate, regular rhythm and normal heart sounds.   Pulmonary/Chest: Effort normal.  Musculoskeletal: He exhibits no edema and no tenderness.       Pain with flexion and extension of the left ankle. Minimal redness and warmth. Pedal pulses 2 out of 2.  Neurological: He is alert and oriented to person, place, and time.  Skin: Skin is warm and dry.  Psychiatric: He has a normal mood and affect.          Assessment & Plan:  Assessment: Gout flare, high-risk medication usage  Plan: Depo-Medrol 40 mg IM x1 given. Continue allopurinol. Continue current medication therapy. Will recheck patient in 6 weeks for  recheck of his PT/INR. Call the office with any questions or concerns sooner.

## 2012-02-29 ENCOUNTER — Encounter: Payer: Medicare Other | Admitting: Family

## 2012-03-28 ENCOUNTER — Other Ambulatory Visit: Payer: Self-pay | Admitting: Internal Medicine

## 2012-04-05 ENCOUNTER — Ambulatory Visit (INDEPENDENT_AMBULATORY_CARE_PROVIDER_SITE_OTHER): Payer: Medicare Other | Admitting: Family

## 2012-04-05 DIAGNOSIS — I4891 Unspecified atrial fibrillation: Secondary | ICD-10-CM

## 2012-04-05 NOTE — Patient Instructions (Addendum)
Continue same dose and recheck in 6 weeks    Latest dosing instructions   Total Sun Mon Tue Wed Thu Fri Sat   16.25 2.5 mg 2.5 mg 2.5 mg 1.25 mg 2.5 mg 2.5 mg 2.5 mg    (2.5 mg1) (2.5 mg1) (2.5 mg1) (2.5 mg0.5) (2.5 mg1) (2.5 mg1) (2.5 mg1)

## 2012-05-02 ENCOUNTER — Other Ambulatory Visit: Payer: Self-pay | Admitting: Internal Medicine

## 2012-05-17 ENCOUNTER — Ambulatory Visit (INDEPENDENT_AMBULATORY_CARE_PROVIDER_SITE_OTHER): Payer: Medicare Other | Admitting: Family

## 2012-05-17 DIAGNOSIS — I4891 Unspecified atrial fibrillation: Secondary | ICD-10-CM

## 2012-05-17 LAB — POCT INR: INR: 2.1

## 2012-05-17 NOTE — Patient Instructions (Signed)
Continue same dose and recheck in 6 weeks  Anticoagulation Dose Instructions as of 05/17/2012     Daniel [name] Tue Wed Thu Fri Sat   New Dose 2.5 mg 2.5 mg 2.5 mg 1.25 mg 2.5 mg 2.5 mg 2.5 mg    Description       Continue same dose and recheck in 6 weeks

## 2012-06-07 ENCOUNTER — Other Ambulatory Visit: Payer: Self-pay | Admitting: Internal Medicine

## 2012-06-14 ENCOUNTER — Other Ambulatory Visit: Payer: Self-pay | Admitting: Internal Medicine

## 2012-06-28 ENCOUNTER — Ambulatory Visit (INDEPENDENT_AMBULATORY_CARE_PROVIDER_SITE_OTHER): Payer: Medicare Other | Admitting: Family

## 2012-06-28 DIAGNOSIS — I4891 Unspecified atrial fibrillation: Secondary | ICD-10-CM

## 2012-06-28 LAB — POCT INR: INR: 2.9

## 2012-06-28 NOTE — Patient Instructions (Addendum)
Continue same dose and recheck in 6 weeks  Anticoagulation Dose Instructions as of 06/28/2012     Daniel [name] Tue Wed Thu Fri Sat   New Dose 2.5 mg 2.5 mg 2.5 mg 1.25 mg 2.5 mg 2.5 mg 2.5 mg    Description       Continue same dose and recheck in 6 weeks

## 2012-07-05 ENCOUNTER — Other Ambulatory Visit: Payer: Self-pay | Admitting: Family

## 2012-07-19 ENCOUNTER — Ambulatory Visit: Payer: Medicare Other | Admitting: Internal Medicine

## 2012-08-09 ENCOUNTER — Ambulatory Visit (INDEPENDENT_AMBULATORY_CARE_PROVIDER_SITE_OTHER): Payer: Medicare Other | Admitting: Family

## 2012-08-09 ENCOUNTER — Encounter: Payer: Self-pay | Admitting: Internal Medicine

## 2012-08-09 ENCOUNTER — Ambulatory Visit (INDEPENDENT_AMBULATORY_CARE_PROVIDER_SITE_OTHER): Payer: Medicare Other | Admitting: Internal Medicine

## 2012-08-09 VITALS — BP 134/82 | HR 84 | Temp 98.0°F | Ht 66.0 in | Wt 155.0 lb

## 2012-08-09 DIAGNOSIS — R739 Hyperglycemia, unspecified: Secondary | ICD-10-CM

## 2012-08-09 DIAGNOSIS — E785 Hyperlipidemia, unspecified: Secondary | ICD-10-CM

## 2012-08-09 DIAGNOSIS — R7309 Other abnormal glucose: Secondary | ICD-10-CM

## 2012-08-09 DIAGNOSIS — N259 Disorder resulting from impaired renal tubular function, unspecified: Secondary | ICD-10-CM

## 2012-08-09 DIAGNOSIS — I4891 Unspecified atrial fibrillation: Secondary | ICD-10-CM

## 2012-08-09 DIAGNOSIS — I10 Essential (primary) hypertension: Secondary | ICD-10-CM

## 2012-08-09 LAB — BASIC METABOLIC PANEL
Calcium: 9.1 mg/dL (ref 8.4–10.5)
GFR: 47.4 mL/min — ABNORMAL LOW (ref >60.00)
Potassium: 3.9 mEq/L (ref 3.5–5.1)
Sodium: 140 mEq/L (ref 135–145)

## 2012-08-09 LAB — POCT INR: INR: 2.4

## 2012-08-09 NOTE — Progress Notes (Signed)
htn- tolerating meds  Lipids-- tolerating meds  afib-- on warfarin and has no bleeding complications.  Stroke - no recurrence  Past Medical History  Diagnosis Date  . Gout   . Hyperlipidemia   . Hypertension   . Stroke   . Colon polyps   . Renal insufficiency   . Anemia     History   Social History  . Marital Status: Married    Spouse Name: N/A    Number of Children: N/A  . Years of Education: N/A   Occupational History  . Not on file.   Social History Main Topics  . Smoking status: Former Games developer  . Smokeless tobacco: Not on file  . Alcohol Use: No  . Drug Use:   . Sexually Active:    Other Topics Concern  . Not on file   Social History Narrative  . No narrative on file    Past Surgical History  Procedure Laterality Date  . Cataract extraction      right eye    No family history on file.  No Known Allergies  Current Outpatient Prescriptions on File Prior to Visit  Medication Sig Dispense Refill  . allopurinol (ZYLOPRIM) 300 MG tablet TAKE 1 TABLET BY MOUTH ONCE A DAY  90 tablet  1  . amLODipine (NORVASC) 5 MG tablet TAKE 1 TABLET BY MOUTH ONCE A DAY  90 tablet  1  . brimonidine (ALPHAGAN) 0.2 % ophthalmic solution 1 drop 3 (three) times daily.      . simvastatin (ZOCOR) 40 MG tablet TAKE ONE TABLET AT BEDTIME  90 tablet  1  . warfarin (COUMADIN) 2.5 MG tablet TAKE ONE ON MONDAY-WEDNESDAY -FRIDAY- OR AS DIRECTED  90 tablet  0   No current facility-administered medications on file prior to visit.     patient denies chest pain, shortness of breath, orthopnea. Denies lower extremity edema, abdominal pain, change in appetite, change in bowel movements. Patient denies rashes, musculoskeletal complaints. No other specific complaints in a complete review of systems.   BP 134/82  Pulse 84  Temp(Src) 98 F (36.7 C) (Oral)  Ht 5\' 6"  (1.676 m)  Wt 155 lb (70.308 kg)  BMI 25.03 kg/m2   well-developed well-nourished male in no acute distress. HEENT exam  atraumatic, normocephalic, neck supple without jugular venous distention. Chest clear to auscultation cardiac exam S1-S2 are regular. Abdominal exam overweight with bowel sounds, soft and nontender. Extremities no edema. Neurologic exam is alert with a normal gait.

## 2012-08-09 NOTE — Addendum Note (Signed)
Addended byAdline Mango B on: 08/09/2012 08:51 AM   Modules accepted: Level of Service

## 2012-08-09 NOTE — Patient Instructions (Addendum)
Continue same dose and recheck in 6 weeks  Anticoagulation Dose Instructions as of 08/09/2012     Glynis Smiles Tue Wed Thu Fri Sat   New Dose 2.5 mg 2.5 mg 2.5 mg 1.25 mg 2.5 mg 2.5 mg 2.5 mg    Description       Continue same dose and recheck in 6 weeks

## 2012-08-11 ENCOUNTER — Encounter: Payer: Self-pay | Admitting: Internal Medicine

## 2012-08-11 NOTE — Assessment & Plan Note (Signed)
Continue warfarin No bleeding complications

## 2012-08-11 NOTE — Assessment & Plan Note (Signed)
BP Readings from Last 3 Encounters:  08/09/12 134/82  02/23/12 120/78  01/18/12 132/80   Reasonable control Continue same meds

## 2012-08-11 NOTE — Assessment & Plan Note (Signed)
Lab Results  Component Value Date   CREATININE 1.5 08/09/2012   Stable, will continue to follow

## 2012-09-20 ENCOUNTER — Ambulatory Visit (INDEPENDENT_AMBULATORY_CARE_PROVIDER_SITE_OTHER): Payer: Medicare Other | Admitting: Family

## 2012-09-20 DIAGNOSIS — I4891 Unspecified atrial fibrillation: Secondary | ICD-10-CM

## 2012-09-20 NOTE — Patient Instructions (Addendum)
Continue same dose and recheck in 6 weeks  Anticoagulation Dose Instructions as of 09/20/2012     Glynis Smiles Tue Wed Thu Fri Sat   New Dose 2.5 mg 2.5 mg 2.5 mg 1.25 mg 2.5 mg 2.5 mg 2.5 mg    Description       Continue same dose and recheck in 6 weeks

## 2012-10-14 ENCOUNTER — Other Ambulatory Visit: Payer: Self-pay | Admitting: Family

## 2012-11-02 ENCOUNTER — Ambulatory Visit (INDEPENDENT_AMBULATORY_CARE_PROVIDER_SITE_OTHER): Payer: Medicare Other | Admitting: General Practice

## 2012-11-02 DIAGNOSIS — Z7901 Long term (current) use of anticoagulants: Secondary | ICD-10-CM

## 2012-11-02 DIAGNOSIS — I4891 Unspecified atrial fibrillation: Secondary | ICD-10-CM

## 2012-11-02 LAB — POCT INR: INR: 2.3

## 2012-11-06 ENCOUNTER — Other Ambulatory Visit: Payer: Self-pay | Admitting: Internal Medicine

## 2012-11-07 ENCOUNTER — Other Ambulatory Visit: Payer: Self-pay | Admitting: Internal Medicine

## 2012-12-01 ENCOUNTER — Other Ambulatory Visit: Payer: Self-pay | Admitting: General Practice

## 2012-12-01 MED ORDER — BRIMONIDINE TARTRATE 0.2 % OP SOLN: 1.0000 [drp] | Freq: Three times a day (TID) | OPHTHALMIC | Status: DC

## 2012-12-14 ENCOUNTER — Ambulatory Visit (INDEPENDENT_AMBULATORY_CARE_PROVIDER_SITE_OTHER): Payer: Medicare Other | Admitting: General Practice

## 2012-12-14 DIAGNOSIS — I4891 Unspecified atrial fibrillation: Secondary | ICD-10-CM

## 2012-12-14 DIAGNOSIS — Z7901 Long term (current) use of anticoagulants: Secondary | ICD-10-CM

## 2012-12-14 LAB — POCT INR: INR: 2.5

## 2012-12-15 ENCOUNTER — Other Ambulatory Visit: Payer: Self-pay | Admitting: Internal Medicine

## 2013-01-18 ENCOUNTER — Ambulatory Visit (INDEPENDENT_AMBULATORY_CARE_PROVIDER_SITE_OTHER): Payer: Medicare Other | Admitting: General Practice

## 2013-01-18 DIAGNOSIS — I4891 Unspecified atrial fibrillation: Secondary | ICD-10-CM

## 2013-01-18 DIAGNOSIS — Z7901 Long term (current) use of anticoagulants: Secondary | ICD-10-CM

## 2013-01-18 LAB — POCT INR: INR: 2.3

## 2013-01-27 ENCOUNTER — Other Ambulatory Visit: Payer: Self-pay | Admitting: Family

## 2013-02-24 ENCOUNTER — Encounter (HOSPITAL_COMMUNITY): Payer: Self-pay | Admitting: Emergency Medicine

## 2013-02-24 ENCOUNTER — Emergency Department (HOSPITAL_COMMUNITY)
Admission: EM | Admit: 2013-02-24 | Discharge: 2013-02-24 | Disposition: A | Discharge status: Home / Self Care | Payer: Medicare Other | Attending: Emergency Medicine | Admitting: Emergency Medicine

## 2013-02-24 ENCOUNTER — Emergency Department (HOSPITAL_COMMUNITY): Payer: Medicare Other

## 2013-02-24 DIAGNOSIS — M25561 Pain in right knee: Secondary | ICD-10-CM

## 2013-02-24 DIAGNOSIS — Y929 Unspecified place or not applicable: Secondary | ICD-10-CM | POA: Insufficient documentation

## 2013-02-24 DIAGNOSIS — IMO0002 Reserved for concepts with insufficient information to code with codable children: Secondary | ICD-10-CM | POA: Insufficient documentation

## 2013-02-24 DIAGNOSIS — E785 Hyperlipidemia, unspecified: Secondary | ICD-10-CM | POA: Insufficient documentation

## 2013-02-24 DIAGNOSIS — Y9389 Activity, other specified: Secondary | ICD-10-CM | POA: Insufficient documentation

## 2013-02-24 DIAGNOSIS — I10 Essential (primary) hypertension: Secondary | ICD-10-CM | POA: Insufficient documentation

## 2013-02-24 DIAGNOSIS — Z862 Personal history of diseases of the blood and blood-forming organs and certain disorders involving the immune mechanism: Secondary | ICD-10-CM | POA: Insufficient documentation

## 2013-02-24 DIAGNOSIS — W010XXA Fall on same level from slipping, tripping and stumbling without subsequent striking against object, initial encounter: Secondary | ICD-10-CM | POA: Insufficient documentation

## 2013-02-24 DIAGNOSIS — Z8719 Personal history of other diseases of the digestive system: Secondary | ICD-10-CM | POA: Insufficient documentation

## 2013-02-24 DIAGNOSIS — S8990XA Unspecified injury of unspecified lower leg, initial encounter: Secondary | ICD-10-CM | POA: Insufficient documentation

## 2013-02-24 DIAGNOSIS — W19XXXA Unspecified fall, initial encounter: Secondary | ICD-10-CM

## 2013-02-24 DIAGNOSIS — Z87448 Personal history of other diseases of urinary system: Secondary | ICD-10-CM | POA: Insufficient documentation

## 2013-02-24 DIAGNOSIS — M549 Dorsalgia, unspecified: Secondary | ICD-10-CM

## 2013-02-24 DIAGNOSIS — Z860100 Personal history of colon polyps, unspecified: Secondary | ICD-10-CM | POA: Insufficient documentation

## 2013-02-24 DIAGNOSIS — Z8673 Personal history of transient ischemic attack (TIA), and cerebral infarction without residual deficits: Secondary | ICD-10-CM | POA: Insufficient documentation

## 2013-02-24 DIAGNOSIS — Z87891 Personal history of nicotine dependence: Secondary | ICD-10-CM | POA: Insufficient documentation

## 2013-02-24 DIAGNOSIS — Z7901 Long term (current) use of anticoagulants: Secondary | ICD-10-CM | POA: Insufficient documentation

## 2013-02-24 DIAGNOSIS — Z79899 Other long term (current) drug therapy: Secondary | ICD-10-CM | POA: Insufficient documentation

## 2013-02-24 DIAGNOSIS — M109 Gout, unspecified: Secondary | ICD-10-CM | POA: Insufficient documentation

## 2013-02-24 MED ORDER — OXYCODONE-ACETAMINOPHEN 5-325 MG PO TABS: 1.0000 | ORAL_TABLET | Freq: Once | ORAL | Status: DC

## 2013-02-24 MED ORDER — OXYCODONE-ACETAMINOPHEN 5-325 MG PO TABS: 1.0000 | ORAL_TABLET | ORAL | Status: DC | PRN

## 2013-02-24 MED ORDER — ACETAMINOPHEN 500 MG PO TABS
1000.0000 mg | ORAL_TABLET | Freq: Once | ORAL | Status: AC
  Administered 2013-02-24: 1000 mg via ORAL
  Filled 2013-02-24: qty 2

## 2013-02-24 MED ORDER — OXYCODONE-ACETAMINOPHEN 5-325 MG PO TABS
1.0000 | ORAL_TABLET | Freq: Once | ORAL | Status: DC
  Filled 2013-02-24: qty 1

## 2013-02-24 NOTE — ED Notes (Signed)
Patient transported to X-ray 

## 2013-02-24 NOTE — ED Notes (Signed)
Pt presents after a fall, which occurred last week. Pt reports pain started the next day, Sunday. Pt reports upper back pain and right knee pain. Pt has swelling around the right knee. Pt is A/O x4 and in NAD, vital signs are WDL.

## 2013-02-24 NOTE — ED Provider Notes (Signed)
CSN: [id number]     Arrival date & time 02/24/13  1340 History   First MD Initiated Contact with Patient 02/24/13 1405     Chief Complaint  Patient presents with  . Fall  . Back Pain  . Knee Injury   (Consider location/radiation/quality/duration/timing/severity/associated sxs/prior Treatment) HPI Pt presenting with pain in upper back and right knee after fall one week ago. Pt states the pain began 1 day after the fall.  He describes putting gas in his car and tripping over the gasoline hose, falling onto his right side.  He states he did not feel any pain until the next day.  Now has pain in upper back to the left of midline and pain and swelling in his right knee.  Pain with movement and bearing weight.  Did not strike head.  No neck or low back pain.  There are no other associated systemic symptoms, there are no other alleviating or modifying factors.   Past Medical History  Diagnosis Date  . Gout   . Hyperlipidemia   . Hypertension   . Stroke   . Colon polyps   . Renal insufficiency   . Anemia   . ACUT DUOD ULCER W/HEMORR W/O MENTION OBSTRUCTION 11/24/2007    Qualifier: Diagnosis of  By: Alwyn Ren MD, Chrissie Noa     Past Surgical History  Procedure Laterality Date  . Cataract extraction      right eye   No family history on file. History  Substance Use Topics  . Smoking status: Former Games developer  . Smokeless tobacco: Not on file  . Alcohol Use: No    Review of Systems ROS reviewed and all otherwise negative except for mentioned in HPI  Allergies  Review of patient's allergies indicates no known allergies.  Home Medications   Current Outpatient Rx  Name  Route  Sig  Dispense  Refill  . allopurinol (ZYLOPRIM) 100 MG tablet   Oral   Take 100 mg by mouth daily.         Marland Kitchen amLODipine (NORVASC) 5 MG tablet   Oral   Take 5 mg by mouth daily.         . brimonidine (ALPHAGAN) 0.2 % ophthalmic solution   Ophthalmic   Apply 1 drop to eye 3 (three) times daily.   5 mL   2    . simvastatin (ZOCOR) 40 MG tablet   Oral   Take 40 mg by mouth at bedtime.         Marland Kitchen warfarin (COUMADIN) 2.5 MG tablet   Oral   Take 1.25-2.5 mg by mouth every evening. 2.5 mg on everyday wed take 1.25mg          . oxyCODONE-acetaminophen (ROXICET) 5-325 MG per tablet   Oral   Take 1-2 tablets by mouth every 4 (four) hours as needed for severe pain.   20 tablet   0    BP 130/47  Pulse 70  Temp(Src) 98.5 F (36.9 C) (Oral)  Resp 16  SpO2 94% Vitals reviewed Physical Exam Physical Examination: General appearance - alert, well appearing, and in no distress Mental status - alert, oriented to person, place, and time Eyes - no conjunctival injection, no scleral icterus Mouth - mucous membranes moist, pharynx normal without lesions Neck - no midline tenderness to palpation, FROM without pain Chest - clear to auscultation, no wheezes, rales or rhonchi, symmetric air entry Heart - normal rate, regular rhythm, normal S1, S2, no murmurs, rubs, clicks or gallops Abdomen - soft,  nontender, nondistended, no masses or organomegaly Back exam - no midline tenderness of c/t/l spine, ttp over left thoracic paraspinous region Musculoskeletal - right knee with ttp over anterior patella and inferior to patella, mild swelling superior to patella, no palpable effusion, 5/5 strength of flexion/extension of the knee, no overlying erythema or warmth, otherwise no joint tenderness, deformity or swelling Extremities - peripheral pulses normal, no pedal edema, no clubbing or cyanosis Skin - normal coloration and turgor, no rashes  ED Course  Procedures (including critical care time) Labs Review Labs Reviewed - No data to display Imaging Review Dg Thoracic Spine 2 View  02/24/2013   CLINICAL DATA:  Upper thoracic spine pain secondary to a fall 4 days ago.  EXAM: THORACIC SPINE - 2 VIEW  COMPARISON:  Chest x-ray dated 09/05/2011 and chest CT dated 09/17/2008  FINDINGS: There is no evidence of  thoracic spine fracture. Alignment is normal. No other significant bone abnormalities are identified.  IMPRESSION: No significant abnormality. Degenerative osteophytes primarily to the right in the mid and lower thoracic spine.   Electronically Signed   By: Geanie Cooley M.D.   On: 02/24/2013 15:26   Dg Knee Complete 4 Views Right  02/24/2013   CLINICAL DATA:  Right knee pain secondary to a fall 4 days ago.  EXAM: RIGHT KNEE - COMPLETE 4+ VIEW  COMPARISON:  None.  FINDINGS: There is no fracture or dislocation. There is abnormal soft tissue density in the region of the distal quadriceps tendon and in the suprapatellar recess. The possibility of distal quadriceps tendon injury should be considered.  The patient does have moderate tricompartmental osteoarthritis.  IMPRESSION: No acute osseous abnormality.  Possible distal quadriceps injury.   Electronically Signed   By: Geanie Cooley M.D.   On: 02/24/2013 15:24    EKG Interpretation   None       MDM   1. Fall, initial encounter   2. Upper back pain   3. Right knee pain    Pt presenting with pain in upper back and right knee after fall.  Xray reassuring of thoracic spine- suspect muscle spasm there.  Right knee without bruising or significant effusion or swelling.  Xray reassuring, state spossible distal quadriceps injury, no ttp over region of distal quadricpes, however will place in knee immobilizer and have him f/u with orthopedics.  Pt offered percocet but declined.  Discharged with strict return precautions.  Pt agreeable with plan.    Ethelda Chick, MD 02/25/13 [phone]

## 2013-02-24 NOTE — ED Notes (Signed)
Discharge instructions explained to pt and wife; they verbalized understanding instructions; a copy of discharge instructions were given to patient along w/ a prescription for percocet.

## 2013-02-24 NOTE — ED Notes (Signed)
MD at bedside. 

## 2013-03-01 ENCOUNTER — Ambulatory Visit: Payer: Medicare Other

## 2013-03-07 ENCOUNTER — Other Ambulatory Visit: Payer: Self-pay | Admitting: *Deleted

## 2013-03-07 MED ORDER — BRIMONIDINE TARTRATE 0.2 % OP SOLN: 1.0000 [drp] | Freq: Three times a day (TID) | OPHTHALMIC | Status: DC

## 2013-03-12 ENCOUNTER — Other Ambulatory Visit: Payer: Self-pay | Admitting: Internal Medicine

## 2013-03-18 ENCOUNTER — Other Ambulatory Visit: Payer: Self-pay | Admitting: Internal Medicine

## 2013-04-04 ENCOUNTER — Ambulatory Visit (INDEPENDENT_AMBULATORY_CARE_PROVIDER_SITE_OTHER): Payer: Medicare Other | Admitting: Family

## 2013-04-04 DIAGNOSIS — I4891 Unspecified atrial fibrillation: Secondary | ICD-10-CM

## 2013-04-04 LAB — POCT INR: INR: 2.8

## 2013-04-04 NOTE — Patient Instructions (Signed)
Continue to take 1 tablet daily except 1/2 tablet on Wed.  Re-check in 6 weeks.  Anticoagulation Dose Instructions as of 04/04/2013     Glynis SmilesSun Mon Tue Wed Thu Fri Sat   New Dose 2.5 mg 2.5 mg 2.5 mg 1.25 mg 2.5 mg 2.5 mg 2.5 mg    Description       Continue to take 1 tablet daily except 1/2 tablet on Wed.  Re-check in 6 weeks.

## 2013-04-05 ENCOUNTER — Ambulatory Visit: Payer: Medicare Other

## 2013-04-30 ENCOUNTER — Other Ambulatory Visit: Payer: Self-pay | Admitting: General Practice

## 2013-04-30 ENCOUNTER — Other Ambulatory Visit: Payer: Self-pay | Admitting: Family

## 2013-05-17 ENCOUNTER — Ambulatory Visit: Payer: Medicare Other

## 2013-05-22 ENCOUNTER — Ambulatory Visit (INDEPENDENT_AMBULATORY_CARE_PROVIDER_SITE_OTHER): Payer: Medicare Other | Admitting: Family

## 2013-05-22 DIAGNOSIS — I4891 Unspecified atrial fibrillation: Secondary | ICD-10-CM

## 2013-05-22 LAB — POCT INR: INR: 2.1

## 2013-05-22 NOTE — Patient Instructions (Signed)
Anticoagulation Dose Instructions as of 05/22/2013     Glynis SmilesSun Mon Tue Wed Thu Fri Sat   New Dose 2.5 mg 2.5 mg 2.5 mg 1.25 mg 2.5 mg 2.5 mg 2.5 mg    Description       Continue to take 1 tablet daily except 1/2 tablet on Wed.  Re-check in 6 weeks.

## 2013-06-12 ENCOUNTER — Other Ambulatory Visit: Payer: Self-pay | Admitting: Internal Medicine

## 2013-07-05 ENCOUNTER — Ambulatory Visit (INDEPENDENT_AMBULATORY_CARE_PROVIDER_SITE_OTHER): Payer: Medicare Other | Admitting: General Practice

## 2013-07-05 DIAGNOSIS — I4891 Unspecified atrial fibrillation: Secondary | ICD-10-CM

## 2013-07-05 DIAGNOSIS — Z5181 Encounter for therapeutic drug level monitoring: Secondary | ICD-10-CM

## 2013-07-05 LAB — POCT INR: INR: 2.5

## 2013-07-05 NOTE — Progress Notes (Signed)
Pre visit review using our clinic review tool, if applicable. No additional management support is needed unless otherwise documented below in the visit note. 

## 2013-07-22 ENCOUNTER — Encounter (HOSPITAL_COMMUNITY): Payer: Self-pay | Admitting: Emergency Medicine

## 2013-07-22 ENCOUNTER — Emergency Department (HOSPITAL_COMMUNITY)
Admission: EM | Admit: 2013-07-22 | Discharge: 2013-07-22 | Discharge status: Against Medical Advice | Payer: Medicare Other | Attending: Emergency Medicine | Admitting: Emergency Medicine

## 2013-07-22 DIAGNOSIS — N189 Chronic kidney disease, unspecified: Secondary | ICD-10-CM | POA: Insufficient documentation

## 2013-07-22 DIAGNOSIS — Z860100 Personal history of colon polyps, unspecified: Secondary | ICD-10-CM | POA: Insufficient documentation

## 2013-07-22 DIAGNOSIS — Z8673 Personal history of transient ischemic attack (TIA), and cerebral infarction without residual deficits: Secondary | ICD-10-CM | POA: Insufficient documentation

## 2013-07-22 DIAGNOSIS — Z8719 Personal history of other diseases of the digestive system: Secondary | ICD-10-CM | POA: Insufficient documentation

## 2013-07-22 DIAGNOSIS — I959 Hypotension, unspecified: Secondary | ICD-10-CM | POA: Insufficient documentation

## 2013-07-22 DIAGNOSIS — Z862 Personal history of diseases of the blood and blood-forming organs and certain disorders involving the immune mechanism: Secondary | ICD-10-CM | POA: Insufficient documentation

## 2013-07-22 DIAGNOSIS — I4460 Unspecified fascicular block: Secondary | ICD-10-CM | POA: Insufficient documentation

## 2013-07-22 DIAGNOSIS — Z87891 Personal history of nicotine dependence: Secondary | ICD-10-CM | POA: Insufficient documentation

## 2013-07-22 DIAGNOSIS — E785 Hyperlipidemia, unspecified: Secondary | ICD-10-CM | POA: Insufficient documentation

## 2013-07-22 DIAGNOSIS — I129 Hypertensive chronic kidney disease with stage 1 through stage 4 chronic kidney disease, or unspecified chronic kidney disease: Secondary | ICD-10-CM | POA: Insufficient documentation

## 2013-07-22 DIAGNOSIS — I4510 Unspecified right bundle-branch block: Secondary | ICD-10-CM | POA: Insufficient documentation

## 2013-07-22 DIAGNOSIS — I444 Left anterior fascicular block: Secondary | ICD-10-CM

## 2013-07-22 DIAGNOSIS — M109 Gout, unspecified: Secondary | ICD-10-CM | POA: Insufficient documentation

## 2013-07-22 DIAGNOSIS — R61 Generalized hyperhidrosis: Secondary | ICD-10-CM | POA: Insufficient documentation

## 2013-07-22 DIAGNOSIS — Z7901 Long term (current) use of anticoagulants: Secondary | ICD-10-CM | POA: Insufficient documentation

## 2013-07-22 DIAGNOSIS — Z79899 Other long term (current) drug therapy: Secondary | ICD-10-CM | POA: Insufficient documentation

## 2013-07-22 DIAGNOSIS — R55 Syncope and collapse: Secondary | ICD-10-CM | POA: Insufficient documentation

## 2013-07-22 DIAGNOSIS — R0602 Shortness of breath: Secondary | ICD-10-CM | POA: Insufficient documentation

## 2013-07-22 LAB — BASIC METABOLIC PANEL
BUN: 19 mg/dL (ref 6–23)
CHLORIDE: 103 meq/L (ref 96–112)
CO2: 22 mEq/L (ref 19–32)
Calcium: 9.2 mg/dL (ref 8.4–10.5)
Creatinine, Ser: 1.58 mg/dL — ABNORMAL HIGH (ref 0.50–1.35)
GFR calc non Af Amer: 40 mL/min — ABNORMAL LOW (ref >90)
GFR, EST AFRICAN AMERICAN: 47 mL/min — AB (ref >90)
Glucose, Bld: 110 mg/dL — ABNORMAL HIGH (ref 70–99)
POTASSIUM: 3.9 meq/L (ref 3.7–5.3)
Sodium: 141 mEq/L (ref 137–147)

## 2013-07-22 LAB — CBC WITH DIFFERENTIAL/PLATELET
BASOS PCT: 0 % (ref 0–1)
Basophils Absolute: 0 10*3/uL (ref 0.0–0.1)
EOS ABS: 0.2 10*3/uL (ref 0.0–0.7)
Eosinophils Relative: 2 % (ref 0–5)
HEMATOCRIT: 40.1 % (ref 39.0–52.0)
HEMOGLOBIN: 13.8 g/dL (ref 13.0–17.0)
Lymphocytes Relative: 19 % (ref 12–46)
Lymphs Abs: 3 10*3/uL (ref 0.7–4.0)
MCH: 31.9 pg (ref 26.0–34.0)
MCHC: 34.4 g/dL (ref 30.0–36.0)
MCV: 92.6 fL (ref 78.0–100.0)
MONO ABS: 1.4 10*3/uL — AB (ref 0.1–1.0)
MONOS PCT: 9 % (ref 3–12)
NEUTROS ABS: 11.5 10*3/uL — AB (ref 1.7–7.7)
Neutrophils Relative %: 70 % (ref 43–77)
Platelets: 322 10*3/uL (ref 150–400)
RBC: 4.33 MIL/uL (ref 4.22–5.81)
RDW: 14.2 % (ref 11.5–15.5)
WBC: 16.3 10*3/uL — ABNORMAL HIGH (ref 4.0–10.5)

## 2013-07-22 LAB — I-STAT CHEM 8, ED
BUN: 19 mg/dL (ref 6–23)
CHLORIDE: 106 meq/L (ref 96–112)
CREATININE: 1.7 mg/dL — AB (ref 0.50–1.35)
Calcium, Ion: 1.15 mmol/L (ref 1.13–1.30)
Glucose, Bld: 114 mg/dL — ABNORMAL HIGH (ref 70–99)
HCT: 44 % (ref 39.0–52.0)
Hemoglobin: 15 g/dL (ref 13.0–17.0)
POTASSIUM: 3.7 meq/L (ref 3.7–5.3)
SODIUM: 142 meq/L (ref 137–147)
TCO2: 24 mmol/L (ref 0–100)

## 2013-07-22 LAB — I-STAT TROPONIN, ED: Troponin i, poc: 0.01 ng/mL (ref 0.00–0.08)

## 2013-07-22 NOTE — Discharge Instructions (Signed)
Syncope Syncope is a fainting spell. This means the person loses consciousness and drops to the ground. The person is generally unconscious for less than 5 minutes. The person may have some muscle twitches for up to 15 seconds before waking up and returning to normal. Syncope occurs more often in elderly people, but it can happen to anyone. While most causes of syncope are not dangerous, syncope can be a sign of a serious medical problem. It is important to seek medical care.  CAUSES  Syncope is caused by a sudden decrease in blood flow to the brain. The specific cause is often not determined. Factors that can trigger syncope include:  Taking medicines that lower blood pressure.  Sudden changes in posture, such as standing up suddenly.  Taking more medicine than prescribed.  Standing in one place for too long.  Seizure disorders.  Dehydration and excessive exposure to heat.  Low blood sugar (hypoglycemia).  Straining to have a bowel movement.  Heart disease, irregular heartbeat, or other circulatory problems.  Fear, emotional distress, seeing blood, or severe pain. SYMPTOMS  Right before fainting, you may:  Feel dizzy or lightheaded.  Feel nauseous.  See all white or all black in your field of vision.  Have cold, clammy skin. DIAGNOSIS  Your caregiver will ask about your symptoms, perform a physical exam, and perform electrocardiography (ECG) to record the electrical activity of your heart. Your caregiver may also perform other heart or blood tests to determine the cause of your syncope. TREATMENT  In most cases, no treatment is needed. Depending on the cause of your syncope, your caregiver may recommend changing or stopping some of your medicines. HOME CARE INSTRUCTIONS  Have someone stay with you until you feel stable.  Do not drive, operate machinery, or play sports until your caregiver says it is okay.  Keep all follow-up appointments as directed by your  caregiver.  Lie down right away if you start feeling like you might faint. Breathe deeply and steadily. Wait until all the symptoms have passed.  Drink enough fluids to keep your urine clear or pale yellow.  If you are taking blood pressure or heart medicine, get up slowly, taking several minutes to sit and then stand. This can reduce dizziness. SEEK IMMEDIATE MEDICAL CARE IF:   You have a severe headache.  You have unusual pain in the chest, abdomen, or back.  You are bleeding from the mouth or rectum, or you have black or tarry stool.  You have an irregular or very fast heartbeat.  You have pain with breathing.  You have repeated fainting or seizure-like jerking during an episode.  You faint when sitting or lying down.  You have confusion.  You have difficulty walking.  You have severe weakness.  You have vision problems. If you fainted, call your local emergency services (911 in [place] Do not drive yourself to the hospital.  MAKE SURE YOU:  Understand these instructions.  Will watch your condition.  Will get help right away if you are not doing well or get worse. Document Released: 03/08/2005 Document Revised: 09/07/2011 Document Reviewed: 05/07/2011 [facility] Patient Information 2014 [place], [place].   I have recommended that she stay in the hospital for further evaluation and workup for this episode of passing out today. He decided to leave the hospital against medical device. There are risks of leaving the hospital as we do not know if you have had any life-threatening event. These risks including significant disability or pain and death. Please  return to the hospital if you've had chest pain, shortness of breath, passed out again, some swelling, vomiting, blood in her stool or black tarry stools, numbness or weakness on one side of your body, severe headache.

## 2013-07-22 NOTE — ED Provider Notes (Signed)
TIME SEEN: 2:00 PM  CHIEF COMPLAINT: Syncope  HPI: Patient is a 80 year old with history of hypertension, hyperlipidemia, chronic kidney disease, prior stroke on Coumadin who presents to the emergency department with a syncopal event. Per bystander reports, patient was sitting a lot at church today when he became diaphoretic and syncopized. He was unconscious for several seconds. There was no known seizure activity. No incontinence or tongue biting. Patient was initially hypotensive at the scene. He states he is feeling much better and has no complaints. He denies any chest pain but may have had some mild shortness of breath. No palpitations. No headache, numbness or focal weakness. No recent head injury. No fevers, cough, vomiting or diarrhea. No dysuria or hematuria.  ROS: See HPI Constitutional: no fever  Eyes: no drainage  ENT: no runny nose   Cardiovascular:  no chest pain  Resp: no SOB  GI: no vomiting GU: no dysuria Integumentary: no rash  Allergy: no hives  Musculoskeletal: no leg swelling  Neurological: no slurred speech ROS otherwise negative  PAST MEDICAL HISTORY/PAST SURGICAL HISTORY:  Past Medical History  Diagnosis Date  . Gout   . Hyperlipidemia   . Hypertension   . Colon polyps   . Renal insufficiency   . Anemia   . ACUT DUOD ULCER W/HEMORR W/O MENTION OBSTRUCTION 11/24/2007    Qualifier: Diagnosis of  By: Alwyn RenHopper MD, [name]    . Stroke     no deficits    MEDICATIONS:  Prior to Admission medications   Medication Sig Start Date End Date Taking? Authorizing Provider  allopurinol (ZYLOPRIM) 300 MG tablet TAKE 1 TABLET BY MOUTH ONCE A DAY 03/18/13   Lindley MagnusBruce H Swords, MD  amLODipine (NORVASC) 5 MG tablet TAKE 1 TABLET BY MOUTH ONCE A DAY 03/12/13   Lindley MagnusBruce H Swords, MD  brimonidine (ALPHAGAN) 0.2 % ophthalmic solution PLACE 1 DROP INTO BOTH EYES 3 (THREE) TIMES DAILY. 06/12/13   Lindley MagnusBruce H Swords, MD  oxyCODONE-acetaminophen (ROXICET) 5-325 MG per tablet Take 1-2 tablets by  mouth every 4 (four) hours as needed for severe pain. 02/24/13   Ethelda ChickMartha K Linker, MD  simvastatin (ZOCOR) 40 MG tablet Take 40 mg by mouth at bedtime.    Historical Provider, MD  warfarin (COUMADIN) 2.5 MG tablet Take as directed by anticoagulation clinic 04/30/13   Lindley MagnusBruce H Swords, MD    ALLERGIES:  No Known Allergies  SOCIAL HISTORY:  History  Substance Use Topics  . Smoking status: Former Games developermoker  . Smokeless tobacco: Not on file  . Alcohol Use: No    FAMILY HISTORY: No family history on file.  EXAM: BP 120/64  Temp(Src) 97.8 F (36.6 C) (Oral)  Resp 16  Ht 5\' 6"  (1.676 m)  Wt 145 lb (65.772 kg)  BMI 23.41 kg/m2  SpO2 100% CONSTITUTIONAL: Alert and oriented and responds appropriately to questions. Well-appearing; well-nourished HEAD: Normocephalic EYES: Conjunctivae clear, PERRL ENT: normal nose; no rhinorrhea; moist mucous membranes; pharynx without lesions noted NECK: Supple, no meningismus, no LAD  CARD: RRR; S1 and S2 appreciated; no murmurs, no clicks, no rubs, no gallops RESP: Normal chest excursion without splinting or tachypnea; breath sounds clear and equal bilaterally; no wheezes, no rhonchi, no rales,  ABD/GI: Normal bowel sounds; non-distended; soft, non-tender, no rebound, no guarding BACK:  The back appears normal and is non-tender to palpation, there is no CVA tenderness EXT: Normal ROM in all joints; non-tender to palpation; no edema; normal capillary refill; no cyanosis    SKIN: Normal color  for age and race; warm NEURO: Moves all extremities equally migraine are 2 through 12 intact, sensation to light touch intact diffusely PSYCH: The patient's mood and manner are appropriate. Grooming and personal hygiene are appropriate.  MEDICAL DECISION MAKING: Patient here with syncopal event. He has a new bifascicular block seen on EKG compared to prior. His cardiac labs are unremarkable. He has a leukocytosis of 16.3. Have recommended to patient that we obtain chest  x-ray, urinalysis to evaluate for infection, check orthostatic vital signs, and admit for syncopal workup. Given he has new EKG changes and significant risk factors, I do not feel that this patient is safe to be discharged home. He is however oriented x3, not intoxicated and is able to understand the risk of leaving the hospital. Have discussed with patient that he would be leaving AGAINST MEDICAL ADVICE. Have urged him to stay. He states he'll followup with his primary care physician as outpatient. He does not want further workup at this time. He is able to verbalize risks back to me. We'll have patient sign out AGAINST MEDICAL ADVICE. Have discussed return precautions. Patient verbalizes understanding and is comfortable with this plan.      Date: 07/22/2013 12:29 PM  Rate: 82  Rhythm: normal sinus rhythm  QRS Axis: normal  Intervals: Right Bundle branch block, left anterior fascicular block  ST/T Wave abnormalities: normal  Conduction Disutrbances: none  Narrative Interpretation: Patient with bifascicular block that is new compared to EKG in 2009      Rae Plotner N Ezariah Nace, DO 07/22/13 1429

## 2013-07-22 NOTE — ED Notes (Signed)
Per EMS - pt was sitting in church when he became pale and diaphoretic, then lost consciousness for a few seconds. Pt reports he didn't feel good prior to this happening but never had CP. EMT at church initial BP was in 80s but has increased on its own. BP 120/70 HR NSR with RBB. CBG 119. Denies hitting head. Witnesses said pt didn't fall out of chair. Pt asked to not have an IV.

## 2013-08-08 ENCOUNTER — Other Ambulatory Visit: Payer: Self-pay | Admitting: Internal Medicine

## 2013-08-16 ENCOUNTER — Ambulatory Visit: Payer: Medicare Other

## 2013-08-20 ENCOUNTER — Ambulatory Visit: Payer: Medicare Other

## 2013-08-23 ENCOUNTER — Ambulatory Visit (INDEPENDENT_AMBULATORY_CARE_PROVIDER_SITE_OTHER): Payer: Medicare Other | Admitting: General Practice

## 2013-08-23 DIAGNOSIS — I4891 Unspecified atrial fibrillation: Secondary | ICD-10-CM

## 2013-08-23 DIAGNOSIS — Z5181 Encounter for therapeutic drug level monitoring: Secondary | ICD-10-CM

## 2013-08-23 LAB — POCT INR: INR: 2.7

## 2013-08-23 NOTE — Progress Notes (Signed)
Pre visit review using our clinic review tool, if applicable. No additional management support is needed unless otherwise documented below in the visit note. 

## 2013-09-26 ENCOUNTER — Other Ambulatory Visit: Payer: Self-pay | Admitting: Internal Medicine

## 2013-09-27 ENCOUNTER — Other Ambulatory Visit: Payer: Self-pay | Admitting: Internal Medicine

## 2013-09-28 ENCOUNTER — Other Ambulatory Visit: Payer: Self-pay | Admitting: Internal Medicine

## 2013-09-29 ENCOUNTER — Other Ambulatory Visit: Payer: Self-pay | Admitting: Internal Medicine

## 2013-10-04 ENCOUNTER — Ambulatory Visit (INDEPENDENT_AMBULATORY_CARE_PROVIDER_SITE_OTHER): Payer: Medicare Other | Admitting: General Practice

## 2013-10-04 DIAGNOSIS — I4891 Unspecified atrial fibrillation: Secondary | ICD-10-CM

## 2013-10-04 DIAGNOSIS — Z5181 Encounter for therapeutic drug level monitoring: Secondary | ICD-10-CM

## 2013-10-04 LAB — POCT INR: INR: 3.9

## 2013-10-04 NOTE — Progress Notes (Signed)
Pre visit review using our clinic review tool, if applicable. No additional management support is needed unless otherwise documented below in the visit note. 

## 2013-10-10 ENCOUNTER — Ambulatory Visit (INDEPENDENT_AMBULATORY_CARE_PROVIDER_SITE_OTHER): Payer: Medicare Other | Admitting: Physician Assistant

## 2013-10-10 ENCOUNTER — Encounter: Payer: Self-pay | Admitting: Physician Assistant

## 2013-10-10 VITALS — BP 90/64 | HR 78 | Temp 98.4°F | Resp 18 | Wt 150.0 lb

## 2013-10-10 DIAGNOSIS — E785 Hyperlipidemia, unspecified: Secondary | ICD-10-CM

## 2013-10-10 DIAGNOSIS — I4891 Unspecified atrial fibrillation: Secondary | ICD-10-CM

## 2013-10-10 DIAGNOSIS — I10 Essential (primary) hypertension: Secondary | ICD-10-CM

## 2013-10-10 DIAGNOSIS — Z09 Encounter for follow-up examination after completed treatment for conditions other than malignant neoplasm: Secondary | ICD-10-CM

## 2013-10-10 DIAGNOSIS — M109 Gout, unspecified: Secondary | ICD-10-CM

## 2013-10-10 LAB — CBC WITH DIFFERENTIAL/PLATELET
BASOS PCT: 0.4 % (ref 0.0–3.0)
Basophils Absolute: 0.1 10*3/uL (ref 0.0–0.1)
Eosinophils Absolute: 0.4 10*3/uL (ref 0.0–0.7)
Eosinophils Relative: 2.5 % (ref 0.0–5.0)
HCT: 40 % (ref 39.0–52.0)
Hemoglobin: 13.3 g/dL (ref 13.0–17.0)
Lymphocytes Relative: 26.3 % (ref 12.0–46.0)
Lymphs Abs: 3.7 10*3/uL (ref 0.7–4.0)
MCHC: 33.3 g/dL (ref 30.0–36.0)
MCV: 93.6 fl (ref 78.0–100.0)
MONOS PCT: 7.3 % (ref 3.0–12.0)
Monocytes Absolute: 1 10*3/uL (ref 0.1–1.0)
NEUTROS ABS: 8.9 10*3/uL — AB (ref 1.4–7.7)
Neutrophils Relative %: 63.5 % (ref 43.0–77.0)
Platelets: 302 10*3/uL (ref 150.0–400.0)
RBC: 4.28 Mil/uL (ref 4.22–5.81)
RDW: 16.1 % — ABNORMAL HIGH (ref 11.5–15.5)
WBC: 14.1 10*3/uL — ABNORMAL HIGH (ref 4.0–10.5)

## 2013-10-10 LAB — HEPATIC FUNCTION PANEL
ALT: 23 U/L (ref 0–53)
AST: 34 U/L (ref 0–37)
Albumin: 3.6 g/dL (ref 3.5–5.2)
Alkaline Phosphatase: 135 U/L — ABNORMAL HIGH (ref 39–117)
Bilirubin, Direct: 0 mg/dL (ref 0.0–0.3)
TOTAL PROTEIN: 8.4 g/dL — AB (ref 6.0–8.3)
Total Bilirubin: 0.6 mg/dL (ref 0.2–1.2)

## 2013-10-10 LAB — LIPID PANEL
CHOLESTEROL: 146 mg/dL (ref 0–200)
HDL: 38.8 mg/dL — ABNORMAL LOW (ref >39.00)
LDL Cholesterol: 87 mg/dL (ref 0–99)
NonHDL: 107.2
TRIGLYCERIDES: 103 mg/dL (ref 0.0–149.0)
Total CHOL/HDL Ratio: 4
VLDL: 20.6 mg/dL (ref 0.0–40.0)

## 2013-10-10 LAB — POCT URINALYSIS DIPSTICK
BILIRUBIN UA: NEGATIVE
Glucose, UA: NEGATIVE
KETONES UA: NEGATIVE
LEUKOCYTES UA: NEGATIVE
Nitrite, UA: NEGATIVE
Spec Grav, UA: 1.025
Urobilinogen, UA: 0.2
pH, UA: 5.5

## 2013-10-10 LAB — BASIC METABOLIC PANEL
BUN: 20 mg/dL (ref 6–23)
CO2: 27 meq/L (ref 19–32)
CREATININE: 1.7 mg/dL — AB (ref 0.4–1.5)
Calcium: 9.4 mg/dL (ref 8.4–10.5)
Chloride: 105 mEq/L (ref 96–112)
GFR: 41.53 mL/min — ABNORMAL LOW (ref >60.00)
Glucose, Bld: 83 mg/dL (ref 70–99)
Potassium: 4.2 mEq/L (ref 3.5–5.1)
Sodium: 138 mEq/L (ref 135–145)

## 2013-10-10 LAB — URIC ACID: Uric Acid, Serum: 4.9 mg/dL (ref 4.0–7.8)

## 2013-10-10 MED ORDER — ALLOPURINOL 300 MG PO TABS: ORAL_TABLET | ORAL | Status: DC

## 2013-10-10 MED ORDER — AMLODIPINE BESYLATE 5 MG PO TABS: ORAL_TABLET | ORAL | Status: DC

## 2013-10-10 MED ORDER — SIMVASTATIN 40 MG PO TABS: 40.0000 mg | ORAL_TABLET | Freq: Every day | ORAL | Status: DC

## 2013-10-10 NOTE — Assessment & Plan Note (Deleted)
Stable on amlodipine 

## 2013-10-10 NOTE — Progress Notes (Signed)
Pre visit review using our clinic review tool, if applicable. No additional management support is needed unless otherwise documented below in the visit note. 

## 2013-10-10 NOTE — Progress Notes (Signed)
Subjective:    Patient ID: Daniel [name], male    DOB: September 13, 1932, 80 y.o.   MRN: [id number]  HPI Patient is a 80 y.o. male presenting for medication management. Afib- Hx of stroke many years ago. Treated with coumadin by coumadin clinic. No adverse effects, tolerating well. No bleeding complaints.  HTN- treated well with amlodipine. Pt denies adverse effects to treatment, tolerating well.  GOUT- treated with allopurinol. Pt denies adverse effects, states he is tolerating well. Helping to reduce his acute flares. Hasn't had a bad GOUT flare in over year.  HLD- stable, treated with Zocor. Pt denies adverse effects to treamtent, states he is tolerating this well.  Patient denies fevers, chills, nausea, vomiting, diarrhea, shortness of breath, chest pain, headaches, syncope.   Review of Systems As per HPI and are otherwise negative.    Past Medical History  Diagnosis Date  . Gout   . Hyperlipidemia   . Hypertension   . Colon polyps   . Renal insufficiency   . Anemia   . ACUT DUOD ULCER W/HEMORR W/O MENTION OBSTRUCTION 11/24/2007    Qualifier: Diagnosis of  By: Alwyn Ren MD, Chrissie Noa    . Stroke     no deficits    History   Social History  . Marital Status: Married    Spouse Name: N/A    Number of Children: N/A  . Years of Education: N/A   Occupational History  . Not on file.   Social History Main Topics  . Smoking status: Former Games developer  . Smokeless tobacco: Not on file  . Alcohol Use: No  . Drug Use: Not on file  . Sexual Activity: Not on file   Other Topics Concern  . Not on file   Social History Narrative  . No narrative on file    Past Surgical History  Procedure Laterality Date  . Cataract extraction      right eye    No family history on file.  No Known Allergies  Current Outpatient Prescriptions on File Prior to Visit  Medication Sig Dispense Refill  . warfarin (COUMADIN) 2.5 MG tablet Take as directed by anticoagulation clinic  90 tablet  1    No current facility-administered medications on file prior to visit.    EXAM: BP 90/64  Pulse 78  Temp(Src) 98.4 F (36.9 C) (Oral)  Resp 18  Wt 150 lb (68.04 kg)     Objective:   Physical Exam  Nursing note and vitals reviewed. Constitutional: He is oriented to person, place, and time. He appears well-developed and well-nourished. No distress.  HENT:  Head: Normocephalic and atraumatic.  Eyes: Conjunctivae and EOM are normal. Pupils are equal, round, and reactive to light.  Cardiovascular: Normal rate, regular rhythm and intact distal pulses.   Pulmonary/Chest: Effort normal and breath sounds normal. No respiratory distress. He exhibits no tenderness.  Musculoskeletal: He exhibits no edema.  Neurological: He is alert and oriented to person, place, and time.  Skin: Skin is warm and dry. No rash noted. He is not diaphoretic. No erythema. No pallor.  Psychiatric: He has a normal mood and affect. His behavior is normal. Judgment and thought content normal.    Lab Results  Component Value Date   WBC 16.3* 07/22/2013   HGB 15.0 07/22/2013   HCT 44.0 07/22/2013   PLT 322 07/22/2013   GLUCOSE 114* 07/22/2013   CHOL 170 01/18/2012   TRIG 141.0 01/18/2012   HDL 43.50 01/18/2012   LDLDIRECT 160.7 09/13/2008  LDLCALC 98 01/18/2012   ALT 23 01/18/2012   AST 29 01/18/2012   NA 142 07/22/2013   K 3.7 07/22/2013   CL 106 07/22/2013   CREATININE 1.70* 07/22/2013   BUN 19 07/22/2013   CO2 22 07/22/2013   TSH 2.38 05/12/2011   INR 3.9 10/04/2013   HGBA1C 6.0 08/09/2012         Assessment & Plan:  Daniel [name] was seen today for medication management.  Diagnoses and associated orders for this visit:  Follow up Comments: Over all doing well, tolerating medications well, stable conditions. Will obtain lab work. - CBC with Differential - Basic Metabolic Panel - Hepatic function panel - Lipid Panel - Uric Acid - POC Urinalysis Dipstick  Unspecified essential hypertension Comments: Stable on  amlodipine, continue. - CBC with Differential - Basic Metabolic Panel - Hepatic function panel - Lipid Panel - Uric Acid - POC Urinalysis Dipstick  Other and unspecified hyperlipidemia Comments: On Zocor, no adverse effects, continue. - CBC with Differential - Basic Metabolic Panel - Hepatic function panel - Lipid Panel - Uric Acid - POC Urinalysis Dipstick  Gout, unspecified cause, unspecified chronicity, unspecified site Comments: Stable on allopurinol. No acute flare in over a year. Continue. - Uric Acid  Atrial fibrillation, unspecified Comments: Normal rate and rhythm on exam. Managed by Coumadin clinic. No bleeding complaints.  Other Orders - simvastatin (ZOCOR) 40 MG tablet; Take 1 tablet (40 mg total) by mouth at bedtime. - amLODipine (NORVASC) 5 MG tablet; TAKE 1 TABLET BY MOUTH ONCE A DAY - allopurinol (ZYLOPRIM) 300 MG tablet; TAKE 1 TABLET BY MOUTH ONCE A DAY    Pt will schedule an appointment to establish with a new provider.  Return precautions provided, and patient handout on food choices for cholesterol, HTN.  Plan to follow up as needed, or for worsening or persistent symptoms despite treatment.  Patient Instructions  We will call with your lab results when available.  Continue all current medications as directed.  Schedule an appointment to establish with New PCP prior to leaving today.  If emergency symptoms discussed during visit developed, seek medical attention immediately.  Followup as needed, or for worsening or persistent symptoms despite treatment.

## 2013-10-10 NOTE — Patient Instructions (Signed)
We will call with your lab results when available.  Continue all current medications as directed.  Schedule an appointment to establish with New PCP prior to leaving today.  If emergency symptoms discussed during visit developed, seek medical attention immediately.  Followup as needed, or for worsening or persistent symptoms despite treatment.    Food Choices to Lower Your Triglycerides  Triglycerides are a type of fat in your blood. High levels of triglycerides can increase the risk of heart disease and stroke. If your triglyceride levels are high, the foods you eat and your eating habits are very important. Choosing the right foods can help lower your triglycerides.  WHAT GENERAL GUIDELINES DO I NEED TO FOLLOW?  Lose weight if you are overweight.   Limit or avoid alcohol.   Fill one half of your plate with vegetables and green salads.   Limit fruit to two servings a day. Choose fruit instead of juice.   Make one fourth of your plate whole grains. Look for the word "whole" as the first word in the ingredient list.  Fill one fourth of your plate with lean protein foods.  Enjoy fatty fish (such as salmon, mackerel, sardines, and tuna) three times a week.   Choose healthy fats.   Limit foods high in starch and sugar.  Eat more home-cooked food and less restaurant, buffet, and fast food.  Limit fried foods.  Cook foods using methods other than frying.  Limit saturated fats.  Check ingredient lists to avoid foods with partially hydrogenated oils (trans fats) in them. WHAT FOODS CAN I EAT?  Grains Whole grains, such as whole wheat or whole grain breads, crackers, cereals, and pasta. Unsweetened oatmeal, bulgur, barley, quinoa, or brown rice. Corn or whole wheat flour tortillas.  Vegetables Fresh or frozen vegetables (raw, steamed, roasted, or grilled). Green salads. Fruits All fresh, canned (in natural juice), or frozen fruits. Meat and Other Protein  Products Ground beef (85% or leaner), grass-fed beef, or beef trimmed of fat. Skinless chicken or [place]. Ground chicken or [place]. Pork trimmed of fat. All fish and seafood. Eggs. Dried beans, peas, or lentils. Unsalted nuts or seeds. Unsalted canned or dry beans. Dairy Low-fat dairy products, such as skim or 1% milk, 2% or reduced-fat cheeses, low-fat ricotta or cottage cheese, or plain low-fat yogurt. Fats and Oils Tub margarines without trans fats. Light or reduced-fat mayonnaise and salad dressings. Avocado. Safflower, olive, or canola oils. Natural peanut or almond butter. The items listed above may not be a complete list of recommended foods or beverages. Contact your dietitian for more options. WHAT FOODS ARE NOT RECOMMENDED?  Grains White bread. White pasta. White rice. Cornbread. Bagels, pastries, and croissants. Crackers that contain trans fat. Vegetables White potatoes. Corn. Creamed or fried vegetables. Vegetables in a cheese sauce. Fruits Dried fruits. Canned fruit in light or heavy syrup. Fruit juice. Meat and Other Protein Products Fatty cuts of meat. Ribs, chicken wings, bacon, sausage, bologna, salami, chitterlings, fatback, hot dogs, bratwurst, and packaged luncheon meats. Dairy Whole or 2% milk, cream, half-and-half, and cream cheese. Whole-fat or sweetened yogurt. Full-fat cheeses. Nondairy creamers and whipped toppings. Processed cheese, cheese spreads, or cheese curds. Sweets and Desserts Corn syrup, sugars, honey, and molasses. Candy. Jam and jelly. Syrup. Sweetened cereals. Cookies, pies, cakes, donuts, muffins, and ice cream. Fats and Oils Butter, stick margarine, lard, shortening, ghee, or bacon fat. Coconut, palm kernel, or palm oils. Beverages Alcohol. Sweetened drinks (such as sodas, lemonade, and fruit drinks or punches). The  items listed above may not be a complete list of foods and beverages to avoid. Contact your dietitian for more information. Document  Released: 12/25/2003 Document Revised: 03/13/2013 Document Reviewed: 01/10/2013 [facility] Patient Information 2015 [place], [place]. This information is not intended to replace advice given to you by your health care provider. Make sure you discuss any questions you have with your health care provider. Hypertension Hypertension is another name for high blood pressure. High blood pressure forces your heart to work harder to pump blood. A blood pressure reading has two numbers, which includes a higher number over a lower number (example: 110/72). HOME CARE   Have your blood pressure rechecked by your doctor.  Only take medicine as told by your doctor. Follow the directions carefully. The medicine does not work as well if you skip doses. Skipping doses also puts you at risk for problems.  Do not smoke.  Monitor your blood pressure at home as told by your doctor. GET HELP IF:  You think you are having a reaction to the medicine you are taking.  You have repeat headaches or feel dizzy.  You have puffiness (swelling) in your ankles.  You have trouble with your vision. GET HELP RIGHT AWAY IF:   You get a very bad headache and are confused.  You feel weak, numb, or faint.  You get chest or belly (abdominal) pain.  You throw up (vomit).  You cannot breathe very well. MAKE SURE YOU:   Understand these instructions.  Will watch your condition.  Will get help right away if you are not doing well or get worse. Document Released: 08/25/2007 Document Revised: 03/13/2013 Document Reviewed: 12/29/2012 [facility] Patient Information 2015 [place], [place]. This information is not intended to replace advice given to you by your health care provider. Make sure you discuss any questions you have with your health care provider.

## 2013-10-31 ENCOUNTER — Encounter: Payer: Self-pay | Admitting: Family Medicine

## 2013-10-31 ENCOUNTER — Ambulatory Visit: Payer: Medicare Other | Admitting: Family

## 2013-10-31 ENCOUNTER — Encounter: Payer: Self-pay | Admitting: *Deleted

## 2013-10-31 ENCOUNTER — Ambulatory Visit (INDEPENDENT_AMBULATORY_CARE_PROVIDER_SITE_OTHER): Payer: Medicare Other | Admitting: Family Medicine

## 2013-10-31 VITALS — BP 102/62 | HR 88 | Temp 97.8°F | Ht 65.5 in | Wt 148.0 lb

## 2013-10-31 DIAGNOSIS — R3129 Other microscopic hematuria: Secondary | ICD-10-CM

## 2013-10-31 DIAGNOSIS — Z8679 Personal history of other diseases of the circulatory system: Secondary | ICD-10-CM

## 2013-10-31 DIAGNOSIS — N400 Enlarged prostate without lower urinary tract symptoms: Secondary | ICD-10-CM | POA: Insufficient documentation

## 2013-10-31 DIAGNOSIS — I10 Essential (primary) hypertension: Secondary | ICD-10-CM

## 2013-10-31 DIAGNOSIS — I482 Chronic atrial fibrillation, unspecified: Secondary | ICD-10-CM

## 2013-10-31 DIAGNOSIS — J984 Other disorders of lung: Secondary | ICD-10-CM

## 2013-10-31 DIAGNOSIS — Z5181 Encounter for therapeutic drug level monitoring: Secondary | ICD-10-CM

## 2013-10-31 DIAGNOSIS — I4891 Unspecified atrial fibrillation: Secondary | ICD-10-CM

## 2013-10-31 DIAGNOSIS — R351 Nocturia: Secondary | ICD-10-CM

## 2013-10-31 DIAGNOSIS — N401 Enlarged prostate with lower urinary tract symptoms: Secondary | ICD-10-CM

## 2013-10-31 LAB — PSA: PSA: 3.04 ng/mL (ref 0.10–4.00)

## 2013-10-31 LAB — POCT URINALYSIS DIPSTICK
BILIRUBIN UA: NEGATIVE
GLUCOSE UA: NEGATIVE
Ketones, UA: NEGATIVE
LEUKOCYTES UA: NEGATIVE
NITRITE UA: NEGATIVE
Spec Grav, UA: 1.015
UROBILINOGEN UA: 1
pH, UA: 5.5

## 2013-10-31 LAB — POCT INR: INR: 3.2

## 2013-10-31 MED ORDER — TAMSULOSIN HCL 0.4 MG PO CAPS: 0.4000 mg | ORAL_CAPSULE | Freq: Every day | ORAL | Status: DC

## 2013-10-31 NOTE — Progress Notes (Signed)
Daniel ConchStephen Hunter, MD Phone: [phone]  Subjective:  Patient presents today to establish care. Chief complaint-noted.   Concern for BPH Nocturia 1-4x a night for several years and also depends on what he drinks. Weak stream. No known family history of prostate cancer. i-PSS score of 15 with some disturbance in QOL due to getting up at night.  ROS-denies frequency in the the day.  History of pulmonary nodule Stable 10 months later after initial exam in 2010. Recommended followup was 2 years. Discussed with patient and given his limited past smoking history, he does not want to pursue repeat CT scan. He states he is also worried about cost. He will let me know if he changes his mind. ROS-no chest pain shortness of breath or palpitations. Pain in left upper neck and back. Waxes and wanes.   Atrial fibrillation Patient is rate controlled without any medication. He continues to have warfarin and INR measured our clinic. Ros-denies palpitations or chest pain   The following were reviewed and entered/updated in epic: Past Medical History  Diagnosis Date  . Gout   . Hyperlipidemia   . Hypertension   . Colon polyps   . Renal insufficiency   . Anemia   . ACUT DUOD ULCER W/HEMORR W/O MENTION OBSTRUCTION 11/24/2007    Qualifier: Diagnosis of  By: Alwyn RenHopper MD, [name]    . Stroke     no deficits   Patient Active Problem List   Diagnosis Date Noted  . Atrial fibrillation 11/04/2006    Priority: High  . CEREBROVASCULAR ACCIDENT, HX OF 08/24/2006    Priority: High  . BPH (benign prostatic hyperplasia) 10/31/2013    Priority: Medium  . HYPERLIPIDEMIA 08/24/2006    Priority: Medium  . GOUT 08/24/2006    Priority: Medium  . HYPERTENSION 08/24/2006    Priority: Medium  . CKD (chronic kidney disease), stage III 08/24/2006    Priority: Medium  . PULMONARY NODULE, RIGHT UPPER LOBE 11/24/2007    Priority: Low  . Encounter for therapeutic drug monitoring 07/05/2013   Past Surgical History    Procedure Laterality Date  . Cataract extraction      right eye    No family history on file.  Medications- reviewed and updated Current Outpatient Prescriptions  Medication Sig Dispense Refill  . allopurinol (ZYLOPRIM) 300 MG tablet TAKE 1 TABLET BY MOUTH ONCE A DAY  90 tablet  1  . amLODipine (NORVASC) 5 MG tablet TAKE 1 TABLET BY MOUTH ONCE A DAY  90 tablet  1  . brimonidine (ALPHAGAN) 0.2 % ophthalmic solution 2 (two) times daily.      . simvastatin (ZOCOR) 40 MG tablet Take 1 tablet (40 mg total) by mouth at bedtime.  90 tablet  1  . warfarin (COUMADIN) 2.5 MG tablet Take as directed by anticoagulation clinic  90 tablet  1  . tamsulosin (FLOMAX) 0.4 MG CAPS capsule Take 1 capsule (0.4 mg total) by mouth daily after breakfast.  30 capsule  3   No current facility-administered medications for this visit.    Allergies-reviewed and updated No Known Allergies  History   Social History  . Marital Status: Married    Spouse Name: N/A    Number of Children: N/A  . Years of Education: N/A   Social History Main Topics  . Smoking status: Former Games developermoker  . Smokeless tobacco: None  . Alcohol Use: No  . Drug Use: None  . Sexual Activity: None   Other Topics Concern  . None  Social History Narrative   Patient's Daniel [name] faith is very important to him.   [employer] [place] for at least last 10 years. Raised in [place] and lived in [place] for long period time.   Volunteers at [employer] long    ROS--See HPI   Objective: BP 102/62  Pulse 88  Temp(Src) 97.8 F (36.6 C)  Ht 5' 5.5" (1.664 m)  Wt 148 lb (67.132 kg)  BMI 24.25 kg/m2 Gen: NAD, resting comfortably in chair, moves well to table. Talkative and pleasant. HEENT: Mucous membranes are moist. Oropharynx normal Neck: no thyromegaly CV: RRR (do not detect a fib at this time)no murmurs rubs or gallops Lungs: CTAB no crackles, wheeze, rhonchi Abdomen: soft/nontender/nondistended/normal bowel sounds. No rebound or  guarding.  Ext: no edema Skin: warm, dry Neuro: grossly normal, moves all extremities, PERRLA  Results for orders placed in visit on 10/31/13 (from the past 24 hour(s))  PSA     Status: None   Collection Time    10/31/13 11:23 AM      Result Value Ref Range   PSA 3.04  0.10 - 4.00 ng/mL  POCT INR     Status: None   Collection Time    10/31/13 11:27 AM      Result Value Ref Range   INR 3.2    POCT URINALYSIS DIPSTICK     Status: None   Collection Time    10/31/13 12:25 PM      Result Value Ref Range   Color, UA yellow     Clarity, UA clear     Glucose, UA n     Bilirubin, UA n     Ketones, UA n     Spec Grav, UA 1.015     Blood, UA 2+     pH, UA 5.5     Protein, UA 3+     Urobilinogen, UA 1.0     Nitrite, UA n     Leukocytes, UA Negative       Assessment/Plan:  BPH (benign prostatic hyperplasia) Given age PSA is at a reasonable level. Prostate is enlarged on exam. Suspect BPH as cause of nocturia and weak stream. ipss score 15. Will start Flomax at this time and ask patient to monitor blood pressure and take only half dose of his amlodipine. Patient to let us know if any orthostatic symptoms. Followup in 2 weeks. There is concern for hematuria off the urinalysis and will send for microscopic. If blood is found would still consider urology consult.  PULMONARY NODULE, RIGHT UPPER LOBE Discussed risks of not having repeat CT scan including death. Patient is not worried about nodule but is worried about cost and declines at this time to have imaging  Atrial fibrillation Rate controlled. On warfarin in our clinic.   HYPERTENSION Half amlodipine at 2.5 mg due to starting Flomax.   Meds ordered this encounter  Medications  . tamsulosin (FLOMAX) 0.4 MG CAPS capsule    Sig: Take 1 capsule (0.4 mg total) by mouth daily after breakfast.    Dispense:  30 capsule    Refill:  3

## 2013-10-31 NOTE — Assessment & Plan Note (Signed)
Rate controlled. On warfarin in our clinic.

## 2013-10-31 NOTE — Patient Instructions (Addendum)
Go to the lab today. We will check your urine and blood. If there is no blood in your urine and PSA ok, I will start you on a medication like terazosin. If there is blood in your urine or PSA level is high, we will send you to urology.   Wonderful to meet you.  Let's check in about 6 months from now Get your flu shot when flu season hits.

## 2013-10-31 NOTE — Assessment & Plan Note (Signed)
Discussed risks of not having repeat CT scan including death. Patient is not worried about nodule but is worried about cost and declines at this time to have imaging

## 2013-10-31 NOTE — Assessment & Plan Note (Signed)
Half amlodipine at 2.5 mg due to starting Flomax.

## 2013-10-31 NOTE — Patient Instructions (Signed)
Skip coumadin today. Then continue to take 1 tablet daily except 1/2 tablet on Wed.  Re-check in 4 weeks.  Anticoagulation Dose Instructions as of 10/31/2013     Glynis SmilesSun Mon Tue Wed Thu Fri Sat   New Dose 2.5 mg 2.5 mg 2.5 mg 1.25 mg 2.5 mg 2.5 mg 2.5 mg    Description       Skip coumadin today. Then continue to take 1 tablet daily except 1/2 tablet on Wed.  Re-check in 4 weeks.

## 2013-10-31 NOTE — Assessment & Plan Note (Addendum)
Given age PSA is at a reasonable level. Prostate is enlarged on exam. Suspect BPH as cause of nocturia and weak stream. ipss score 15. Will start Flomax at this time and ask patient to monitor blood pressure and take only half dose of his amlodipine. Patient to let us know if any orthostatic symptoms. Followup in 2 weeks. There is concern for hematuria off the urinalysis and will send for microscopic. If blood is found would still consider urology consult.

## 2013-11-01 ENCOUNTER — Ambulatory Visit: Payer: Medicare Other

## 2013-11-01 LAB — URINALYSIS, MICROSCOPIC ONLY

## 2013-11-01 NOTE — Addendum Note (Signed)
Addended by: Bonnye FavaKWEI, Kaycen Whitworth K on: 11/01/2013 10:19 AM   Modules accepted: Orders

## 2013-11-01 NOTE — Progress Notes (Signed)
Called and LM on pt VM with lab results

## 2013-11-05 ENCOUNTER — Telehealth: Payer: Self-pay | Admitting: Family Medicine

## 2013-11-05 NOTE — Telephone Encounter (Signed)
Pt states he is concerned after reading the potential side affects of Flomax, he wants to know if he can stop taking it or does he have to consult with you before stopping it. He is not having side affects as of now he is just worried.

## 2013-11-05 NOTE — Telephone Encounter (Signed)
He may stop. Medication purely for symptom control. If he doesn't mind his nocturia and weak stream, he does not have to take it.

## 2013-11-05 NOTE — Telephone Encounter (Signed)
Pt would like Dr Durene CalHunter to call him about this tamsulosin (FLOMAX) 0.4 MG CAPS capsule.. There were side effects that he is not comfortable with. Pt said he has not taken the medicine   Phone number; [phone]  After 12:30 pm

## 2013-11-06 NOTE — Telephone Encounter (Signed)
Pt.notified

## 2013-11-29 ENCOUNTER — Ambulatory Visit: Payer: Medicare Other | Admitting: Family

## 2013-11-29 ENCOUNTER — Other Ambulatory Visit: Payer: Self-pay | Admitting: Internal Medicine

## 2013-11-29 ENCOUNTER — Ambulatory Visit (INDEPENDENT_AMBULATORY_CARE_PROVIDER_SITE_OTHER): Payer: Medicare Other | Admitting: Family

## 2013-11-29 DIAGNOSIS — Z5181 Encounter for therapeutic drug level monitoring: Secondary | ICD-10-CM

## 2013-11-29 LAB — POCT INR: INR: 2.2

## 2013-11-29 NOTE — Patient Instructions (Signed)
Continue to take 1 tablet daily except 1/2 tablet on Wed.  Re-check in 6 weeks.  Anticoagulation Dose Instructions as of 11/29/2013     Glynis Smiles Tue Wed Thu Fri Sat   New Dose 2.5 mg 2.5 mg 2.5 mg 1.25 mg 2.5 mg 2.5 mg 2.5 mg    Description       Continue to take 1 tablet daily except 1/2 tablet on Wed.  Re-check in 6 weeks.

## 2014-01-10 ENCOUNTER — Ambulatory Visit: Payer: Medicare Other

## 2014-01-10 ENCOUNTER — Ambulatory Visit: Payer: Medicare Other | Admitting: Family

## 2014-01-10 ENCOUNTER — Ambulatory Visit (INDEPENDENT_AMBULATORY_CARE_PROVIDER_SITE_OTHER): Payer: Medicare Other | Admitting: Family

## 2014-01-10 DIAGNOSIS — Z5181 Encounter for therapeutic drug level monitoring: Secondary | ICD-10-CM

## 2014-01-10 LAB — POCT INR: INR: 2.5

## 2014-01-10 NOTE — Patient Instructions (Signed)
Continue to take 1 tablet daily except 1/2 tablet on Wed.  Re-check in 6 weeks  Anticoagulation Dose Instructions as of 01/10/2014     Glynis SmilesSun Mon Tue Wed Thu Fri Sat   New Dose 2.5 mg 2.5 mg 2.5 mg 1.25 mg 2.5 mg 2.5 mg 2.5 mg    Description       Continue to take 1 tablet daily except 1/2 tablet on Wed.  Re-check in 6 weeks.

## 2014-01-30 ENCOUNTER — Encounter: Payer: Self-pay | Admitting: Internal Medicine

## 2014-01-30 ENCOUNTER — Ambulatory Visit (INDEPENDENT_AMBULATORY_CARE_PROVIDER_SITE_OTHER): Payer: Medicare Other | Admitting: Internal Medicine

## 2014-01-30 VITALS — BP 116/70 | HR 79 | Resp 20 | Ht 65.5 in | Wt 152.0 lb

## 2014-01-30 DIAGNOSIS — R040 Epistaxis: Secondary | ICD-10-CM

## 2014-01-30 DIAGNOSIS — Z5181 Encounter for therapeutic drug level monitoring: Secondary | ICD-10-CM

## 2014-01-30 DIAGNOSIS — I480 Paroxysmal atrial fibrillation: Secondary | ICD-10-CM

## 2014-01-30 LAB — CBC WITH DIFFERENTIAL/PLATELET
BASOS ABS: 0.1 10*3/uL (ref 0.0–0.1)
Basophils Relative: 0.4 % (ref 0.0–3.0)
EOS ABS: 0.2 10*3/uL (ref 0.0–0.7)
Eosinophils Relative: 1.7 % (ref 0.0–5.0)
HCT: 37.3 % — ABNORMAL LOW (ref 39.0–52.0)
Hemoglobin: 12.2 g/dL — ABNORMAL LOW (ref 13.0–17.0)
LYMPHS ABS: 3 10*3/uL (ref 0.7–4.0)
Lymphocytes Relative: 21.9 % (ref 12.0–46.0)
MCHC: 32.8 g/dL (ref 30.0–36.0)
MCV: 93.8 fl (ref 78.0–100.0)
Monocytes Absolute: 1.1 10*3/uL — ABNORMAL HIGH (ref 0.1–1.0)
Monocytes Relative: 7.9 % (ref 3.0–12.0)
NEUTROS PCT: 68.1 % (ref 43.0–77.0)
Neutro Abs: 9.4 10*3/uL — ABNORMAL HIGH (ref 1.4–7.7)
PLATELETS: 264 10*3/uL (ref 150.0–400.0)
RBC: 3.97 Mil/uL — ABNORMAL LOW (ref 4.22–5.81)
RDW: 16.1 % — AB (ref 11.5–15.5)
WBC: 13.9 10*3/uL — ABNORMAL HIGH (ref 4.0–10.5)

## 2014-01-30 NOTE — Progress Notes (Signed)
Subjective:    Patient ID: Daniel [name], male    DOB: January 12, 1933, 80 y.o.   MRN: [id number]  HPI 80 year old patient who has a history of paroxysmal atrial fibrillation and has been on chronic Coumadin anticoagulation.  His INRs have been easily maintained in a therapeutic range. He is here today with a chronic history of epistaxis, which is almost always left sided.  This has worsened over the past 6 months and he states that he has had daily epistaxis over the last 30 days.  Past Medical History  Diagnosis Date  . Gout   . Hyperlipidemia   . Hypertension   . Colon polyps   . Renal insufficiency   . Anemia   . ACUT DUOD ULCER W/HEMORR W/O MENTION OBSTRUCTION 11/24/2007    Qualifier: Diagnosis of  By: Alwyn RenHopper MD, [name]    . Stroke     no deficits    History   Social History  . Marital Status: Married    Spouse Name: N/A    Number of Children: N/A  . Years of Education: N/A   Occupational History  . Not on file.   Social History Main Topics  . Smoking status: Former Games developermoker  . Smokeless tobacco: Not on file  . Alcohol Use: No  . Drug Use: Not on file  . Sexual Activity: Not on file   Other Topics Concern  . Not on file   Social History Narrative   Patient's Daniel KnucklesChristian faith is very important to him.   [place] [place] [facility] [place] [facility]. [place].   Volunteers at [employer]    Past Surgical History  Procedure Laterality Date  . Cataract extraction      right eye    No family history on file.  No Known Allergies  Current Outpatient Prescriptions on File Prior to Visit  Medication Sig Dispense Refill  . allopurinol (ZYLOPRIM) 300 MG tablet TAKE 1 TABLET BY MOUTH ONCE A DAY 90 tablet 1  . amLODipine (NORVASC) 5 MG tablet 2.5 mg. TAKE 1 TABLET BY MOUTH ONCE A DAY    . brimonidine (ALPHAGAN) 0.2 % ophthalmic solution 2 (two) times daily.    . simvastatin (ZOCOR) 40 MG tablet Take 1 tablet (40 mg  total) by mouth at bedtime. 90 tablet 1  . tamsulosin (FLOMAX) 0.4 MG CAPS capsule Take 1 capsule (0.4 mg total) by mouth daily after breakfast. 30 capsule 3  . warfarin (COUMADIN) 2.5 MG tablet TAKE AS DIRECTED BY ANTICOAGULATION CLINIC 90 tablet 0   No current facility-administered medications on file prior to visit.    BP 116/70 mmHg  Pulse 79  Resp 20  Ht 5' 5.5" (1.664 m)  Wt 152 lb (68.947 kg)  BMI 24.90 kg/m2  SpO2 97%      Review of Systems  Constitutional: Negative for fever, chills, appetite change and fatigue.  HENT: Positive for nosebleeds. Negative for congestion, dental problem, ear pain, hearing loss, sore throat, tinnitus, trouble swallowing and voice change.   Eyes: Negative for pain, discharge and visual disturbance.  Respiratory: Negative for cough, chest tightness, wheezing and stridor.   Cardiovascular: Negative for chest pain, palpitations and leg swelling.  Gastrointestinal: Negative for nausea, vomiting, abdominal pain, diarrhea, constipation, blood in stool and abdominal distention.  Genitourinary: Negative for urgency, hematuria, flank pain, discharge, difficulty urinating and genital sores.  Musculoskeletal: Negative for myalgias, back pain, joint swelling, arthralgias, gait problem  and neck stiffness.  Skin: Negative for rash.  Neurological: Negative for dizziness, syncope, speech difficulty, weakness, numbness and headaches.  Hematological: Negative for adenopathy. Does not bruise/bleed easily.  Psychiatric/Behavioral: Negative for behavioral problems and dysphoric mood. The patient is not nervous/anxious.        Objective:   Physical Exam  Constitutional: He is oriented to person, place, and time. He appears well-developed.  HENT:  Head: Normocephalic.  Right Ear: External ear normal.  Left Ear: External ear normal.  Dried blood involving the left septal  area  Eyes: Conjunctivae and EOM are normal.  Neck: Normal range of motion.    Cardiovascular: Normal rate and normal heart sounds.   Pulmonary/Chest: Breath sounds normal.  Abdominal: Bowel sounds are normal.  Musculoskeletal: Normal range of motion. He exhibits no edema or tenderness.  Neurological: He is alert and oriented to person, place, and time.  Psychiatric: He has a normal mood and affect. His behavior is normal.          Assessment & Plan:   Chronic left-sided epistaxis.  Will check a CBC.  Schedule ENT evaluation Paroxysmal atrial fibrillation Chronic Coumadin anticoagulation

## 2014-01-30 NOTE — Progress Notes (Signed)
Pre visit review using our clinic review tool, if applicable. No additional management support is needed unless otherwise documented below in the visit note. 

## 2014-01-30 NOTE — Patient Instructions (Signed)
ENT follow-up as discussed  Call or return to clinic prn if these symptoms worsen or fail to improve as anticipated.

## 2014-02-04 ENCOUNTER — Emergency Department (HOSPITAL_COMMUNITY)
Admission: EM | Admit: 2014-02-04 | Discharge: 2014-02-04 | Disposition: A | Discharge status: Home / Self Care | Payer: Medicare Other | Attending: Emergency Medicine | Admitting: Emergency Medicine

## 2014-02-04 ENCOUNTER — Encounter (HOSPITAL_COMMUNITY): Payer: Self-pay | Admitting: Emergency Medicine

## 2014-02-04 DIAGNOSIS — Z7901 Long term (current) use of anticoagulants: Secondary | ICD-10-CM | POA: Insufficient documentation

## 2014-02-04 DIAGNOSIS — I10 Essential (primary) hypertension: Secondary | ICD-10-CM | POA: Insufficient documentation

## 2014-02-04 DIAGNOSIS — I4891 Unspecified atrial fibrillation: Secondary | ICD-10-CM | POA: Insufficient documentation

## 2014-02-04 DIAGNOSIS — Z87448 Personal history of other diseases of urinary system: Secondary | ICD-10-CM | POA: Diagnosis not present

## 2014-02-04 DIAGNOSIS — Z87891 Personal history of nicotine dependence: Secondary | ICD-10-CM | POA: Diagnosis not present

## 2014-02-04 DIAGNOSIS — M109 Gout, unspecified: Secondary | ICD-10-CM | POA: Diagnosis not present

## 2014-02-04 DIAGNOSIS — Z79899 Other long term (current) drug therapy: Secondary | ICD-10-CM | POA: Insufficient documentation

## 2014-02-04 DIAGNOSIS — E785 Hyperlipidemia, unspecified: Secondary | ICD-10-CM | POA: Insufficient documentation

## 2014-02-04 DIAGNOSIS — Z8673 Personal history of transient ischemic attack (TIA), and cerebral infarction without residual deficits: Secondary | ICD-10-CM | POA: Diagnosis not present

## 2014-02-04 DIAGNOSIS — R040 Epistaxis: Secondary | ICD-10-CM | POA: Insufficient documentation

## 2014-02-04 DIAGNOSIS — Z86010 Personal history of colonic polyps: Secondary | ICD-10-CM | POA: Diagnosis not present

## 2014-02-04 DIAGNOSIS — Z8719 Personal history of other diseases of the digestive system: Secondary | ICD-10-CM | POA: Diagnosis not present

## 2014-02-04 DIAGNOSIS — Z862 Personal history of diseases of the blood and blood-forming organs and certain disorders involving the immune mechanism: Secondary | ICD-10-CM | POA: Insufficient documentation

## 2014-02-04 LAB — CBC
HCT: 33.7 % — ABNORMAL LOW (ref 39.0–52.0)
Hemoglobin: 11.5 g/dL — ABNORMAL LOW (ref 13.0–17.0)
MCH: 32.1 pg (ref 26.0–34.0)
MCHC: 34.1 g/dL (ref 30.0–36.0)
MCV: 94.1 fL (ref 78.0–100.0)
Platelets: 304 10*3/uL (ref 150–400)
RBC: 3.58 MIL/uL — ABNORMAL LOW (ref 4.22–5.81)
RDW: 15 % (ref 11.5–15.5)
WBC: 16.3 10*3/uL — ABNORMAL HIGH (ref 4.0–10.5)

## 2014-02-04 LAB — PROTIME-INR
INR: 2.8 — AB (ref 0.00–1.49)
Prothrombin Time: 29.7 seconds — ABNORMAL HIGH (ref 11.6–15.2)

## 2014-02-04 MED ORDER — CEPHALEXIN 500 MG PO CAPS: 500.0000 mg | ORAL_CAPSULE | Freq: Three times a day (TID) | ORAL | Status: DC

## 2014-02-04 MED ORDER — PHENYLEPHRINE HCL 0.5 % NA SOLN
1.0000 [drp] | Freq: Once | NASAL | Status: DC
Start: 2014-02-04 — End: 2014-02-04
  Filled 2014-02-04: qty 15

## 2014-02-04 MED ORDER — OXYMETAZOLINE HCL 0.05 % NA SOLN
1.0000 | Freq: Once | NASAL | Status: AC
  Administered 2014-02-04: 1 via NASAL

## 2014-02-04 NOTE — Discharge Instructions (Signed)
Stop taking coumadin tonight. Restart same dose tomorrow.   If you have nose bleed, try keep pressure on your nose. If it doesn't stop, then come to ED.   Take keflex as it will help prevent infection since you have the balloon in your nose.   Please call Dr. Emeline DarlingGore from ENT and make an appointment with him to take out the balloon.   Recheck INR in a week.   Return to ER if you have worse bleeding, severe pain, vomiting.

## 2014-02-04 NOTE — ED Notes (Addendum)
Pt had recent cauterization of nose last week, pt has been picking at it and has been blowing it. Pt presents with nose bleed with tissue up nose. Pt has had this episode x 2 hours. Pt states he feels slightly weak.

## 2014-02-04 NOTE — ED Notes (Addendum)
No active nosebleed at present time. Pt continues to pick and blow left nostril. Pt encouraged to avoid irritation to site.

## 2014-02-04 NOTE — ED Notes (Signed)
Bed: WA10 Expected date:  Expected time:  Means of arrival:  Comments: Tri 1 

## 2014-02-04 NOTE — ED Provider Notes (Signed)
CSN: [id number]     Arrival date & time 02/04/14  1101 History   First MD Initiated Contact with Patient 02/04/14 1321     Chief Complaint  Patient presents with  . Epistaxis     (Consider location/radiation/quality/duration/timing/severity/associated sxs/prior Treatment) The history is provided by the patient.  Daniel MostHenry P Lovins is a 80 y.o. male hx of gout, HL, HTN, afib on coumadin, here with nose bleed. Had a nosebleed yesterday and was cauterized. Today he was volunteering and was bleeding for several hours from the left nostril. Try to put some pressure with no relief. Has been swallowing blood but denies coughing or vomiting blood. He is on Coumadin for A. Fib.    Past Medical History  Diagnosis Date  . Gout   . Hyperlipidemia   . Hypertension   . Colon polyps   . Renal insufficiency   . Anemia   . ACUT DUOD ULCER W/HEMORR W/O MENTION OBSTRUCTION 11/24/2007    Qualifier: Diagnosis of  By: Alwyn RenHopper MD, [name]    . Stroke     no deficits   Past Surgical History  Procedure Laterality Date  . Cataract extraction      right eye   No family history on file. History  Substance Use Topics  . Smoking status: Former Games developermoker  . Smokeless tobacco: Not on file  . Alcohol Use: No    Review of Systems  HENT: Positive for nosebleeds.   All other systems reviewed and are negative.     Allergies  Review of patient's allergies indicates no known allergies.  Home Medications   Prior to Admission medications   Medication Sig Start Date End Date Taking? Authorizing Provider  allopurinol (ZYLOPRIM) 300 MG tablet TAKE 1 TABLET BY MOUTH ONCE A DAY 10/10/13  Yes Toniann KetMatthew K Tucker, PA-C  amLODipine (NORVASC) 5 MG tablet Take 5 mg by mouth daily.  10/10/13  Yes Toniann KetMatthew K Tucker, PA-C  brimonidine (ALPHAGAN) 0.2 % ophthalmic solution 2 (two) times daily. 09/07/13  Yes Historical Provider, MD  simvastatin (ZOCOR) 40 MG tablet Take 1 tablet (40 mg total) by mouth at bedtime. 10/10/13  Yes  Toniann KetMatthew K Tucker, PA-C  tamsulosin (FLOMAX) 0.4 MG CAPS capsule Take 1 capsule (0.4 mg total) by mouth daily after breakfast. 10/31/13  Yes Shelva MajesticStephen O Hunter, MD  warfarin (COUMADIN) 2.5 MG tablet TAKE AS DIRECTED BY ANTICOAGULATION CLINIC Patient taking differently: take 1 tablet daily, except on wednesays take half a tablet =1.25mg  11/30/13  Yes Shelva MajesticStephen O Hunter, MD  cephALEXin (KEFLEX) 500 MG capsule Take 1 capsule (500 mg total) by mouth 3 (three) times daily. 02/04/14   Richardean Canalavid H Shaunae Sieloff, MD   BP 131/65 mmHg  Pulse 102  Temp(Src) 97.5 F (36.4 C) (Oral)  Resp 15  SpO2 94% Physical Exam  Constitutional: He is oriented to person, place, and time.  Uncomfortable   HENT:  Head: Normocephalic.  Mouth/Throat: Oropharynx is clear and moist.  L nostril with dry blood at Kisselbach's triangle. No obvious blood in posterior pharynx.   Eyes: Conjunctivae are normal. Pupils are equal, round, and reactive to light.  Neck: Normal range of motion. Neck supple.  Cardiovascular: Normal rate, regular rhythm and normal heart sounds.   Pulmonary/Chest: Effort normal.  Abdominal: Soft.  Musculoskeletal: Normal range of motion.  Neurological: He is alert and oriented to person, place, and time.  Skin: Skin is warm.  Psychiatric: He has a normal mood and affect. His behavior is normal. Judgment and thought content normal.  Nursing note and vitals reviewed.   ED Course  EPISTAXIS MANAGEMENT Date/Time: 02/04/2014 3:18 PM Performed by: Richardean CanalYAO, Janeece Blok H Authorized by: Richardean CanalYAO, Abagale Boulos H Consent: Verbal consent obtained. Risks and benefits: risks, benefits and alternatives were discussed Consent given by: patient Patient understanding: patient states understanding of the procedure being performed Patient consent: the patient's understanding of the procedure matches consent given Procedure consent: procedure consent matches procedure scheduled Relevant documents: relevant documents present and verified Test results:  test results available and properly labeled Patient identity confirmed: verbally with patient and arm band Patient sedated: no Treatment site: left anterior Repair method: silver nitrate and nasal balloon Post-procedure assessment: bleeding stopped Treatment complexity: simple Patient tolerance: Patient tolerated the procedure well with no immediate complications   (including critical care time) Labs Review Labs Reviewed  CBC - Abnormal; Notable for the following:    WBC 16.3 (*)    RBC 3.58 (*)    Hemoglobin 11.5 (*)    HCT 33.7 (*)    All other components within normal limits  PROTIME-INR - Abnormal; Notable for the following:    Prothrombin Time 29.7 (*)    INR 2.80 (*)    All other components within normal limits    Imaging Review No results found.   EKG Interpretation None      MDM   Final diagnoses:  Epistaxis    Daniel [name] is a 80 y.o. male here with nose bleed. Hg stable. INR 2.8, usually 2.5. He is picking his nose in the room. I tried cauterize l nose but then he picked at it then started bleeding again. I then tried to place 3.5 mm rhinorocket and bleeding stopped. Will place on keflex empirically given WBC 16 and patient is older. Will have him stop coumadin tonight and restart tomorrow. Will refer to ENT.      Richardean Canalavid H Warren Lindahl, MD 02/04/14 [phone]

## 2014-02-08 ENCOUNTER — Encounter: Payer: Self-pay | Admitting: Internal Medicine

## 2014-02-08 ENCOUNTER — Ambulatory Visit (INDEPENDENT_AMBULATORY_CARE_PROVIDER_SITE_OTHER): Payer: Medicare Other | Admitting: Internal Medicine

## 2014-02-08 VITALS — BP 160/80 | HR 92 | Temp 97.8°F | Resp 20 | Ht 65.5 in | Wt 151.0 lb

## 2014-02-08 DIAGNOSIS — R040 Epistaxis: Secondary | ICD-10-CM

## 2014-02-08 DIAGNOSIS — I480 Paroxysmal atrial fibrillation: Secondary | ICD-10-CM

## 2014-02-08 DIAGNOSIS — Z5181 Encounter for therapeutic drug level monitoring: Secondary | ICD-10-CM

## 2014-02-08 NOTE — Progress Notes (Signed)
Subjective:    Patient ID: Daniel [name], male    DOB: 1933/03/09, 80 y.o.   MRN: [id number]  HPI  80 year old patient who has been on chronic Coumadin anticoagulation for chronic atrial fibrillation and stroke prevention.  For several months.  He has had recurrent epistaxis.  This has worsened recently and was evaluated in the ED 4 days ago.  He was seen by ENT today and had further cautery.  Coumadin has been held since the past 4 days.  INRs have always been quite therapeutic.  He has developed some modest chronic blood loss anemia.  Generally feels well today  Past Medical History  Diagnosis Date  . Gout   . Hyperlipidemia   . Hypertension   . Colon polyps   . Renal insufficiency   . Anemia   . ACUT DUOD ULCER W/HEMORR W/O MENTION OBSTRUCTION 11/24/2007    Qualifier: Diagnosis of  By: Alwyn RenHopper MD, [name]    . Stroke     no deficits    History   Social History  . Marital Status: Married    Spouse Name: N/A    Number of Children: N/A  . Years of Education: N/A   Occupational History  . Not on file.   Social History Main Topics  . Smoking status: Former Games developermoker  . Smokeless tobacco: Not on file  . Alcohol Use: No  . Drug Use: Not on file  . Sexual Activity: Not on file   Other Topics Concern  . Not on file   Social History Narrative   Patient's Daniel KnucklesChristian faith is very important to him.   [employer] [employer] [place] [facility]. [place] period time.   Volunteers [facility] [employer]    Past Surgical History  Procedure Laterality Date  . Cataract extraction      right eye    No family history on file.  No Known Allergies  Current Outpatient Prescriptions on File Prior to Visit  Medication Sig Dispense Refill  . allopurinol (ZYLOPRIM) 300 MG tablet TAKE 1 TABLET BY MOUTH ONCE A DAY 90 tablet 1  . amLODipine (NORVASC) 5 MG tablet Take 5 mg by mouth daily.     . brimonidine (ALPHAGAN) 0.2 % ophthalmic solution 2 (two)  times daily.    . simvastatin (ZOCOR) 40 MG tablet Take 1 tablet (40 mg total) by mouth at bedtime. 90 tablet 1  . tamsulosin (FLOMAX) 0.4 MG CAPS capsule Take 1 capsule (0.4 mg total) by mouth daily after breakfast. 30 capsule 3  . warfarin (COUMADIN) 2.5 MG tablet TAKE AS DIRECTED BY ANTICOAGULATION CLINIC (Patient taking differently: take 1 tablet daily, except on wednesays take half a tablet =1.25mg ) 90 tablet 0   No current facility-administered medications on file prior to visit.    BP 160/80 mmHg  Pulse 92  Temp(Src) 97.8 F (36.6 C) (Oral)  Resp 20  Ht 5' 5.5" (1.664 m)  Wt 151 lb (68.493 kg)  BMI 24.74 kg/m2  SpO2 98%     Review of Systems  Constitutional: Negative for fever, chills, appetite change and fatigue.  HENT: Positive for nosebleeds. Negative for congestion, dental problem, ear pain, hearing loss, sore throat, tinnitus, trouble swallowing and voice change.   Eyes: Negative for pain, discharge and visual disturbance.  Respiratory: Negative for cough, chest tightness, wheezing and stridor.   Cardiovascular: Negative for chest pain, palpitations and leg swelling.  Gastrointestinal: Negative for nausea, vomiting, abdominal  pain, diarrhea, constipation, blood in stool and abdominal distention.  Genitourinary: Negative for urgency, hematuria, flank pain, discharge, difficulty urinating and genital sores.  Musculoskeletal: Negative for myalgias, back pain, joint swelling, arthralgias, gait problem and neck stiffness.  Skin: Negative for rash.  Neurological: Negative for dizziness, syncope, speech difficulty, weakness, numbness and headaches.  Hematological: Negative for adenopathy. Does not bruise/bleed easily.  Psychiatric/Behavioral: Negative for behavioral problems and dysphoric mood. The patient is not nervous/anxious.        Objective:   Physical Exam  Constitutional: He appears well-developed and well-nourished. No distress.  HENT:  No epistaxis Skin  discoloration from cautery          Assessment & Plan:   Chronic epistaxis.  Status post aggressive treatment with cautery earlier today by ENT Chronic atrial fibrillation Chronic Coumadin anticoagulation.  Will hold the Coumadin over the weekend and resume next week.  Will check INR in 4 weeks

## 2014-02-08 NOTE — Patient Instructions (Signed)
Hold Coumadin for one week, then resume at prior dose Check INR in 4-5 weeks  Routine follow-up in 3 months  Call or return to clinic prn if these symptoms worsen or fail to improve as anticipated.

## 2014-02-08 NOTE — Progress Notes (Signed)
Pre visit review using our clinic review tool, if applicable. No additional management support is needed unless otherwise documented below in the visit note. 

## 2014-02-21 ENCOUNTER — Ambulatory Visit: Payer: Medicare Other | Admitting: Family

## 2014-03-13 ENCOUNTER — Ambulatory Visit: Payer: Medicare Other | Admitting: Family

## 2014-03-14 ENCOUNTER — Other Ambulatory Visit: Payer: Self-pay | Admitting: *Deleted

## 2014-03-14 DIAGNOSIS — N400 Enlarged prostate without lower urinary tract symptoms: Secondary | ICD-10-CM

## 2014-03-14 MED ORDER — TAMSULOSIN HCL 0.4 MG PO CAPS: 0.4000 mg | ORAL_CAPSULE | Freq: Every day | ORAL | Status: DC

## 2014-03-18 ENCOUNTER — Ambulatory Visit: Payer: Medicare Other | Admitting: Family

## 2014-03-21 ENCOUNTER — Telehealth: Payer: Self-pay | Admitting: Family Medicine

## 2014-03-21 NOTE — Telephone Encounter (Signed)
Immunization list updated

## 2014-03-21 NOTE — Telephone Encounter (Signed)
Pt [facility] flu shot [facility] . Pt is volunteer

## 2014-03-25 ENCOUNTER — Telehealth: Payer: Self-pay | Admitting: Family

## 2014-03-25 NOTE — Telephone Encounter (Signed)
Please call and schedule PT/INR asap. Thanks!

## 2014-03-25 NOTE — Telephone Encounter (Signed)
Pt has been sche 04/01/14.  Is that OK?

## 2014-03-26 ENCOUNTER — Telehealth: Payer: Self-pay

## 2014-03-26 MED ORDER — WARFARIN SODIUM 2.5 MG PO TABS: ORAL_TABLET | ORAL | Status: DC

## 2014-03-26 NOTE — Telephone Encounter (Signed)
Rx request for Warfarin Sodium 2.5 mg tablet- Take as directed by anticoagulation clinic.   [facility]  Pls advise.

## 2014-03-26 NOTE — Telephone Encounter (Signed)
Done

## 2014-03-28 ENCOUNTER — Emergency Department (HOSPITAL_COMMUNITY): Payer: Medicare Other

## 2014-03-28 ENCOUNTER — Observation Stay (HOSPITAL_COMMUNITY)
Admission: EM | Admit: 2014-03-28 | Discharge: 2014-03-29 | Disposition: A | Discharge status: Home / Self Care | Payer: Medicare Other | Attending: Internal Medicine | Admitting: Internal Medicine

## 2014-03-28 ENCOUNTER — Encounter (HOSPITAL_COMMUNITY): Payer: Self-pay | Admitting: Emergency Medicine

## 2014-03-28 DIAGNOSIS — D638 Anemia in other chronic diseases classified elsewhere: Secondary | ICD-10-CM | POA: Insufficient documentation

## 2014-03-28 DIAGNOSIS — Z7901 Long term (current) use of anticoagulants: Secondary | ICD-10-CM | POA: Insufficient documentation

## 2014-03-28 DIAGNOSIS — R55 Syncope and collapse: Secondary | ICD-10-CM | POA: Diagnosis present

## 2014-03-28 DIAGNOSIS — E785 Hyperlipidemia, unspecified: Secondary | ICD-10-CM | POA: Diagnosis not present

## 2014-03-28 DIAGNOSIS — Z8673 Personal history of transient ischemic attack (TIA), and cerebral infarction without residual deficits: Secondary | ICD-10-CM | POA: Diagnosis not present

## 2014-03-28 DIAGNOSIS — Z86010 Personal history of colonic polyps: Secondary | ICD-10-CM | POA: Insufficient documentation

## 2014-03-28 DIAGNOSIS — Z87891 Personal history of nicotine dependence: Secondary | ICD-10-CM | POA: Insufficient documentation

## 2014-03-28 DIAGNOSIS — N179 Acute kidney failure, unspecified: Secondary | ICD-10-CM | POA: Diagnosis not present

## 2014-03-28 DIAGNOSIS — D72829 Elevated white blood cell count, unspecified: Secondary | ICD-10-CM | POA: Insufficient documentation

## 2014-03-28 DIAGNOSIS — R040 Epistaxis: Secondary | ICD-10-CM | POA: Diagnosis not present

## 2014-03-28 DIAGNOSIS — R58 Hemorrhage, not elsewhere classified: Secondary | ICD-10-CM | POA: Diagnosis present

## 2014-03-28 DIAGNOSIS — I10 Essential (primary) hypertension: Secondary | ICD-10-CM | POA: Insufficient documentation

## 2014-03-28 DIAGNOSIS — M109 Gout, unspecified: Secondary | ICD-10-CM | POA: Diagnosis not present

## 2014-03-28 LAB — CBC
HCT: 29.7 % — ABNORMAL LOW (ref 39.0–52.0)
Hemoglobin: 9.6 g/dL — ABNORMAL LOW (ref 13.0–17.0)
MCH: 29.6 pg (ref 26.0–34.0)
MCHC: 32.3 g/dL (ref 30.0–36.0)
MCV: 91.7 fL (ref 78.0–100.0)
PLATELETS: 316 10*3/uL (ref 150–400)
RBC: 3.24 MIL/uL — ABNORMAL LOW (ref 4.22–5.81)
RDW: 14.6 % (ref 11.5–15.5)
WBC: 18.6 10*3/uL — AB (ref 4.0–10.5)

## 2014-03-28 LAB — URINALYSIS, ROUTINE W REFLEX MICROSCOPIC
BILIRUBIN URINE: NEGATIVE
GLUCOSE, UA: NEGATIVE mg/dL
HGB URINE DIPSTICK: NEGATIVE
KETONES UR: NEGATIVE mg/dL
Leukocytes, UA: NEGATIVE
Nitrite: NEGATIVE
PH: 5.5 (ref 5.0–8.0)
PROTEIN: 30 mg/dL — AB
Specific Gravity, Urine: 1.017 (ref 1.005–1.030)
Urobilinogen, UA: 0.2 mg/dL (ref 0.0–1.0)

## 2014-03-28 LAB — TSH: TSH: 1.953 u[IU]/mL (ref 0.350–4.500)

## 2014-03-28 LAB — BASIC METABOLIC PANEL
Anion gap: 4 — ABNORMAL LOW (ref 5–15)
BUN: 31 mg/dL — AB (ref 6–23)
CHLORIDE: 110 meq/L (ref 96–112)
CO2: 22 mmol/L (ref 19–32)
Calcium: 8.5 mg/dL (ref 8.4–10.5)
Creatinine, Ser: 1.71 mg/dL — ABNORMAL HIGH (ref 0.50–1.35)
GFR calc Af Amer: 42 mL/min — ABNORMAL LOW (ref >90)
GFR calc non Af Amer: 36 mL/min — ABNORMAL LOW (ref >90)
GLUCOSE: 110 mg/dL — AB (ref 70–99)
POTASSIUM: 4.4 mmol/L (ref 3.5–5.1)
SODIUM: 136 mmol/L (ref 135–145)

## 2014-03-28 LAB — I-STAT TROPONIN, ED: TROPONIN I, POC: 0 ng/mL (ref 0.00–0.08)

## 2014-03-28 LAB — URINE MICROSCOPIC-ADD ON

## 2014-03-28 LAB — PROTIME-INR
INR: 1.98 — AB (ref 0.00–1.49)
Prothrombin Time: 22.7 seconds — ABNORMAL HIGH (ref 11.6–15.2)

## 2014-03-28 MED ORDER — SODIUM CHLORIDE 0.9 % IV SOLN
INTRAVENOUS | Status: AC
  Administered 2014-03-28 – 2014-03-29 (×2): via INTRAVENOUS

## 2014-03-28 MED ORDER — TAMSULOSIN HCL 0.4 MG PO CAPS
0.4000 mg | ORAL_CAPSULE | Freq: Every day | ORAL | Status: DC
  Filled 2014-03-28 (×3): qty 1

## 2014-03-28 MED ORDER — SODIUM CHLORIDE 0.9 % IJ SOLN
3.0000 mL | Freq: Two times a day (BID) | INTRAMUSCULAR | Status: DC
  Administered 2014-03-28 – 2014-03-29 (×2): 3 mL via INTRAVENOUS

## 2014-03-28 MED ORDER — ONDANSETRON HCL 4 MG/2ML IJ SOLN: 4.0000 mg | Freq: Four times a day (QID) | INTRAMUSCULAR | Status: DC | PRN

## 2014-03-28 MED ORDER — ALLOPURINOL 300 MG PO TABS
300.0000 mg | ORAL_TABLET | Freq: Every day | ORAL | Status: DC
Start: 2014-03-28 — End: 2014-03-29
  Administered 2014-03-28: 300 mg via ORAL
  Filled 2014-03-28 (×2): qty 1

## 2014-03-28 MED ORDER — SIMVASTATIN 40 MG PO TABS: 40.0000 mg | ORAL_TABLET | Freq: Every day | ORAL | Status: DC

## 2014-03-28 MED ORDER — BRIMONIDINE TARTRATE 0.2 % OP SOLN
1.0000 [drp] | Freq: Two times a day (BID) | OPHTHALMIC | Status: DC
Start: 2014-03-28 — End: 2014-03-29
  Administered 2014-03-28 – 2014-03-29 (×2): 1 [drp] via OPHTHALMIC
  Filled 2014-03-28 (×2): qty 5

## 2014-03-28 MED ORDER — ONDANSETRON HCL 4 MG PO TABS: 4.0000 mg | ORAL_TABLET | Freq: Four times a day (QID) | ORAL | Status: DC | PRN

## 2014-03-28 MED ORDER — AMLODIPINE BESYLATE 5 MG PO TABS
5.0000 mg | ORAL_TABLET | Freq: Every day | ORAL | Status: DC
  Administered 2014-03-28: 5 mg via ORAL
  Filled 2014-03-28 (×3): qty 1

## 2014-03-28 MED ORDER — ATORVASTATIN CALCIUM 20 MG PO TABS
20.0000 mg | ORAL_TABLET | Freq: Every day | ORAL | Status: DC
  Filled 2014-03-28 (×2): qty 1

## 2014-03-28 NOTE — ED Notes (Signed)
Bed: WA03 Expected date:  Expected time:  Means of arrival:  Comments: Ems- nose bleed, on coumadin

## 2014-03-28 NOTE — ED Provider Notes (Signed)
CSN: [id number]     Arrival date & time 03/28/14  1107 History   First MD Initiated Contact with Patient 03/28/14 1126     Chief Complaint  Patient presents with  . Epistaxis     (Consider location/radiation/quality/duration/timing/severity/associated sxs/prior Treatment) Patient is a 81 y.o. male presenting with nosebleeds. The history is provided by the patient.  Epistaxis Location:  L nare Severity:  Moderate Timing:  Constant Progression:  Unchanged Chronicity:  New Context: anticoagulants and nose picking   Relieved by:  Nothing Worsened by:  Nothing tried Associated symptoms: syncope (passed out while in the bathroom this morning, no preceding symptoms)   Associated symptoms: no cough and no fever     Past Medical History  Diagnosis Date  . Gout   . Hyperlipidemia   . Hypertension   . Colon polyps   . Renal insufficiency   . Anemia   . ACUT DUOD ULCER W/HEMORR W/O MENTION OBSTRUCTION 11/24/2007    Qualifier: Diagnosis of  By: Alwyn Ren MD, Chrissie Noa    . Stroke     no deficits   Past Surgical History  Procedure Laterality Date  . Cataract extraction      right eye   No family history on file. History  Substance Use Topics  . Smoking status: Former Games developer  . Smokeless tobacco: Not on file  . Alcohol Use: No    Review of Systems  Constitutional: Negative for fever.  HENT: Positive for nosebleeds.   Respiratory: Negative for cough and shortness of breath.   Cardiovascular: Positive for syncope (passed out while in the bathroom this morning, no preceding symptoms).  Gastrointestinal: Negative for vomiting and abdominal pain.  All other systems reviewed and are negative.     Allergies  Review of patient's allergies indicates no known allergies.  Home Medications   Prior to Admission medications   Medication Sig Start Date End Date Taking? Authorizing Provider  allopurinol (ZYLOPRIM) 300 MG tablet TAKE 1 TABLET BY MOUTH ONCE A DAY 10/10/13  Yes Toniann Ket, PA-C  amLODipine (NORVASC) 5 MG tablet Take 5 mg by mouth daily.  10/10/13  Yes Toniann Ket, PA-C  brimonidine (ALPHAGAN) 0.2 % ophthalmic solution Place 1 drop into both eyes 2 (two) times daily.  09/07/13  Yes Historical Provider, MD  simvastatin (ZOCOR) 40 MG tablet Take 1 tablet (40 mg total) by mouth at bedtime. 10/10/13  Yes Toniann Ket, PA-C  tamsulosin [facility]) 0.4 MG CAPS capsule Take 1 capsule (0.4 mg total) by mouth daily after breakfast. 03/14/14  Yes Shelva Majestic, MD  warfarin (COUMADIN) 2.5 MG tablet TAKE AS DIRECTED BY ANTICOAGULATION CLINIC 03/26/14  Yes Baker Pierini, FNP   There were no vitals taken for this visit. Physical Exam  Constitutional: He is oriented to person, place, and time. He appears well-developed and well-nourished. No distress.  HENT:  Head: Normocephalic and atraumatic.  Mouth/Throat: Oropharynx is clear and moist. No oropharyngeal exudate.  Eyes: EOM are normal. Pupils are equal, round, and reactive to light.  Neck: Normal range of motion. Neck supple.  Cardiovascular: Normal rate and regular rhythm.  Exam reveals no friction rub.   No murmur heard. Pulmonary/Chest: Effort normal and breath sounds normal. No respiratory distress. He has no wheezes. He has no rales.  Abdominal: He exhibits no distension. There is no tenderness. There is no rebound.  Musculoskeletal: Normal range of motion. He exhibits no edema.  Neurological: He is alert and oriented to person, place, and  time.  Skin: No rash noted. He is not diaphoretic.  Nursing note and vitals reviewed.   ED Course  EPISTAXIS MANAGEMENT Date/Time: 03/29/2014 7:17 AM Performed by: Elwin MochaWALDEN, Sufyan Meidinger Authorized by: Elwin MochaWALDEN, Rebel Willcutt Consent: Verbal consent obtained. Patient sedated: no Treatment site: left anterior Repair method: silver nitrate Post-procedure assessment: bleeding stopped Treatment complexity: simple Patient tolerance: Patient tolerated the procedure well with no  immediate complications   (including critical care time) Labs Review Labs Reviewed  CBC  BASIC METABOLIC PANEL  PROTIME-INR  Rosezena SensorI-STAT TROPOININ, ED    Imaging Review Dg Chest 2 View  03/28/2014   CLINICAL DATA:  81 year old male with epistaxis on Coumadin. Syncope. Initial encounter.  EXAM: CHEST  2 VIEW  COMPARISON:  09/05/2011 and earlier.  FINDINGS: Semi upright AP and lateral views of the chest. Interval larger lung volumes and improved bibasilar ventilation. No pneumothorax, pleural effusion, pulmonary edema or confluent pulmonary opacity. No acute osseous abnormality identified. Visualized tracheal air column is within normal limits. Normal cardiac size and mediastinal contours.  IMPRESSION: No acute cardiopulmonary abnormality.   Electronically Signed   By: Augusto GambleLee  Hall M.D.   On: 03/28/2014 12:46   Ct Head Wo Contrast  03/28/2014   CLINICAL DATA:  Syncopal episode.  No prior CTA.  EXAM: CT HEAD WITHOUT CONTRAST  TECHNIQUE: Contiguous axial images were obtained from the base of the skull through the vertex without intravenous contrast.  COMPARISON:  09/05/2011  FINDINGS: There is no evidence of mass effect, midline shift, or extra-axial fluid collections. There is no evidence of a space-occupying lesion or intracranial hemorrhage. There is no evidence of a cortical-based area of acute infarction. There is generalized cerebral atrophy. There is periventricular white matter low attenuation likely secondary to microangiopathy.  The ventricles and sulci are appropriate for the patient's age. The basal cisterns are patent.  Visualized portions of the orbits are unremarkable. The visualized portions of the paranasal sinuses and mastoid air cells are unremarkable.  The osseous structures are unremarkable. 3 mm metallic foreign body in the right frontal scalp.  IMPRESSION: No acute intracranial pathology.   Electronically Signed   By: Elige KoHetal  Patel   On: 03/28/2014 13:42     EKG  Interpretation   Date/Time:  Thursday March 28 2014 12:25:55 EST Ventricular Rate:  86 PR Interval:  170 QRS Duration: 126 QT Interval:  387 QTC Calculation: 463 R Axis:   -46 Text Interpretation:  Sinus rhythm RBBB and LAFB No significant change  since last tracing Confirmed by Gwendolyn GrantWALDEN  MD, Tyrisha Benninger ([id number]) on 03/28/2014  12:30:54 PM      MDM   Final diagnoses:  Syncope  Epistaxis    81 year old male here with a nosebleed. Patient had a fair amount of bleeding, he has recurrent nosebleeds. He also has syncopal event. He walked to the bathroom and syncopized in the bathroom. No preceding symptoms. He did defecate himself after the syncopal event. Now he feels well, no history of syncope. No further nose bleeding. He has a left nare or clot without any current hemorrhage. We'll leave in place while we do a syncope workup. Syncope workup shows drop in Hgb by 2 grams. I think patient needs admission for serial Hgbs. I spoke with Dr. Jenne PaneBates with ENT who stated patient would benefit from a coumadin holiday to help stop his nosebleeds. On re-exam, no further bleeding, has some spots of fresh blood inside L nare, which I cauterized.   Admitted.  Elwin MochaBlair Joani Cosma, MD 03/29/14 [phone]

## 2014-03-28 NOTE — ED Notes (Addendum)
Per EMS: pt had nose bleed today that started about 0530 this morning, pt is on coumadin. No bleeding at the moment. Denies pain, weakness, or dizziness.

## 2014-03-28 NOTE — H&P (Addendum)
[facility] History and Physical  Daniel [name] ZOX:[id number] DOB: [birth year] DOA: 03/28/2014  Referring physician: ED physician PCP: Tana Conch, MD   Chief Complaint: epistaxis   HPI:  Pt is 81 yo male with history of stroke, on coumadin, presented to [facility] ED with main concern of one day duration of persistent epistaxis, associated with near syncope. Pt denies any chest pain or shortness of `breath, no abd or urinary concerns. Pt reports similar events in the past but generally did well on coumadin, no blood in urine or stool. No focal neurological problems.   In ED, pt noted to be hemodynamically stable, slightly orthostatic, bleed stopped in ED. ENT consulted over the phone and recommendation was to hold Coumadin for several days and possibly resume if H/H stable and no further bleed. TRH asked to admit for further evaluation.   Assessment and Plan: Active Problems:   Epistaxis - admit to telemetry unit - hold Coumadin - provide IVF - repeat CBC in AM   Syncope - likely related to acute bleed - I suspect orthostatics will improve with resolution of bleeding and IVF - monitor on tele    Anemia of chronic disease with acute bleed - in pt with coumadin - hold Coumadin and place on SCD for DVT prophylaxis    Acute renal failure - secondary to pre renal etiology - provide IVF and repeat BMP in AM   Leukocytosis - no clear signs of an infectious etiology  - CXR with no signs of PNA  - UA and urine culture pending  - will hold off on ABX for now as pt with no fever    HTN - continue home medical regimen    HLD - continue statin   Radiological Exams on Admission: Dg Chest 2 View  03/28/2014  No acute cardiopulmonary abnormality.   Ct Head Wo Contrast  03/28/2014  No acute intracranial pathology.     Code Status: Full Family Communication: Pt at bedside Disposition Plan: Admit for further evaluation     Review of Systems:  Constitutional: Negative for fever, chills  and malaise/fatigue. Negative for diaphoresis.  HENT: Negative for hearing loss, ear pain, congestion, sore throat, neck pain, tinnitus and ear discharge.   Eyes: Negative for blurred vision, double vision, photophobia, pain, discharge and redness.  Respiratory: Negative for cough, hemoptysis, sputum production, shortness of breath, wheezing and stridor.   Cardiovascular: Negative for chest pain, palpitations, orthopnea, claudication and leg swelling.  Gastrointestinal: Negative for nausea, vomiting and abdominal pain.  Genitourinary: Negative for dysuria, urgency, frequency, hematuria and flank pain.  Musculoskeletal: Negative for myalgias, back pain, joint pain.  Skin: Negative for itching and rash.  Neurological: Negative for tingling, tremors, sensory change, speech change,  and headaches.  Endo/Heme/Allergies: Negative for environmental allergies and polydipsia. Does not bruise/bleed easily.  Psychiatric/Behavioral: Negative for suicidal ideas. The patient is not nervous/anxious.      Past Medical History  Diagnosis Date  . Gout   . Hyperlipidemia   . Hypertension   . Colon polyps   . Renal insufficiency   . Anemia   . ACUT DUOD ULCER W/HEMORR W/O MENTION OBSTRUCTION 11/24/2007    Qualifier: Diagnosis of  By: Alwyn Ren MD, Chrissie Noa    . Stroke     no deficits    Past Surgical History  Procedure Laterality Date  . Cataract extraction      right eye    Social History:  reports that he has quit smoking. He does not have  any smokeless tobacco history on file. He reports that he does not drink alcohol. His drug history is not on file.  No Known Allergies  No significant family medical history   Prior to Admission medications   Medication Sig Start Date End Date Taking? Authorizing Provider  allopurinol (ZYLOPRIM) 300 MG tablet TAKE 1 TABLET BY MOUTH ONCE A DAY 10/10/13  Yes Toniann KetMatthew K Tucker, PA-C  amLODipine (NORVASC) 5 MG tablet Take 5 mg by mouth daily.  10/10/13  Yes Toniann KetMatthew K  Tucker, PA-C  brimonidine (ALPHAGAN) 0.2 % ophthalmic solution Place 1 drop into both eyes 2 (two) times daily.  09/07/13  Yes Historical Provider, MD  simvastatin (ZOCOR) 40 MG tablet Take 1 tablet (40 mg total) by mouth at bedtime. 10/10/13  Yes Toniann KetMatthew K Tucker, PA-C  tamsulosin [facility](FLOMAX) 0.4 MG CAPS capsule Take 1 capsule (0.4 mg total) by mouth daily after breakfast. 03/14/14  Yes Shelva MajesticStephen O Hunter, MD  warfarin (COUMADIN) 2.5 MG tablet TAKE AS DIRECTED BY ANTICOAGULATION CLINIC 03/26/14  Yes Baker PieriniPadonda B Campbell, FNP    Physical Exam: Filed Vitals:   03/28/14 1432  BP: 131/67  Pulse: 89  TempSrc: Oral  Resp: 16  SpO2: 98%    Physical Exam  Constitutional: Appears well-developed and well-nourished. No distress.  HENT: Normocephalic. External right and left ear normal. Oropharynx is clear and moist.  Eyes: Conjunctivae and EOM are normal. PERRLA, no scleral icterus.  Neck: Normal ROM. Neck supple. No JVD. No tracheal deviation. No thyromegaly.  CVS: RRR, S1/S2 +, no murmurs, no gallops, no carotid bruit.  Pulmonary: Effort and breath sounds normal, no stridor, rhonchi, wheezes, rales.  Abdominal: Soft. BS +,  no distension, tenderness, rebound or guarding.  Musculoskeletal: Normal range of motion. No edema and no tenderness.  Lymphadenopathy: No lymphadenopathy noted, cervical, inguinal. Neuro: Alert. Normal reflexes, muscle tone coordination. No cranial nerve deficit. Skin: Skin is warm and dry. No rash noted. Not diaphoretic. No erythema. No pallor.  Psychiatric: Normal mood and affect. Behavior, judgment, thought content normal.   Labs on Admission:  Basic Metabolic Panel:  Recent Labs Lab 03/28/14 1223  NA 136  K 4.4  CL 110  CO2 22  GLUCOSE 110*  BUN 31*  CREATININE 1.71*  CALCIUM 8.5   CBC:  Recent Labs Lab 03/28/14 1223  WBC 18.6*  HGB 9.6*  HCT 29.7*  MCV 91.7  PLT 316    EKG: Normal sinus rhythm, no ST/T wave changes  Debbora PrestoMAGICK-Flannery Cavallero, MD  [facility] Pager [phone]  If 7PM-7AM, please contact night-coverage [email or web address] Password [email or web address] 03/28/2014, 3:51 PM

## 2014-03-29 ENCOUNTER — Telehealth: Payer: Self-pay | Admitting: Family Medicine

## 2014-03-29 DIAGNOSIS — R58 Hemorrhage, not elsewhere classified: Secondary | ICD-10-CM | POA: Diagnosis present

## 2014-03-29 LAB — CBC
HCT: 27.8 % — ABNORMAL LOW (ref 39.0–52.0)
Hemoglobin: 8.7 g/dL — ABNORMAL LOW (ref 13.0–17.0)
MCH: 28.5 pg (ref 26.0–34.0)
MCHC: 31.3 g/dL (ref 30.0–36.0)
MCV: 91.1 fL (ref 78.0–100.0)
Platelets: 306 10*3/uL (ref 150–400)
RBC: 3.05 MIL/uL — ABNORMAL LOW (ref 4.22–5.81)
RDW: 14.7 % (ref 11.5–15.5)
WBC: 16.6 10*3/uL — AB (ref 4.0–10.5)

## 2014-03-29 LAB — URINE CULTURE
Colony Count: NO GROWTH
Culture: NO GROWTH

## 2014-03-29 LAB — PROTIME-INR
INR: 1.98 — ABNORMAL HIGH (ref 0.00–1.49)
Prothrombin Time: 22.7 seconds — ABNORMAL HIGH (ref 11.6–15.2)

## 2014-03-29 LAB — BASIC METABOLIC PANEL
Anion gap: 8 (ref 5–15)
BUN: 25 mg/dL — AB (ref 6–23)
CHLORIDE: 104 meq/L (ref 96–112)
CO2: 23 mmol/L (ref 19–32)
CREATININE: 1.7 mg/dL — AB (ref 0.50–1.35)
Calcium: 8.3 mg/dL — ABNORMAL LOW (ref 8.4–10.5)
GFR calc Af Amer: 42 mL/min — ABNORMAL LOW (ref >90)
GFR, EST NON AFRICAN AMERICAN: 37 mL/min — AB (ref >90)
GLUCOSE: 106 mg/dL — AB (ref 70–99)
POTASSIUM: 4.5 mmol/L (ref 3.5–5.1)
Sodium: 135 mmol/L (ref 135–145)

## 2014-03-29 LAB — IRON AND TIBC
Iron: 34 ug/dL — ABNORMAL LOW (ref 42–165)
Saturation Ratios: 14 % — ABNORMAL LOW (ref 20–55)
TIBC: 239 ug/dL (ref 215–435)
UIBC: 205 ug/dL (ref 125–400)

## 2014-03-29 LAB — RETICULOCYTES
RBC.: 3.09 MIL/uL — AB (ref 4.22–5.81)
Retic Count, Absolute: 58.7 10*3/uL (ref 19.0–186.0)
Retic Ct Pct: 1.9 % (ref 0.4–3.1)

## 2014-03-29 MED ORDER — ATORVASTATIN CALCIUM 20 MG PO TABS: 20.0000 mg | ORAL_TABLET | Freq: Every day | ORAL | Status: DC

## 2014-03-29 NOTE — Discharge Summary (Signed)
Physician Discharge Summary  Daniel [name] ZOX:[id number] DOB: 1932/10/12 DOA: 03/28/2014  PCP: Tana ConchHUNTER, STEPHEN, MD  Admit date: 03/28/2014 Discharge date: 03/29/2014  Time spent: 30 minutes  Recommendations for Outpatient Follow-up:  1. Follow up with ENT AND PCP as recommended 2. Follow up anemia panel with PCP.   Discharge Diagnoses:  Active Problems:   Epistaxis   Discharge Condition: improved.   Diet recommendation: low sodium diet  Filed Weights   03/28/14 1626  Weight: 65.409 kg (144 lb 3.2 oz)    History of present illness/hospital course: Pt is 81 yo male with history of stroke, on coumadin, [facility] with main concern of one day duration of persistent epistaxis, associated with near syncope. He has had multiple episodes of epistaxis and saw his ENT a week ago and was told to hold coumadin for a week and stop picking his nose. But he reported he started picking his note and tried to take some scabs, and started bleeding. His bleeding has stopped now. His orthostatics are negative. His hemoglobin has dropped from 12 in novemeber to 8.7 today. Anemia panel ordered and they are pending. Patient wanted to go home and he is persistent. We will discharge him and request him to follow up with his PCP and follow up anemia panel . His leukocytosis was persistent from last year, no signs of infection found. Follow up outpatient.     Procedures:  none  Consultations:  none  Discharge Exam: Filed Vitals:   03/29/14 0433  BP: 115/69  Pulse: 76  Temp: 97.8 F (36.6 C)  Resp: 20    General: alert afebrile comfortable Cardiovascular: s1s2 Respiratory: ctab  Discharge Instructions   Discharge Instructions    Discharge instructions    Complete by:  As directed   Follow up with PCP in one week.          Current Discharge Medication List    START taking these medications   Details  atorvastatin (LIPITOR) 20 MG tablet Take 1 tablet (20 mg total) by mouth  daily at 6 PM. Qty: 30 tablet, Refills: 1      CONTINUE these medications which have NOT CHANGED   Details  allopurinol (ZYLOPRIM) 300 MG tablet TAKE 1 TABLET BY MOUTH ONCE A DAY Qty: 90 tablet, Refills: 1    amLODipine (NORVASC) 5 MG tablet Take 5 mg by mouth daily.     brimonidine (ALPHAGAN) 0.2 % ophthalmic solution Place 1 drop into both eyes 2 (two) times daily.     tamsulosin (FLOMAX) 0.4 MG CAPS capsule Take 1 capsule (0.4 mg total) by mouth daily after breakfast. Qty: 30 capsule, Refills: 3   Associated Diagnoses: BPH (benign prostatic hyperplasia)      STOP taking these medications     simvastatin (ZOCOR) 40 MG tablet      warfarin (COUMADIN) 2.5 MG tablet        No Known Allergies Follow-up Information    Follow up with Tana ConchHUNTER, STEPHEN, MD In 1 week.   Specialty:  Family Medicine   Contact information:   [address] [place] [address] [phone]        The results of significant diagnostics from this hospitalization (including imaging, microbiology, ancillary and laboratory) are listed below for reference.    Significant Diagnostic Studies: Dg Chest 2 View  03/28/2014   CLINICAL DATA:  81 year old male with epistaxis on Coumadin. Syncope. Initial encounter.  EXAM: CHEST  2 VIEW  COMPARISON:  09/05/2011 and earlier.  FINDINGS: Semi upright AP and lateral views of the chest. Interval larger lung volumes and improved bibasilar ventilation. No pneumothorax, pleural effusion, pulmonary edema or confluent pulmonary opacity. No acute osseous abnormality identified. Visualized tracheal air column is within normal limits. Normal cardiac size and mediastinal contours.  IMPRESSION: No acute cardiopulmonary abnormality.   Electronically Signed   By: Augusto Gamble M.D.   On: 03/28/2014 12:46   Ct Head Wo Contrast  03/28/2014   CLINICAL DATA:  Syncopal episode.  No prior CTA.  EXAM: CT HEAD WITHOUT CONTRAST  TECHNIQUE: Contiguous axial images were obtained from the  base of the skull through the vertex without intravenous contrast.  COMPARISON:  09/05/2011  FINDINGS: There is no evidence of mass effect, midline shift, or extra-axial fluid collections. There is no evidence of a space-occupying lesion or intracranial hemorrhage. There is no evidence of a cortical-based area of acute infarction. There is generalized cerebral atrophy. There is periventricular white matter low attenuation likely secondary to microangiopathy.  The ventricles and sulci are appropriate for the patient's age. The basal cisterns are patent.  Visualized portions of the orbits are unremarkable. The visualized portions of the paranasal sinuses and mastoid air cells are unremarkable.  The osseous structures are unremarkable. 3 mm metallic foreign body in the right frontal scalp.  IMPRESSION: No acute intracranial pathology.   Electronically Signed   By: Elige Ko   On: 03/28/2014 13:42    Microbiology: No results found for this or any previous visit (from the past 240 hour(s)).   Labs: Basic Metabolic Panel:  Recent Labs Lab 03/28/14 1223 03/29/14 0422  NA 136 135  K 4.4 4.5  CL 110 104  CO2 22 23  GLUCOSE 110* 106*  BUN 31* 25*  CREATININE 1.71* 1.70*  CALCIUM 8.5 8.3*   Liver Function Tests: No results for input(s): AST, ALT, ALKPHOS, BILITOT, PROT, ALBUMIN in the last 168 hours. No results for input(s): LIPASE, AMYLASE in the last 168 hours. No results for input(s): AMMONIA in the last 168 hours. CBC:  Recent Labs Lab 03/28/14 1223 03/29/14 0422  WBC 18.6* 16.6*  HGB 9.6* 8.7*  HCT 29.7* 27.8*  MCV 91.7 91.1  PLT 316 306   Cardiac Enzymes: No results for input(s): CKTOTAL, CKMB, CKMBINDEX, TROPONINI in the last 168 hours. BNP: BNP (last 3 results) No results for input(s): PROBNP in the last 8760 hours. CBG: No results for input(s): GLUCAP in the last 168 hours.     SignedKathlen Mody  [facility] 03/29/2014, 12:04 PM

## 2014-03-29 NOTE — Telephone Encounter (Signed)
Please advise 

## 2014-03-29 NOTE — Telephone Encounter (Signed)
Pt said that he was told by Dr Kirtland BouchardK that he would except him as a transfer from Dr Durene CalHunter. He said he need to come in and discuss him not taking warfarin.

## 2014-03-29 NOTE — Telephone Encounter (Signed)
Ok for [employer] next week

## 2014-03-30 LAB — VITAMIN B12: VITAMIN B 12: 304 pg/mL (ref 211–911)

## 2014-03-30 LAB — FOLATE: Folate: 8.5 ng/mL

## 2014-03-30 LAB — FERRITIN: Ferritin: 128 ng/mL (ref 22–322)

## 2014-04-01 ENCOUNTER — Ambulatory Visit: Payer: Medicare Other | Admitting: Family

## 2014-04-03 ENCOUNTER — Ambulatory Visit (INDEPENDENT_AMBULATORY_CARE_PROVIDER_SITE_OTHER): Payer: Medicare Other | Admitting: Internal Medicine

## 2014-04-03 ENCOUNTER — Encounter: Payer: Self-pay | Admitting: Internal Medicine

## 2014-04-03 ENCOUNTER — Encounter (INDEPENDENT_AMBULATORY_CARE_PROVIDER_SITE_OTHER): Payer: Medicare Other | Admitting: Family Medicine

## 2014-04-03 VITALS — BP 122/60 | HR 87 | Temp 98.0°F | Resp 20 | Ht 66.5 in | Wt 148.0 lb

## 2014-04-03 DIAGNOSIS — E785 Hyperlipidemia, unspecified: Secondary | ICD-10-CM

## 2014-04-03 DIAGNOSIS — N183 Chronic kidney disease, stage 3 unspecified: Secondary | ICD-10-CM

## 2014-04-03 DIAGNOSIS — Z8679 Personal history of other diseases of the circulatory system: Secondary | ICD-10-CM

## 2014-04-03 DIAGNOSIS — D62 Acute posthemorrhagic anemia: Secondary | ICD-10-CM

## 2014-04-03 DIAGNOSIS — I10 Essential (primary) hypertension: Secondary | ICD-10-CM

## 2014-04-03 DIAGNOSIS — R040 Epistaxis: Secondary | ICD-10-CM

## 2014-04-03 DIAGNOSIS — I480 Paroxysmal atrial fibrillation: Secondary | ICD-10-CM

## 2014-04-03 LAB — CBC WITH DIFFERENTIAL/PLATELET
BASOS ABS: 0 10*3/uL (ref 0.0–0.1)
Basophils Relative: 0.3 % (ref 0.0–3.0)
EOS PCT: 1.6 % (ref 0.0–5.0)
Eosinophils Absolute: 0.2 10*3/uL (ref 0.0–0.7)
HCT: 28.4 % — ABNORMAL LOW (ref 39.0–52.0)
Hemoglobin: 9.1 g/dL — ABNORMAL LOW (ref 13.0–17.0)
Lymphocytes Relative: 17.3 % (ref 12.0–46.0)
Lymphs Abs: 2.3 10*3/uL (ref 0.7–4.0)
MCHC: 32 g/dL (ref 30.0–36.0)
MCV: 91.6 fl (ref 78.0–100.0)
MONO ABS: 0.9 10*3/uL (ref 0.1–1.0)
MONOS PCT: 6.8 % (ref 3.0–12.0)
NEUTROS PCT: 74 % (ref 43.0–77.0)
Neutro Abs: 9.9 10*3/uL — ABNORMAL HIGH (ref 1.4–7.7)
Platelets: 305 10*3/uL (ref 150.0–400.0)
RBC: 3.1 Mil/uL — ABNORMAL LOW (ref 4.22–5.81)
RDW: 15.5 % (ref 11.5–15.5)
WBC: 13.3 10*3/uL — AB (ref 4.0–10.5)

## 2014-04-03 MED ORDER — ATORVASTATIN CALCIUM 20 MG PO TABS: 20.0000 mg | ORAL_TABLET | Freq: Every day | ORAL | Status: DC

## 2014-04-03 MED ORDER — WARFARIN SODIUM 2.5 MG PO TABS: ORAL_TABLET | ORAL | Status: DC

## 2014-04-03 NOTE — Progress Notes (Signed)
Subjective:    Patient ID: Daniel [name], male    DOB: 11/26/1932, 81 y.o.   MRN: [id number]  HPI 81 year old patient who has a history of paroxysmal atrial fibrillation.  He has been on chronic Coumadin anticoagulation, but has a several month history of recurrent nosebleeds.  Coumadin therapy.  Presently on hold due to recurrent epistaxis associated with anemia.  He has been seen by ENT He has a high chads score due to age, hypertension and prior stroke in 2002  Past Medical History  Diagnosis Date  . Gout   . Hyperlipidemia   . Hypertension   . Colon polyps   . Renal insufficiency   . Anemia   . ACUT DUOD ULCER W/HEMORR W/O MENTION OBSTRUCTION 11/24/2007    Qualifier: Diagnosis of  By: Alwyn Ren MD, Chrissie Noa    . Stroke     no deficits    History   Social History  . Marital Status: Married    Spouse Name: N/A    Number of Children: N/A  . Years of Education: N/A   Occupational History  . Not on file.   Social History Main Topics  . Smoking status: Former Smoker    Types: Cigarettes  . Smokeless tobacco: Not on file  . Alcohol Use: No  . Drug Use: Not on file  . Sexual Activity: Not on file   Other Topics Concern  . Not on file   Social History Narrative   Patient's [name] faith is very important to him.   [employer] [place] for at least last 10 years. Raised in [place] and lived in [place] for long period time.   Volunteers at [employer] long    Past Surgical History  Procedure Laterality Date  . Cataract extraction      right eye    No family history on file.  No Known Allergies  Current Outpatient Prescriptions on File Prior to Visit  Medication Sig Dispense Refill  . allopurinol (ZYLOPRIM) 300 MG tablet TAKE 1 TABLET BY MOUTH ONCE A DAY 90 tablet 1  . amLODipine (NORVASC) 5 MG tablet Take 5 mg by mouth daily.     . brimonidine (ALPHAGAN) 0.2 % ophthalmic solution Place 1 drop into both eyes 2 (two) times daily.     . tamsulosin (FLOMAX) 0.4 MG CAPS  capsule Take 1 capsule (0.4 mg total) by mouth daily after breakfast. 30 capsule 3   No current facility-administered medications on file prior to visit.    BP 122/60 mmHg  Pulse 87  Temp(Src) 98 F (36.7 C) (Oral)  Resp 20  Ht 5' 6.5" (1.689 m)  Wt 148 lb (67.132 kg)  BMI 23.53 kg/m2  SpO2 98%      Review of Systems  Constitutional: Negative for fever, chills, appetite change and fatigue.  HENT: Positive for nosebleeds. Negative for congestion, dental problem, ear pain, hearing loss, sore throat, tinnitus, trouble swallowing and voice change.   Eyes: Negative for pain, discharge and visual disturbance.  Respiratory: Negative for cough, chest tightness, wheezing and stridor.   Cardiovascular: Negative for chest pain, palpitations and leg swelling.  Gastrointestinal: Negative for nausea, vomiting, abdominal pain, diarrhea, constipation, blood in stool and abdominal distention.  Genitourinary: Negative for urgency, hematuria, flank pain, discharge, difficulty urinating and genital sores.  Musculoskeletal: Negative for myalgias, back pain, joint swelling, arthralgias, gait problem and neck stiffness.  Skin: Negative for rash.  Neurological: Negative for dizziness, syncope, speech difficulty, weakness, numbness and headaches.  Hematological: Negative for  adenopathy. Does not bruise/bleed easily.  Psychiatric/Behavioral: Negative for behavioral problems and dysphoric mood. The patient is not nervous/anxious.        Objective:   Physical Exam  Constitutional: He is oriented to person, place, and time. He appears well-developed.  HENT:  Head: Normocephalic.  Right Ear: External ear normal.  Left Ear: External ear normal.  Eyes: Conjunctivae and EOM are normal.  Neck: Normal range of motion.  Cardiovascular: Normal rate and normal heart sounds.   Pulmonary/Chest: Breath sounds normal.  Abdominal: Bowel sounds are normal.  Musculoskeletal: Normal range of motion. He exhibits no  edema or tenderness.  Neurological: He is alert and oriented to person, place, and time.  Psychiatric: He has a normal mood and affect. His behavior is normal.          Assessment & Plan:   Recurrent epistaxis.  Patient has been asked to avoid nose picking and to apply Vaseline for protection and moisture several times daily.  He will begin using a humidifier Paroxysmal atrial fibrillation.  Benefits of anticoagulation discussed Chronic Coumadin anticoagulation.  Will resume Coumadin.  Goal INR 1.5 to 2.5

## 2014-04-03 NOTE — Progress Notes (Signed)
Pre visit review using our clinic review tool, if applicable. No additional management support is needed unless otherwise documented below in the visit note. 

## 2014-04-03 NOTE — Patient Instructions (Addendum)
Coumadin 2.5 milligrams-  1 tablet 5 times per week;  1 half tablet each Monday and Thursday  Return in 2 weeks for a PT/INR.  Please schedule today  Use a humidifier as discussed  Use Vaseline to coat the lining inside your nose  Take an iron supplement twice daily

## 2014-04-04 NOTE — Progress Notes (Signed)
  This encounter was created in error - please disregard. Patient declined to see me in favor of seeing Dr. Kirtland BouchardK who he had seen previously.

## 2014-04-10 ENCOUNTER — Telehealth: Payer: Self-pay | Admitting: Family

## 2014-04-10 NOTE — Telephone Encounter (Signed)
Schedule PT/INR visit in 3 weeks.

## 2014-04-10 NOTE — Telephone Encounter (Signed)
Pt is scheduled for 1/28

## 2014-04-18 ENCOUNTER — Ambulatory Visit (INDEPENDENT_AMBULATORY_CARE_PROVIDER_SITE_OTHER): Payer: Medicare Other | Admitting: Family

## 2014-04-18 DIAGNOSIS — Z5181 Encounter for therapeutic drug level monitoring: Secondary | ICD-10-CM

## 2014-04-18 DIAGNOSIS — I480 Paroxysmal atrial fibrillation: Secondary | ICD-10-CM

## 2014-04-18 LAB — POCT INR: INR: 2.3

## 2014-04-18 NOTE — Patient Instructions (Signed)
Continue to take 1 tablet daily except 1/2 tablet on Wed.  Re-check in 6 weeks at patient request.  Anticoagulation Dose Instructions as of 04/18/2014      Glynis SmilesSun Mon Tue Wed Thu Fri Sat   New Dose 2.5 mg 2.5 mg 2.5 mg 1.25 mg 2.5 mg 2.5 mg 2.5 mg    Description        Continue to take 1 tablet daily except 1/2 tablet on Wed.  Re-check in 6 weeks at patient request.

## 2014-04-19 ENCOUNTER — Encounter: Payer: Self-pay | Admitting: Gastroenterology

## 2014-04-30 ENCOUNTER — Encounter: Payer: Self-pay | Admitting: Family Medicine

## 2014-04-30 ENCOUNTER — Ambulatory Visit (INDEPENDENT_AMBULATORY_CARE_PROVIDER_SITE_OTHER): Payer: Medicare Other | Admitting: Family Medicine

## 2014-04-30 VITALS — BP 118/68 | HR 73 | Temp 97.4°F | Ht 66.5 in | Wt 147.3 lb

## 2014-04-30 DIAGNOSIS — R21 Rash and other nonspecific skin eruption: Secondary | ICD-10-CM

## 2014-04-30 MED ORDER — TRIAMCINOLONE ACETONIDE 0.1 % EX CREA: 1.0000 "application " | TOPICAL_CREAM | Freq: Two times a day (BID) | CUTANEOUS | Status: DC

## 2014-04-30 NOTE — Progress Notes (Signed)
HPI:  Rash: -started a few days ago -itchy bumps on arms and legs mostly - notices more at night -he can't think of any new exposure except for iron and he thinks this caused the rash as he started right before this happens -hx a. Fib on coumadin, recent nose bleeds and low hgb so started iron, reports bleeding resolved at this time -denies SOB, throat face or tongue swelling, fevers, chills, malaise, GI symptoms  ROS: See pertinent positives and negatives per HPI.  Past Medical History  Diagnosis Date  . Gout   . Hyperlipidemia   . Hypertension   . Colon polyps   . Renal insufficiency   . Anemia   . ACUT DUOD ULCER W/HEMORR W/O MENTION OBSTRUCTION 11/24/2007    Qualifier: Diagnosis of  By: Alwyn Ren MD, Chrissie Noa    . Stroke     no deficits    Past Surgical History  Procedure Laterality Date  . Cataract extraction      right eye    No family history on file.  History   Social History  . Marital Status: Married    Spouse Name: N/A    Number of Children: N/A  . Years of Education: N/A   Social History Main Topics  . Smoking status: Former Smoker    Types: Cigarettes  . Smokeless tobacco: None  . Alcohol Use: No  . Drug Use: None  . Sexual Activity: None   Other Topics Concern  . None   Social History Narrative   Patient's Daniel [name] faith is very important to him.   [name] for at least last 10 years. Raised in [place] and lived in [place] for long period time.   Volunteers at [employer]     Current outpatient prescriptions:  .  allopurinol (ZYLOPRIM) 300 MG tablet, TAKE 1 TABLET BY MOUTH ONCE A DAY, Disp: 90 tablet, Rfl: 1 .  amLODipine (NORVASC) 5 MG tablet, Take 5 mg by mouth daily. , Disp: , Rfl:  .  atorvastatin (LIPITOR) 20 MG tablet, Take 1 tablet (20 mg total) by mouth daily at 6 PM., Disp: 90 tablet, Rfl: 4 .  brimonidine (ALPHAGAN) 0.2 % ophthalmic solution, Place 1 drop into both eyes 2 (two) times daily. , Disp: , Rfl:  .  tamsulosin  (FLOMAX) 0.4 MG CAPS capsule, Take 1 capsule (0.4 mg total) by mouth daily after breakfast., Disp: 30 capsule, Rfl: 3 .  warfarin (COUMADIN) 2.5 MG tablet, One tablet 5 times per week 1 half tablet Monday and Thursday, Disp: 60 tablet, Rfl: 3 .  famotidine (PEPCID AC) 10 MG tablet, Take 1 tablet (10 mg total) by mouth 2 (two) times daily., Disp: 7 tablet, Rfl: 0 .  triamcinolone cream (KENALOG) 0.1 %, Apply 1 application topically 2 (two) times daily., Disp: 30 g, Rfl: 0  EXAM:  Filed Vitals:   04/30/14 1509  BP: 118/68  Pulse: 73  Temp: 97.4 F (36.3 C)    Body mass index is 23.42 kg/(m^2).  GENERAL: vitals reviewed and listed above, alert, oriented, appears well hydrated and in no acute distress  HEENT: atraumatic, conjunttiva clear, no obvious abnormalities on inspection of external nose and ears  NECK: no obvious masses on inspection  LUNGS: clear to auscultation bilaterally, no wheezes, rales or rhonchi, good air movement  CV: HRRR, no peripheral edema  SKIN: erythematous raised sharply demarcated patches on legs and arms and low back  MS: moves all extremities without noticeable abnormality  PSYCH: pleasant and cooperative,  no obvious depression or anxiety  ASSESSMENT AND PLAN:  Discussed the following assessment and plan:  Rash and nonspecific skin eruption - Plan: triamcinolone cream (KENALOG) 0.1 %  -query reaction to iron per his concern -opted to hold on iron for now with list of naturally iron rich foods provided - though will need to notify his pcp and coumadin clinic if dietary changes -insect bites less likely but could appear this way after excessive scratching -follow up in 1 week -Patient advised to return or notify a doctor immediately if symptoms worsen or persist or new concerns arise.  Patient Instructions  -stop iron  -use cream 2 times daily as needed for itching  -if changes in diet please notify your coumadin clinic  -follow up with you  doctor in 1 week or sooner if any concerns or worsening  -over the counter pepcid daily     Daniel [name] R.

## 2014-04-30 NOTE — Patient Instructions (Signed)
-  stop iron  -use cream 2 times daily as needed for itching  -if changes in diet please notify your coumadin clinic  -follow up with you doctor in 1 week or sooner if any concerns or worsening  -over the counter pepcid daily

## 2014-04-30 NOTE — Progress Notes (Signed)
Pre visit review using our clinic review tool, if applicable. No additional management support is needed unless otherwise documented below in the visit note. 

## 2014-05-03 ENCOUNTER — Telehealth: Payer: Self-pay | Admitting: Family Medicine

## 2014-05-03 NOTE — Telephone Encounter (Signed)
Pt request refill  90 day allopurinol (ZYLOPRIM) 300 MG tablet and amLODipine (NORVASC) 5 MG tablet  [facility]   Pt states even though he established w/ dr Therapist, nutritionalhunter, dr k told him he would accept him as a pt b/c his wife is a pt w/ dr Kirtland Bouchardk also.So who will fill this med?

## 2014-05-06 ENCOUNTER — Other Ambulatory Visit: Payer: Self-pay

## 2014-05-06 MED ORDER — AMLODIPINE BESYLATE 5 MG PO TABS: 5.0000 mg | ORAL_TABLET | Freq: Every day | ORAL | Status: DC

## 2014-05-06 MED ORDER — ALLOPURINOL 300 MG PO TABS: ORAL_TABLET | ORAL | Status: DC

## 2014-05-06 NOTE — Telephone Encounter (Signed)
Medications were already sent in.

## 2014-05-06 NOTE — Telephone Encounter (Signed)
Rx request for:  allopurinol 300 mg tablet-Take 1 tablet by mouth once a day #90  Amlodipine besylate 5 mg tablet-TAke 1 tablet by mouth once a day. #90  [facility]

## 2014-05-07 ENCOUNTER — Ambulatory Visit: Payer: Medicare Other | Admitting: Internal Medicine

## 2014-05-09 ENCOUNTER — Encounter: Payer: Self-pay | Admitting: Family Medicine

## 2014-05-09 ENCOUNTER — Ambulatory Visit (INDEPENDENT_AMBULATORY_CARE_PROVIDER_SITE_OTHER): Payer: Medicare Other | Admitting: General Practice

## 2014-05-09 ENCOUNTER — Ambulatory Visit (INDEPENDENT_AMBULATORY_CARE_PROVIDER_SITE_OTHER): Payer: Medicare Other | Admitting: Family Medicine

## 2014-05-09 VITALS — BP 108/70 | Temp 97.6°F | Wt 148.0 lb

## 2014-05-09 DIAGNOSIS — I480 Paroxysmal atrial fibrillation: Secondary | ICD-10-CM

## 2014-05-09 DIAGNOSIS — Z5181 Encounter for therapeutic drug level monitoring: Secondary | ICD-10-CM

## 2014-05-09 DIAGNOSIS — R21 Rash and other nonspecific skin eruption: Secondary | ICD-10-CM

## 2014-05-09 LAB — POCT INR: INR: 1.1

## 2014-05-09 NOTE — Patient Instructions (Signed)
Glad the rash is better  Check INR-goal 1.5 to 2.5.

## 2014-05-09 NOTE — Progress Notes (Signed)
Pre visit review using our clinic review tool, if applicable. No additional management support is needed unless otherwise documented below in the visit note. 

## 2014-05-09 NOTE — Assessment & Plan Note (Addendum)
I advised patient of risks of taking lower than suggested coumadin dose including CVA specifically. He would prefer to not use vaseline even though it works for unclear reasons to prevent nosebleeds. We sent him for INR which was subtherapeutic but he refuses higher doses.   Advised 1 week follow up per pt/inr notes. This is also a difficult situation as patient has been accepted by Dr. Kirtland BouchardK but is still uncertain who he wants to follow up with. At this point, benefits of coumadin for individualized patient may be lower than risks. Follow up 1 week.

## 2014-05-09 NOTE — Progress Notes (Signed)
  Tana ConchStephen Hunter, MD Phone: [phone]  Subjective:   Daniel [name] is a 81 y.o. year old very pleasant male patient who presents with the following:  Rash- resolved Resolved off iron. Took steroid cream as well for itch ROS-not ill appearing, no fever/chills. No new medications. Not immunocompromised. No mucus membrane involvement.   Atrial Fibrillation- rate controlled, likely undercoagulated Still having some nosebleeds when taking recommended coumadin dose. He has started taking 1/2 pill. He used vaseline a few times and stated it works but he would prefer to take lower dose of coumadin.  ROS- denies palpitations or chest pain   Past Medical History- Patient Active Problem List   Diagnosis Date Noted  . Atrial fibrillation 11/04/2006    Priority: High  . History of cardiovascular disorder 08/24/2006    Priority: High  . BPH (benign prostatic hyperplasia) 10/31/2013    Priority: Medium  . Dyslipidemia 08/24/2006    Priority: Medium  . GOUT 08/24/2006    Priority: Medium  . Essential hypertension 08/24/2006    Priority: Medium  . CKD (chronic kidney disease), stage III 08/24/2006    Priority: Medium  . PULMONARY NODULE, RIGHT UPPER LOBE 11/24/2007    Priority: Low  . Bleeding nose 03/29/2014  . Epistaxis 03/28/2014  . Encounter for therapeutic drug monitoring 07/05/2013   Medications- reviewed and updated Current Outpatient Prescriptions  Medication Sig Dispense Refill  . allopurinol (ZYLOPRIM) 300 MG tablet TAKE 1 TABLET BY MOUTH ONCE A DAY 90 tablet 1  . amLODipine (NORVASC) 5 MG tablet Take 1 tablet (5 mg total) by mouth daily. 90 tablet 1  . atorvastatin (LIPITOR) 20 MG tablet Take 1 tablet (20 mg total) by mouth daily at 6 PM. 90 tablet 4  . famotidine (PEPCID AC) 10 MG tablet Take 1 tablet (10 mg total) by mouth 2 (two) times daily. 7 tablet 0  . tamsulosin (FLOMAX) 0.4 MG CAPS capsule Take 1 capsule (0.4 mg total) by mouth daily after breakfast. 30 capsule 3    . warfarin (COUMADIN) 2.5 MG tablet One tablet 5 times per week 1 half tablet Monday and Thursday 60 tablet 3  . brimonidine (ALPHAGAN) 0.2 % ophthalmic solution Place 1 drop into both eyes 2 (two) times daily.     Marland Kitchen. triamcinolone cream (KENALOG) 0.1 % Apply 1 application topically 2 (two) times daily. (Patient not taking: Reported on 05/09/2014) 30 g 0   No current facility-administered medications for this visit.    Objective: BP 108/70 mmHg  Temp(Src) 97.6 F (36.4 C)  Wt 148 lb (67.132 kg) Gen: NAD, resting comfortably in char CV: irregularly irregular no rubs or gallops Lungs: CTAB no crackles, wheeze, rhonchi Ext: no edema Skin: warm, dry, upper and lower extremity exam reveal no rash  Assessment/Plan:  Atrial fibrillation I advised patient of risks of taking lower than suggested coumadin dose including CVA specifically. He would prefer to not use vaseline even though it works for unclear reasons to prevent nosebleeds. We sent him for INR which was subtherapeutic but he refuses higher doses.   Advised 1 week follow up per pt/inr notes. This is also a difficult situation as patient has been accepted by Dr. Kirtland BouchardK but is still uncertain who he wants to follow up with. At this point, benefits of coumadin for individualized patient may be lower than risks. Follow up 1 week.     Rash 1 week follow up-resolved.   Return precautions advised. 1 week planned  INR 1.1

## 2014-05-20 ENCOUNTER — Ambulatory Visit (INDEPENDENT_AMBULATORY_CARE_PROVIDER_SITE_OTHER): Payer: Medicare Other | Admitting: General Practice

## 2014-05-20 DIAGNOSIS — Z5181 Encounter for therapeutic drug level monitoring: Secondary | ICD-10-CM | POA: Diagnosis not present

## 2014-05-20 DIAGNOSIS — I4891 Unspecified atrial fibrillation: Secondary | ICD-10-CM

## 2014-05-20 LAB — POCT INR: INR: 1.3

## 2014-05-20 NOTE — Progress Notes (Signed)
Pre visit review using our clinic review tool, if applicable. No additional management support is needed unless otherwise documented below in the visit note. 

## 2014-05-30 ENCOUNTER — Ambulatory Visit: Payer: Medicare Other

## 2014-06-17 ENCOUNTER — Ambulatory Visit: Payer: Medicare Other

## 2014-06-20 ENCOUNTER — Ambulatory Visit (INDEPENDENT_AMBULATORY_CARE_PROVIDER_SITE_OTHER): Payer: Medicare Other | Admitting: General Practice

## 2014-06-20 DIAGNOSIS — Z5181 Encounter for therapeutic drug level monitoring: Secondary | ICD-10-CM

## 2014-06-20 DIAGNOSIS — I4891 Unspecified atrial fibrillation: Secondary | ICD-10-CM

## 2014-06-20 LAB — POCT INR: INR: 1.7

## 2014-06-20 NOTE — Progress Notes (Signed)
Pre visit review using our clinic review tool, if applicable. No additional management support is needed unless otherwise documented below in the visit note. 

## 2014-07-05 ENCOUNTER — Emergency Department (HOSPITAL_COMMUNITY): Payer: Medicare Other

## 2014-07-05 ENCOUNTER — Encounter (HOSPITAL_COMMUNITY): Payer: Self-pay | Admitting: Emergency Medicine

## 2014-07-05 ENCOUNTER — Emergency Department (HOSPITAL_COMMUNITY)
Admission: EM | Admit: 2014-07-05 | Discharge: 2014-07-05 | Disposition: A | Discharge status: Home / Self Care | Payer: Medicare Other | Attending: Emergency Medicine | Admitting: Emergency Medicine

## 2014-07-05 DIAGNOSIS — Z86010 Personal history of colonic polyps: Secondary | ICD-10-CM | POA: Diagnosis not present

## 2014-07-05 DIAGNOSIS — Y92410 Unspecified street and highway as the place of occurrence of the external cause: Secondary | ICD-10-CM | POA: Diagnosis not present

## 2014-07-05 DIAGNOSIS — Y9389 Activity, other specified: Secondary | ICD-10-CM | POA: Insufficient documentation

## 2014-07-05 DIAGNOSIS — Z87448 Personal history of other diseases of urinary system: Secondary | ICD-10-CM | POA: Insufficient documentation

## 2014-07-05 DIAGNOSIS — Z7901 Long term (current) use of anticoagulants: Secondary | ICD-10-CM | POA: Diagnosis not present

## 2014-07-05 DIAGNOSIS — Z79899 Other long term (current) drug therapy: Secondary | ICD-10-CM | POA: Diagnosis not present

## 2014-07-05 DIAGNOSIS — E785 Hyperlipidemia, unspecified: Secondary | ICD-10-CM | POA: Diagnosis not present

## 2014-07-05 DIAGNOSIS — Y998 Other external cause status: Secondary | ICD-10-CM | POA: Insufficient documentation

## 2014-07-05 DIAGNOSIS — Z87891 Personal history of nicotine dependence: Secondary | ICD-10-CM | POA: Insufficient documentation

## 2014-07-05 DIAGNOSIS — Z8719 Personal history of other diseases of the digestive system: Secondary | ICD-10-CM | POA: Insufficient documentation

## 2014-07-05 DIAGNOSIS — Z8673 Personal history of transient ischemic attack (TIA), and cerebral infarction without residual deficits: Secondary | ICD-10-CM | POA: Insufficient documentation

## 2014-07-05 DIAGNOSIS — M109 Gout, unspecified: Secondary | ICD-10-CM | POA: Diagnosis not present

## 2014-07-05 DIAGNOSIS — I10 Essential (primary) hypertension: Secondary | ICD-10-CM | POA: Diagnosis not present

## 2014-07-05 DIAGNOSIS — S82002A Unspecified fracture of left patella, initial encounter for closed fracture: Secondary | ICD-10-CM | POA: Diagnosis not present

## 2014-07-05 DIAGNOSIS — M25562 Pain in left knee: Secondary | ICD-10-CM | POA: Diagnosis present

## 2014-07-05 LAB — I-STAT CHEM 8, ED
BUN: 21 mg/dL (ref 6–23)
CALCIUM ION: 1.16 mmol/L (ref 1.13–1.30)
CHLORIDE: 105 mmol/L (ref 96–112)
CREATININE: 1.6 mg/dL — AB (ref 0.50–1.35)
Glucose, Bld: 171 mg/dL — ABNORMAL HIGH (ref 70–99)
HEMATOCRIT: 38 % — AB (ref 39.0–52.0)
HEMOGLOBIN: 12.9 g/dL — AB (ref 13.0–17.0)
Potassium: 3.3 mmol/L — ABNORMAL LOW (ref 3.5–5.1)
SODIUM: 139 mmol/L (ref 135–145)
TCO2: 17 mmol/L (ref 0–100)

## 2014-07-05 LAB — PROTIME-INR
INR: 1.85 — ABNORMAL HIGH (ref 0.00–1.49)
Prothrombin Time: 21.5 seconds — ABNORMAL HIGH (ref 11.6–15.2)

## 2014-07-05 MED ORDER — OXYCODONE-ACETAMINOPHEN 5-325 MG PO TABS: 1.0000 | ORAL_TABLET | ORAL | Status: DC | PRN

## 2014-07-05 MED ORDER — FENTANYL CITRATE (PF) 100 MCG/2ML IJ SOLN
50.0000 ug | Freq: Once | INTRAMUSCULAR | Status: AC
  Administered 2014-07-05: 50 ug via INTRAVENOUS
  Filled 2014-07-05: qty 2

## 2014-07-05 MED ORDER — FENTANYL CITRATE (PF) 100 MCG/2ML IJ SOLN
100.0000 ug | Freq: Once | INTRAMUSCULAR | Status: AC
  Administered 2014-07-05: 100 ug via INTRAVENOUS
  Filled 2014-07-05: qty 2

## 2014-07-05 MED ORDER — TRAMADOL HCL 50 MG PO TABS: 50.0000 mg | ORAL_TABLET | Freq: Four times a day (QID) | ORAL | Status: DC | PRN

## 2014-07-05 MED ORDER — IOHEXOL 300 MG/ML  SOLN
80.0000 mL | Freq: Once | INTRAMUSCULAR | Status: AC | PRN
  Administered 2014-07-05: 80 mL via INTRAVENOUS
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
## 2014-07-05 NOTE — ED Notes (Signed)
This RN spoke with Ortho Tech.  Tech coming down to brace the left knee and show patient how to use crutches.

## 2014-07-05 NOTE — ED Provider Notes (Signed)
Dr [name] recommends ace wrap, immobilizer, crutches and see on monday  Daniel Rhineonald Milany Geck, MD 07/05/14 1656
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
## 2014-07-05 NOTE — ED Provider Notes (Signed)
Pt can ambulate with crutches He was given tramadol and percocet, and advised he can use either one and advised to ice frequently   Zadie Rhineonald Joli Koob, MD 07/05/14 [phone]

## 2014-07-05 NOTE — ED Provider Notes (Signed)
Plan is to f/u on CT imaging and discuss with ortho dr [name] concerning patella fracture   Daniel Rhineonald Aariz Maish, MD 07/05/14 1550

## 2014-07-05 NOTE — ED Notes (Signed)
All ortho supplies at bedside.

## 2014-07-05 NOTE — ED Notes (Signed)
Pt placed on heart monitor. axxo x4.

## 2014-07-05 NOTE — ED Provider Notes (Signed)
Pt stable Other than left knee film, no acute traumatic injury He denies neck pain He is awake/alert No focal abd tenderness Awaiting ortho consult   Zadie Rhineonald Sanchez Hemmer, MD 07/05/14 1652

## 2014-07-05 NOTE — ED Notes (Signed)
Patient transported to CT 
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
## 2014-07-10 ENCOUNTER — Telehealth: Payer: Self-pay | Admitting: General Practice

## 2014-07-10 ENCOUNTER — Telehealth: Payer: Self-pay | Admitting: Family Medicine
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
## 2014-07-10 NOTE — Telephone Encounter (Signed)
-----   Message from Shelva MajesticStephen O Hunter, MD sent at 07/10/2014 11:26 AM EDT ----- Regarding: RE: pending surgery/Lovenox bridge Given a fib and no history of stroke, I am ok with stopping coumadin 5 days prior and not bridging. There obviously is some stroke risk but I think likely low.  Tana Conch-Stephen Hunter ----- Message -----    From: Garrison Columbusynthia D Tylik Treese, RN    Sent: 07/10/2014   9:21 AM      To: Shelva MajesticStephen O Hunter, MD Subject: pending surgery/Lovenox bridge                 Hi Dr. Durene CalHunter,  Patient has pending patella surgery on Tuesday, 4/26.   [facility].  Also would you want patient bridged with Lovenox?    Thanks, Bailey Mechindy Monico Sudduth

## 2014-07-10 NOTE — Telephone Encounter (Signed)
[facility] for holding [facility] post surgery [facility]

## 2014-07-10 NOTE — Telephone Encounter (Signed)
He should be bridged due to history CVA due to a fib as well as PE history.

## 2014-07-10 NOTE — Telephone Encounter (Signed)
See below

## 2014-07-11 ENCOUNTER — Telehealth: Payer: Self-pay

## 2014-07-11 ENCOUNTER — Other Ambulatory Visit: Payer: Self-pay | Admitting: Orthopedic Surgery

## 2014-07-11 NOTE — Telephone Encounter (Signed)
Paper signed and [facility] to [facility].

## 2014-07-11 NOTE — Telephone Encounter (Signed)
I misread on 2nd note and stated patient had history PE but this was pulmonary nodule.   Patient does have a history CVA due to a fib 2008 or before. He is aware there is risk of stroke if he comes off coumadin. I advised he take lovenox to lower this risk but he is not interested in injectable medication and declines. He wants to proceed forward with surgery. He declines coming in for further evaluation and preoperative clearance. He understands this could lead to stroke or death.   He states at baseline he can walk up an incline without shortness of breath. Last lipids were at goal and last BP was controlled. Given he understands CVA risk, reasonable to proceed forward with surgery and would consider medically maximized except for CVA risk.

## 2014-07-11 NOTE — Telephone Encounter (Signed)
Mrs. Daniel MaxwellCheryl please schedule Daniel [name]. Daniel [name] to come in tomorrow for surgery clearance and to discuss Lovenox.

## 2014-07-12 ENCOUNTER — Encounter (HOSPITAL_BASED_OUTPATIENT_CLINIC_OR_DEPARTMENT_OTHER): Payer: Self-pay | Admitting: *Deleted

## 2014-07-12 ENCOUNTER — Telehealth: Payer: Self-pay | Admitting: Family Medicine

## 2014-07-12 NOTE — Progress Notes (Signed)
Pt informed that Dr. Gentry RochJudd recommends that he follow his dr's plan of care for lovenox bridge between stopping and restarting  Coumadin for surgery. However, surgery can proceed since he is aware of the risks of another stroke or even death. Pt states he will call Dr. Durene CalHunter today.

## 2014-07-12 NOTE — Telephone Encounter (Signed)
Thank you :)

## 2014-07-12 NOTE — Telephone Encounter (Signed)
Patient would like to talk to Dr. Durene CalHunter regarding medication he is suppose to take prior to his leg surgery.

## 2014-07-12 NOTE — Telephone Encounter (Signed)
You can schedule him for a visit for 4 pm if he wants to speak or he can see me on Monday or Tuesday. We already spent a significant amount of time trying to address his concerns by phone and he needs to be seen for further counseling

## 2014-07-12 NOTE — Telephone Encounter (Signed)
Called pt bck to try and get more info and mentioned that you spoke with him yesterday regarding this and another reason we wanted him to come in, pt did not want to give me any information he only wants to speak with you since you personally spoke with him yesterday

## 2014-07-12 NOTE — Progress Notes (Signed)
Pt instructed npo pmn 2/25. Needs istat on arrival.  ekg ,cxr in epic.pt using walker or crutches

## 2014-07-12 NOTE — Telephone Encounter (Signed)
Daniel [name] see Dr. Durene CalHunter message below and see if pt will come in to discuss since Dr. Durene CalHunter already spoke with him personally over the phone yesterday.

## 2014-07-12 NOTE — Progress Notes (Signed)
Note from Dr. Tana ConchStephen Hunter shared w Dr. Gentry RochJudd. Pt unwilling to use lovenox bridge after stoppong coumadin and restarting. Pt aware of risk per Dr. Durene CalHunter.  Dr. Gentry RochJudd to review pt hx and give further instructions.
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
## 2014-07-16 ENCOUNTER — Ambulatory Visit (HOSPITAL_BASED_OUTPATIENT_CLINIC_OR_DEPARTMENT_OTHER): Payer: Medicare Other | Admitting: Anesthesiology

## 2014-07-16 ENCOUNTER — Observation Stay (HOSPITAL_BASED_OUTPATIENT_CLINIC_OR_DEPARTMENT_OTHER)
Admission: RE | Admit: 2014-07-16 | Discharge: 2014-07-17 | Disposition: A | Discharge status: Home / Self Care | Payer: Medicare Other | Source: Ambulatory Visit | Attending: Specialist | Admitting: Specialist

## 2014-07-16 ENCOUNTER — Encounter (HOSPITAL_COMMUNITY)
Admission: RE | Disposition: A | Discharge status: Home / Self Care | Payer: Self-pay | Source: Ambulatory Visit | Attending: Specialist

## 2014-07-16 ENCOUNTER — Encounter (HOSPITAL_BASED_OUTPATIENT_CLINIC_OR_DEPARTMENT_OTHER): Payer: Self-pay | Admitting: *Deleted

## 2014-07-16 DIAGNOSIS — S82042A Displaced comminuted fracture of left patella, initial encounter for closed fracture: Principal | ICD-10-CM | POA: Insufficient documentation

## 2014-07-16 DIAGNOSIS — D649 Anemia, unspecified: Secondary | ICD-10-CM | POA: Insufficient documentation

## 2014-07-16 DIAGNOSIS — Z86010 Personal history of colonic polyps: Secondary | ICD-10-CM | POA: Insufficient documentation

## 2014-07-16 DIAGNOSIS — I10 Essential (primary) hypertension: Secondary | ICD-10-CM | POA: Insufficient documentation

## 2014-07-16 DIAGNOSIS — Z7901 Long term (current) use of anticoagulants: Secondary | ICD-10-CM | POA: Diagnosis not present

## 2014-07-16 DIAGNOSIS — E785 Hyperlipidemia, unspecified: Secondary | ICD-10-CM | POA: Insufficient documentation

## 2014-07-16 DIAGNOSIS — Z8711 Personal history of peptic ulcer disease: Secondary | ICD-10-CM | POA: Diagnosis not present

## 2014-07-16 DIAGNOSIS — Z9889 Other specified postprocedural states: Secondary | ICD-10-CM

## 2014-07-16 DIAGNOSIS — M1990 Unspecified osteoarthritis, unspecified site: Secondary | ICD-10-CM | POA: Insufficient documentation

## 2014-07-16 DIAGNOSIS — Z87891 Personal history of nicotine dependence: Secondary | ICD-10-CM | POA: Insufficient documentation

## 2014-07-16 DIAGNOSIS — W19XXXA Unspecified fall, initial encounter: Secondary | ICD-10-CM | POA: Diagnosis not present

## 2014-07-16 DIAGNOSIS — M109 Gout, unspecified: Secondary | ICD-10-CM | POA: Diagnosis not present

## 2014-07-16 DIAGNOSIS — I4891 Unspecified atrial fibrillation: Secondary | ICD-10-CM | POA: Insufficient documentation

## 2014-07-16 DIAGNOSIS — Z8673 Personal history of transient ischemic attack (TIA), and cerebral infarction without residual deficits: Secondary | ICD-10-CM | POA: Insufficient documentation

## 2014-07-16 HISTORY — DX: Unspecified glaucoma: H40.9

## 2014-07-16 HISTORY — DX: Unspecified fracture of unspecified patella, initial encounter for closed fracture: S82.009A

## 2014-07-16 HISTORY — DX: Personal history of other specified conditions: Z87.898

## 2014-07-16 HISTORY — DX: Presence of dental prosthetic device (complete) (partial): Z97.2

## 2014-07-16 HISTORY — DX: Cardiac arrhythmia, unspecified: I49.9

## 2014-07-16 HISTORY — DX: Unspecified osteoarthritis, unspecified site: M19.90

## 2014-07-16 HISTORY — PX: KNEE ARTHROSCOPY WITH PATELLA RECONSTRUCTION: SHX5654

## 2014-07-16 HISTORY — DX: Solitary pulmonary nodule: R91.1

## 2014-07-16 LAB — POCT I-STAT, CHEM 8
BUN: 26 mg/dL — ABNORMAL HIGH (ref 6–23)
CALCIUM ION: 1.23 mmol/L (ref 1.13–1.30)
Chloride: 104 mmol/L (ref 96–112)
Creatinine, Ser: 1.7 mg/dL — ABNORMAL HIGH (ref 0.50–1.35)
Glucose, Bld: 113 mg/dL — ABNORMAL HIGH (ref 70–99)
HCT: 38 % — ABNORMAL LOW (ref 39.0–52.0)
Hemoglobin: 12.9 g/dL — ABNORMAL LOW (ref 13.0–17.0)
Potassium: 4 mmol/L (ref 3.5–5.1)
Sodium: 140 mmol/L (ref 135–145)
TCO2: 21 mmol/L (ref 0–100)

## 2014-07-16 LAB — PROTIME-INR
INR: 1.17 (ref 0.00–1.49)
PROTHROMBIN TIME: 15.1 s (ref 11.6–15.2)

## 2014-07-16 LAB — APTT: aPTT: 32 seconds (ref 24–37)

## 2014-07-16 SURGERY — ARTHROSCOPY, KNEE, WITH PATELLAR RECONSTRUCTION
Anesthesia: Regional | Site: Knee | Laterality: Left

## 2014-07-16 MED ORDER — METHOCARBAMOL 1000 MG/10ML IJ SOLN
500.0000 mg | Freq: Four times a day (QID) | INTRAVENOUS | Status: DC | PRN
  Filled 2014-07-16: qty 5

## 2014-07-16 MED ORDER — AMLODIPINE BESYLATE 5 MG PO TABS
5.0000 mg | ORAL_TABLET | Freq: Every day | ORAL | Status: DC
  Administered 2014-07-16: 5 mg via ORAL
  Filled 2014-07-16 (×2): qty 1

## 2014-07-16 MED ORDER — DOCUSATE SODIUM 100 MG PO CAPS
100.0000 mg | ORAL_CAPSULE | Freq: Two times a day (BID) | ORAL | Status: DC
  Administered 2014-07-16: 100 mg via ORAL

## 2014-07-16 MED ORDER — METOCLOPRAMIDE HCL 10 MG PO TABS: 5.0000 mg | ORAL_TABLET | Freq: Three times a day (TID) | ORAL | Status: DC | PRN

## 2014-07-16 MED ORDER — ACETAMINOPHEN 500 MG PO TABS
1000.0000 mg | ORAL_TABLET | Freq: Four times a day (QID) | ORAL | Status: DC
  Administered 2014-07-17 (×3): 1000 mg via ORAL
  Filled 2014-07-16 (×4): qty 2

## 2014-07-16 MED ORDER — ACETAMINOPHEN 325 MG PO TABS: 650.0000 mg | ORAL_TABLET | Freq: Four times a day (QID) | ORAL | Status: DC | PRN

## 2014-07-16 MED ORDER — METHOCARBAMOL 500 MG PO TABS
500.0000 mg | ORAL_TABLET | Freq: Four times a day (QID) | ORAL | Status: DC | PRN
Start: 2014-07-16 — End: 2014-07-17
  Administered 2014-07-17: 500 mg via ORAL
  Filled 2014-07-16: qty 1

## 2014-07-16 MED ORDER — CEFAZOLIN SODIUM-DEXTROSE 2-3 GM-% IV SOLR
2.0000 g | INTRAVENOUS | Status: DC
  Filled 2014-07-16: qty 50

## 2014-07-16 MED ORDER — ACETAMINOPHEN 10 MG/ML IV SOLN
INTRAVENOUS | Status: DC | PRN
  Administered 2014-07-16: 1000 mg via INTRAVENOUS

## 2014-07-16 MED ORDER — ONDANSETRON HCL 4 MG PO TABS: 4.0000 mg | ORAL_TABLET | Freq: Four times a day (QID) | ORAL | Status: DC | PRN

## 2014-07-16 MED ORDER — ONDANSETRON HCL 4 MG/2ML IJ SOLN
4.0000 mg | Freq: Once | INTRAMUSCULAR | Status: DC | PRN
Start: 2014-07-16 — End: 2014-07-16
  Filled 2014-07-16: qty 2

## 2014-07-16 MED ORDER — BUPIVACAINE HCL (PF) 0.25 % IJ SOLN
INTRAMUSCULAR | Status: DC | PRN
  Administered 2014-07-16: 10 mL

## 2014-07-16 MED ORDER — ONDANSETRON HCL 4 MG/2ML IJ SOLN: 4.0000 mg | Freq: Four times a day (QID) | INTRAMUSCULAR | Status: DC | PRN

## 2014-07-16 MED ORDER — LACTATED RINGERS IV SOLN
INTRAVENOUS | Status: DC
  Administered 2014-07-16 (×3): via INTRAVENOUS
  Filled 2014-07-16: qty 1000

## 2014-07-16 MED ORDER — FENTANYL CITRATE (PF) 100 MCG/2ML IJ SOLN
INTRAMUSCULAR | Status: AC
  Filled 2014-07-16: qty 2

## 2014-07-16 MED ORDER — MIDAZOLAM HCL 2 MG/2ML IJ SOLN
2.0000 mg | Freq: Once | INTRAMUSCULAR | Status: AC
  Administered 2014-07-16: 1 mg via INTRAVENOUS
  Filled 2014-07-16: qty 2

## 2014-07-16 MED ORDER — OXYCODONE-ACETAMINOPHEN 5-325 MG PO TABS
1.0000 | ORAL_TABLET | ORAL | Status: DC | PRN
  Administered 2014-07-16 – 2014-07-17 (×2): 1 via ORAL
  Filled 2014-07-16 (×2): qty 1

## 2014-07-16 MED ORDER — WARFARIN - PHYSICIAN DOSING INPATIENT: Freq: Every day | Status: DC

## 2014-07-16 MED ORDER — SODIUM CHLORIDE 0.9 % IR SOLN
Status: DC | PRN
  Administered 2014-07-16: 500 mL

## 2014-07-16 MED ORDER — CEFAZOLIN SODIUM-DEXTROSE 2-3 GM-% IV SOLR: 2.0000 g | Freq: Once | INTRAVENOUS | Status: DC

## 2014-07-16 MED ORDER — PROPOFOL 10 MG/ML IV BOLUS
INTRAVENOUS | Status: DC | PRN
  Administered 2014-07-16: 150 mg via INTRAVENOUS

## 2014-07-16 MED ORDER — FENTANYL CITRATE (PF) 100 MCG/2ML IJ SOLN
100.0000 ug | Freq: Once | INTRAMUSCULAR | Status: AC
  Administered 2014-07-16: 100 ug via INTRAVENOUS
  Filled 2014-07-16: qty 2

## 2014-07-16 MED ORDER — ATORVASTATIN CALCIUM 20 MG PO TABS
20.0000 mg | ORAL_TABLET | Freq: Every day | ORAL | Status: DC
  Administered 2014-07-16: 20 mg via ORAL
  Filled 2014-07-16 (×2): qty 1

## 2014-07-16 MED ORDER — ONDANSETRON HCL 4 MG/2ML IJ SOLN
INTRAMUSCULAR | Status: DC | PRN
  Administered 2014-07-16: 4 mg via INTRAVENOUS

## 2014-07-16 MED ORDER — EPHEDRINE SULFATE 50 MG/ML IJ SOLN
INTRAMUSCULAR | Status: DC | PRN
  Administered 2014-07-16: 15 mg via INTRAVENOUS
  Administered 2014-07-16: 10 mg via INTRAVENOUS

## 2014-07-16 MED ORDER — HYDROMORPHONE HCL 1 MG/ML IJ SOLN
0.5000 mg | INTRAMUSCULAR | Status: DC | PRN
Start: 2014-07-16 — End: 2014-07-17

## 2014-07-16 MED ORDER — METOCLOPRAMIDE HCL 5 MG/ML IJ SOLN: 5.0000 mg | Freq: Three times a day (TID) | INTRAMUSCULAR | Status: DC | PRN

## 2014-07-16 MED ORDER — DIPHENHYDRAMINE HCL 12.5 MG/5ML PO ELIX: 12.5000 mg | ORAL_SOLUTION | ORAL | Status: DC | PRN

## 2014-07-16 MED ORDER — CEFAZOLIN SODIUM-DEXTROSE 2-3 GM-% IV SOLR
INTRAVENOUS | Status: AC
  Filled 2014-07-16: qty 50

## 2014-07-16 MED ORDER — MIDAZOLAM HCL 2 MG/2ML IJ SOLN
INTRAMUSCULAR | Status: AC
  Filled 2014-07-16: qty 2

## 2014-07-16 MED ORDER — SODIUM CHLORIDE 0.9 % IV SOLN
INTRAVENOUS | Status: DC
  Administered 2014-07-16: 21:00:00 via INTRAVENOUS

## 2014-07-16 MED ORDER — FENTANYL CITRATE (PF) 100 MCG/2ML IJ SOLN
25.0000 ug | INTRAMUSCULAR | Status: DC | PRN
  Administered 2014-07-16 (×2): 25 ug via INTRAVENOUS
  Filled 2014-07-16: qty 1

## 2014-07-16 MED ORDER — TAMSULOSIN HCL 0.4 MG PO CAPS
0.4000 mg | ORAL_CAPSULE | Freq: Every day | ORAL | Status: DC
  Administered 2014-07-16: 0.4 mg via ORAL
  Filled 2014-07-16 (×3): qty 1

## 2014-07-16 MED ORDER — LIDOCAINE HCL (CARDIAC) 20 MG/ML IV SOLN
INTRAVENOUS | Status: DC | PRN
  Administered 2014-07-16: 80 mg via INTRAVENOUS

## 2014-07-16 MED ORDER — ROPIVACAINE HCL 5 MG/ML IJ SOLN
INTRAMUSCULAR | Status: DC | PRN
  Administered 2014-07-16: 30 mL via PERINEURAL

## 2014-07-16 MED ORDER — CEFAZOLIN SODIUM 1-5 GM-% IV SOLN
1.0000 g | Freq: Four times a day (QID) | INTRAVENOUS | Status: AC
  Administered 2014-07-17 (×3): 1 g via INTRAVENOUS
  Filled 2014-07-16 (×3): qty 50

## 2014-07-16 MED ORDER — ACETAMINOPHEN 650 MG RE SUPP: 650.0000 mg | Freq: Four times a day (QID) | RECTAL | Status: DC | PRN

## 2014-07-16 MED ORDER — POVIDONE-IODINE 7.5 % EX SOLN
Freq: Once | CUTANEOUS | Status: DC
  Filled 2014-07-16: qty 118

## 2014-07-16 MED ORDER — WARFARIN SODIUM 2.5 MG PO TABS
2.5000 mg | ORAL_TABLET | Freq: Once | ORAL | Status: DC
  Filled 2014-07-16: qty 1

## 2014-07-16 MED ORDER — FENTANYL CITRATE (PF) 100 MCG/2ML IJ SOLN
INTRAMUSCULAR | Status: DC | PRN
  Administered 2014-07-16 (×2): 50 ug via INTRAVENOUS

## 2014-07-16 MED ORDER — ALLOPURINOL 300 MG PO TABS
300.0000 mg | ORAL_TABLET | Freq: Every day | ORAL | Status: DC
  Administered 2014-07-16: 300 mg via ORAL
  Filled 2014-07-16 (×2): qty 1

## 2014-07-16 MED ORDER — FENTANYL CITRATE (PF) 100 MCG/2ML IJ SOLN
INTRAMUSCULAR | Status: AC
  Filled 2014-07-16: qty 4

## 2014-07-16 SURGICAL SUPPLY — 66 items
2.7 DRILL BIT ×2 IMPLANT
3.2 DRILL BIT ×1 IMPLANT
BANDAGE ELASTIC 4 VELCRO ST LF (GAUZE/BANDAGES/DRESSINGS) ×3 IMPLANT
BANDAGE ELASTIC 6 VELCRO ST LF (GAUZE/BANDAGES/DRESSINGS) ×3 IMPLANT
BANDAGE ESMARK 6X9 LF (GAUZE/BANDAGES/DRESSINGS) IMPLANT
BIT DRILL 3.2X128 (BIT) ×1 IMPLANT
BIT DRILL 3.2X128MM (BIT) ×1
BLADE SURG 10 STRL SS (BLADE) ×3 IMPLANT
BLADE SURG 15 STRL LF DISP TIS (BLADE) ×1 IMPLANT
BLADE SURG 15 STRL SS (BLADE) ×6
BNDG CMPR 9X6 STRL LF SNTH (GAUZE/BANDAGES/DRESSINGS) ×1
BNDG COHESIVE 4X5 TAN NS LF (GAUZE/BANDAGES/DRESSINGS) ×2 IMPLANT
BNDG ESMARK 6X9 LF (GAUZE/BANDAGES/DRESSINGS) ×3
BNDG GAUZE ELAST 4 BULKY (GAUZE/BANDAGES/DRESSINGS) ×3 IMPLANT
CLOSURE WOUND 1/2 X4 (GAUZE/BANDAGES/DRESSINGS) ×1
COVER MAYO STAND STRL (DRAPES) ×3 IMPLANT
COVER TABLE BACK 60X90 (DRAPES) ×2 IMPLANT
CUFF TOURN SGL QUICK 34 (TOURNIQUET CUFF) ×3
CUFF TRNQT CYL 34X4X40X1 (TOURNIQUET CUFF) IMPLANT
DRAPE INCISE IOBAN 66X45 STRL (DRAPES) ×3 IMPLANT
DRAPE OEC MINIVIEW 54X84 (DRAPES) ×2 IMPLANT
DRAPE ORTHO SPLIT 77X108 STRL (DRAPES) ×6
DRAPE SURG ORHT 6 SPLT 77X108 (DRAPES) IMPLANT
DRAPE U-SHAPE 47X51 STRL (DRAPES) ×3 IMPLANT
DRSG PAD ABDOMINAL 8X10 ST (GAUZE/BANDAGES/DRESSINGS) ×2 IMPLANT
ELECT REM PT RETURN 9FT ADLT (ELECTROSURGICAL) ×3
ELECTRODE REM PT RTRN 9FT ADLT (ELECTROSURGICAL) ×1 IMPLANT
FIBERSTICK 2 (SUTURE) ×4 IMPLANT
GAUZE XEROFORM 1X8 LF (GAUZE/BANDAGES/DRESSINGS) ×2 IMPLANT
GLOVE BIO SURGEON STRL SZ8.5 (GLOVE) ×5 IMPLANT
GLOVE BIOGEL PI IND STRL 7.5 (GLOVE) IMPLANT
GLOVE BIOGEL PI IND STRL 8 (GLOVE) ×1 IMPLANT
GLOVE BIOGEL PI IND STRL 8.5 (GLOVE) ×1 IMPLANT
GLOVE BIOGEL PI INDICATOR 7.5 (GLOVE) ×2
GLOVE BIOGEL PI INDICATOR 8 (GLOVE) ×2
GLOVE BIOGEL PI INDICATOR 8.5 (GLOVE) ×2
GLOVE SURG SS PI 7.5 STRL IVOR (GLOVE) ×2 IMPLANT
GOWN STRL REUS W/ TWL LRG LVL3 (GOWN DISPOSABLE) ×4 IMPLANT
GOWN STRL REUS W/TWL LRG LVL3 (GOWN DISPOSABLE) ×9
IMMOBILIZER KNEE 22 UNIV (SOFTGOODS) ×2 IMPLANT
NDL 1/2 CIR CATGUT .05X1.09 (NEEDLE) IMPLANT
NEEDLE 1/2 CIR CATGUT .05X1.09 (NEEDLE) ×3 IMPLANT
NEEDLE HYPO 22GX1.5 SAFETY (NEEDLE) ×2 IMPLANT
NS IRRIG 500ML POUR BTL (IV SOLUTION) ×3 IMPLANT
PAD ABD 8X10 STRL (GAUZE/BANDAGES/DRESSINGS) ×6 IMPLANT
PAD CAST 4YDX4 CTTN HI CHSV (CAST SUPPLIES) ×1 IMPLANT
PADDING CAST COTTON 4X4 STRL (CAST SUPPLIES)
PADDING CAST COTTON 6X4 STRL (CAST SUPPLIES) ×1 IMPLANT
PASSER SUT SWANSON 36MM LOOP (INSTRUMENTS) ×2 IMPLANT
PENCIL BUTTON HOLSTER BLD 10FT (ELECTRODE) ×2 IMPLANT
SPONGE GAUZE 4X4 12PLY STER LF (GAUZE/BANDAGES/DRESSINGS) ×3 IMPLANT
SPONGE LAP 18X18 X RAY DECT (DISPOSABLE) ×3 IMPLANT
STOCKINETTE IMPERVIOUS LG (DRAPES) ×3 IMPLANT
STRIP CLOSURE SKIN 1/2X4 (GAUZE/BANDAGES/DRESSINGS) ×1 IMPLANT
SUT FIBERWIRE #2 38 T-5 BLUE (SUTURE) ×3
SUT MNCRL AB 4-0 PS2 18 (SUTURE) ×2 IMPLANT
SUT VIC AB 0 CT1 36 (SUTURE) ×2 IMPLANT
SUT VIC AB 2-0 CT1 27 (SUTURE) ×3
SUT VIC AB 2-0 CT1 TAPERPNT 27 (SUTURE) ×2 IMPLANT
SUTURE FIBERWR #2 38 T-5 BLUE (SUTURE) IMPLANT
SYR BULB IRRIGATION 50ML (SYRINGE) ×2 IMPLANT
SYR CONTROL 10ML LL (SYRINGE) ×2 IMPLANT
TOWEL OR 17X24 6PK STRL BLUE (TOWEL DISPOSABLE) ×5 IMPLANT
TUBE CONNECTING 12'X1/4 (SUCTIONS) ×1
TUBE CONNECTING 12X1/4 (SUCTIONS) ×2 IMPLANT
YANKAUER SUCT BULB TIP NO VENT (SUCTIONS) ×3 IMPLANT

## 2014-07-16 NOTE — Op Note (Signed)
Dictated [id number]

## 2014-07-16 NOTE — Progress Notes (Signed)
PHARMACIST - PHYSICIAN COMMUNICATION DR:  Thomasena Edisollins CONCERNING: Pharmacy Care Issues Regarding Warfarin Labs  RECOMMENDATION (Action Taken): A baseline and daily protime for three days has been ordered to meet the [facility] National Patient safety goal and comply with the current [facility] policy.   The Pharmacy will defer all warfarin dose order changes and follow up of lab results to the prescriber unless an additional order to initiate a "pharmacy Coumadin consult" is placed.  DESCRIPTION:  While hospitalized, to be in compliance with [employer] National Patient Safety Goals, all patients on warfarin must have a baseline and/or current protime prior to the administration of warfarin. Pharmacy has received your order for warfarin without these required laboratory assessments.  Thank you, Loralee PacasErin Azya Barbero, PharmD, BCPS 07/16/2014 9:03 PM

## 2014-07-16 NOTE — Anesthesia Procedure Notes (Addendum)
Anesthesia Regional Block:  Femoral nerve block  Pre-Anesthetic Checklist: ,, timeout performed, Correct Patient, Correct Site, Correct Laterality, Correct Procedure,, site marked, risks and benefits discussed, Surgical consent,  Pre-op evaluation,  At surgeon's request and post-op pain management  Laterality: Left  Prep: chloraprep       Needles:  Injection technique: Single-shot  Needle Type: Echogenic Stimulator Needle     Needle Length: 9cm 9 cm Needle Gauge: 21 and 21 G    Additional Needles:  Procedures: nerve stimulator Femoral nerve block  Nerve Stimulator or Paresthesia:  Response: Quadriceps muscle contraction, 0.45 mA,   Additional Responses:   Narrative:  Start time: 07/16/2014 2:00 PM End time: 07/16/2014 2:11 PM Injection made incrementally with aspirations every 5 mL.  Performed by: Personally  Anesthesiologist: HODIERNE, ADAM  Additional Notes: Functioning IV was confirmed and monitors were applied.  A 90mm 21ga Arrow echogenic stimulator needle was used. Sterile prep and drape,hand hygiene and sterile gloves were used.  Negative aspiration and negative test dose prior to incremental administration of local anesthetic. The patient tolerated the procedure well.     Procedure Name: LMA Insertion Date/Time: 07/16/2014 2:56 PM Performed by: Tyrone NineSAUVE, Riyanshi Wahab F Pre-anesthesia Checklist: Patient identified, Timeout performed, Emergency Drugs available, Suction available and Patient being monitored Patient Re-evaluated:Patient Re-evaluated prior to inductionOxygen Delivery Method: Circle system utilized Preoxygenation: Pre-oxygenation with 100% oxygen Intubation Type: IV induction Ventilation: Mask ventilation without difficulty LMA: LMA inserted LMA Size: 4.0 Number of attempts: 1 Placement Confirmation: positive ETCO2 and breath sounds checked- equal and bilateral Tube secured with: Tape Dental Injury: Teeth and Oropharynx as per pre-operative assessment
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
## 2014-07-16 NOTE — Anesthesia Postprocedure Evaluation (Signed)
  Anesthesia Post-op Note  Patient: Daniel [name]  Procedure(s) Performed: Procedure(s): LEFT KNEE PARTIAL PATELLECTOMY   (Left)  Patient Location: PACU  Anesthesia Type:General and block  Level of Consciousness: awake and alert   Airway and Oxygen Therapy: Patient Spontanous Breathing  Post-op Pain: mild  Post-op Assessment: Post-op Vital signs reviewed, Patient's Cardiovascular Status Stable and Respiratory Function Stable  Post-op Vital Signs: Reviewed  Filed Vitals:   07/16/14 1715  BP: 147/70  Pulse: 87  Temp:   Resp: 22    Complications: No apparent anesthesia complications

## 2014-07-16 NOTE — Transfer of Care (Signed)
Immediate Anesthesia Transfer of Care Note  Patient: Daniel [name]  Procedure(s) Performed: Procedure(s): LEFT KNEE PARTIAL PATELLECTOMY   (Left)  Patient Location: PACU  Anesthesia Type:General  Level of Consciousness: awake, alert , oriented and patient cooperative  Airway & Oxygen Therapy: Patient Spontanous Breathing and Patient connected to nasal cannula oxygen  Post-op Assessment: Report given to RN and Post -op Vital signs reviewed and stable  Post vital signs: Reviewed and stable  Last Vitals:  Filed Vitals:   07/16/14 1229  BP: 110/61  Pulse: 90  Temp: 36.7 C  Resp: 16    Complications: No apparent anesthesia complications
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
## 2014-07-17 ENCOUNTER — Encounter (HOSPITAL_BASED_OUTPATIENT_CLINIC_OR_DEPARTMENT_OTHER): Payer: Self-pay | Admitting: Specialist

## 2014-07-17 DIAGNOSIS — S82042A Displaced comminuted fracture of left patella, initial encounter for closed fracture: Secondary | ICD-10-CM | POA: Diagnosis not present

## 2014-07-17 LAB — PROTIME-INR
INR: 1.19 (ref 0.00–1.49)
Prothrombin Time: 15.3 seconds — ABNORMAL HIGH (ref 11.6–15.2)

## 2014-07-17 MED ORDER — CEFAZOLIN SODIUM-DEXTROSE 2-3 GM-% IV SOLR
INTRAVENOUS | Status: DC | PRN
  Administered 2014-07-16: 2 g via INTRAVENOUS

## 2014-07-17 MED ORDER — ACETAMINOPHEN 650 MG RE SUPP: 650.0000 mg | Freq: Four times a day (QID) | RECTAL | Status: DC | PRN

## 2014-07-17 MED ORDER — ACETAMINOPHEN 325 MG PO TABS: 650.0000 mg | ORAL_TABLET | Freq: Four times a day (QID) | ORAL | Status: DC | PRN

## 2014-07-17 NOTE — Evaluation (Addendum)
Physical Therapy Evaluation Patient Details Name: Daniel [name] MRN: [id number] DOB: 07/25/1932 Today's Date: 07/17/2014   History of Present Illness  s/p L patella comminuted fx, s/p Left knee anterior exploration, partial patellectomy, repair ext mechanism  Clinical Impression  Patient evaluated by Physical Therapy with no further acute PT needs identified. All education has been completed and the patient has no further questions. See below for any follow-up Physial Therapy or equipment needs. PT is signing off. Thank you for this referral.  Update: goals established and pt seen for second session at his request, he had further questions and needed additional review of precautions; see second not for details     Follow Up Recommendations Home health PT (pt may not want depending on cost)    Equipment Recommendations  Rolling walker with 5" wheels    Recommendations for Other Services       Precautions / Restrictions Precautions Precautions: Fall;Knee Precaution Comments: NO L knee flexion, pt educated on precautions, donning and doffing KI Required Braces or Orthoses: Knee Immobilizer - Left Knee Immobilizer - Left: On at all times Restrictions Weight Bearing Restrictions: Yes LLE Weight Bearing: Partial weight bearing LLE Partial Weight Bearing Percentage or Pounds: 50%      Mobility  Bed Mobility Overal bed mobility: Needs Assistance Bed Mobility: Supine to Sit     Supine to sit: Min guard        Transfers Overall transfer level: Needs assistance Equipment used: Rolling walker (2 wheeled) Transfers: Sit to/from Stand Sit to Stand: Min assist         General transfer comment: cues for hand placement and RLE position  Ambulation/Gait Ambulation/Gait assistance: Min guard;Supervision Ambulation Distance (Feet): 120 Feet Assistive device: Rolling walker (2 wheeled) Gait Pattern/deviations: Step-to pattern;Antalgic     General Gait Details: cues for  sequence, PWB  Stairs Stairs: Yes   Stair Management: One rail Right;Sideways Number of Stairs: 3 General stair comments: cues for sequence, PWB, safety  Wheelchair Mobility    Modified Rankin (Stroke Patients Only)       Balance Overall balance assessment: Needs assistance         Standing balance support: Single extremity supported;During functional activity;Bilateral upper extremity supported Standing balance-Leahy Scale: Poor                 High Level Balance Comments: pt requires at least unilateral UE support to maitain balance safely             Pertinent Vitals/Pain Pain Assessment: 0-10 Pain Score: 1  Pain Location: L knee Pain Descriptors / Indicators: Aching Pain Intervention(s): Limited activity within patient's tolerance;Monitored during session;Repositioned    Home Living Family/patient expects to be discharged to:: Private residence Living Arrangements: Spouse/significant other   Type of Home: House Home Access: Stairs to enter Entrance Stairs-Rails: Right;Left Entrance Stairs-Number of Steps: 4 Home Layout: One level Home Equipment: Walker - standard      Prior Function Level of Independence: Independent               Hand Dominance        Extremity/Trunk Assessment               Lower Extremity Assessment: LLE deficits/detail         Communication   Communication: No difficulties  Cognition Arousal/Alertness: Suspect due to medications (sleepy) Behavior During Therapy: WFL for tasks assessed/performed Overall Cognitive Status: Within Functional Limits for tasks assessed                        General Comments      Exercises Total Joint Exercises Ankle Circles/Pumps: AROM;Both;10 reps      Assessment/Plan    PT Assessment All further PT needs can be met in the next venue of care  PT Diagnosis Difficulty walking   PT Problem List    PT Treatment Interventions     PT Goals (Current goals  can be found in the Care Plan section) Acute Rehab PT Goals Patient Stated Goal: home today PT Goal Formulation: All assessment and education complete, DC therapy    Frequency     Barriers to discharge        Co-evaluation               End of Session Equipment Utilized During Treatment: Gait belt;Left knee immobilizer Activity Tolerance: Patient tolerated treatment well Patient left: in chair;with call bell/phone within reach Nurse Communication: Mobility status    Functional Assessment Tool Used: clinical judgement Functional Limitation: Mobility: Walking and moving around Mobility: Walking and Moving Around Current Status (H0623): At least 1 percent but less than 20 percent impaired, limited or restricted Mobility: Walking and Moving Around Goal Status [phone]): At least 1 percent but less than 20 percent impaired, limited or restricted M  Time: 0906-0940 PT Time Calculation (min) (ACUTE ONLY): 34 min   Charges:   PT Evaluation $Initial PT Evaluation Tier I: 1 Procedure PT Treatments $Gait Training: 8-22 mins   PT G Codes:   PT G-Codes **NOT FOR INPATIENT CLASS** Functional Assessment Tool Used: clinical judgement Functional Limitation: Mobility: Walking and moving around Mobility: Walking and Moving Around Current Status (T5176): At least 1 percent but less than 20 percent impaired, limited or restricted Mobility: Walking and Moving Around Goal Status [phone]): At least 1 percent but less than 20 percent impaired, limited or restricted     [facility] 07/17/2014, 11:24 AM

## 2014-07-17 NOTE — Care Management Note (Signed)
    Page 1 of 2   07/17/2014     12:31:36 PM CARE MANAGEMENT NOTE 07/17/2014  Patient:  Daniel [name], Daniel [name]   Account Number:  [id number]  Date Initiated:  07/17/2014  Documentation initiated by:  [facility]  Subjective/Objective Assessment:   adm: Left patella tendon fracture/S/P knee surgery     Action/Plan:   discharge planning   Anticipated DC Date:  07/17/2014   Anticipated DC Plan:  [place]  CM consult      [facility] Choice  HOME HEALTH   Choice offered to / List presented to:  C-1 Patient   DME arranged  [name]      DME agency  [place] arranged  [place].   Status of service:  Completed, signed off Medicare Important Message given?   (If response is "NO", the following Medicare IM given date fields will be blank) Date Medicare IM given:   Medicare IM given by:   Date Additional Medicare IM given:   Additional Medicare IM given by:    Discharge Disposition:  [place]  Per UR Regulation:    If discussed at Long Length of Stay Meetings, dates discussed:    Comments:  07/17/14 12:25 Cm met with pt in room to offer choice of home health agency.  Pt chooses [facility] to render HHPT. Address and contact [phone] verified by pt.  CM called [facility] DME rep, Lecretia to please deliver the rolling walker to room. Referral called to [facility] rep, Kristen.  No other CM needs were communicated.  Mariane Masters, BSN, CM [phone].

## 2014-07-17 NOTE — Discharge Summary (Signed)
Physician Discharge Summary  Patient ID: Daniel [name] MRN: [id number] DOB/AGE: 09-09-1932 [age] y.o.  Admit date: 07/16/2014 Discharge date: 07/17/2014  Admission Diagnoses: Left patella tendon fracture  Discharge Diagnoses:  Active Problems:   S/P knee surgery   Discharged Condition: good  Hospital Course:  Daniel [name] is a 81 y.o. who was admitted to [facility] for OBS. They were brought to the operating room on 07/16/2014 and underwent Procedure(s): LEFT KNEE PARTIAL PATELLECTOMY  .  Patient tolerated the procedure well and was later transferred to the recovery room and then to the orthopaedic floor for postoperative care.  They were given PO and IV analgesics for pain control following their surgery.  They were given 24 hours of postoperative antibiotics of  Anti-infectives    Start     Dose/Rate Route Frequency Ordered Stop   07/17/14 0600  ceFAZolin (ANCEF) IVPB 2 g/50 mL premix  Status:  Discontinued     2 g 100 mL/hr over 30 Minutes Intravenous On call to O.R. 07/16/14 1235 07/16/14 1702   07/17/14 0000  ceFAZolin (ANCEF) IVPB 1 g/50 mL premix     1 g 100 mL/hr over 30 Minutes Intravenous Every 6 hours 07/16/14 1800 07/17/14 1759   07/16/14 1500  ceFAZolin (ANCEF) IVPB 2 g/50 mL premix  Status:  Discontinued     2 g 100 mL/hr over 30 Minutes Intravenous  Once 07/16/14 1807 07/16/14 1809     and started on DVT prophylaxis in the form of coumandin.   PT and OT were ordered.  Discharge planning consulted to help with postop disposition and equipment needs.  Patient had a good night.  Dressing was WNL.  Patient was seen in rounds and was ready to go home.  Consults: N/a  Significant Diagnostic Studies: routine  Treatments: Routine  Discharge Exam: Blood pressure 113/61, pulse 71, temperature 97.7 F (36.5 C), temperature source Oral, resp. rate 16, height 5\' 6"  (1.676 m), weight 62.143 kg (137 lb), SpO2 98 %.   Sensation intact distally Intact pulses  distally Dorsiflexion/Plantar flexion intact splint dressing intact   Disposition: 01-Home or Self Care  Discharge Instructions    Call MD / Call 911    Complete by:  As directed   If you experience chest pain or shortness of breath, CALL 911 and be transported to the hospital emergency room.  If you develope a fever above 101 F, pus (white drainage) or increased drainage or redness at the wound, or calf pain, call your surgeon's office.     Constipation Prevention    Complete by:  As directed   Drink plenty of fluids.  Prune juice may be helpful.  You may use a stool softener, such as Colace (over the counter) 100 mg twice a day.  Use MiraLax (over the counter) for constipation as needed.     Diet - low sodium heart healthy    Complete by:  As directed      Discharge instructions    Complete by:  As directed   Follow up in the office with Dr. Thomasena Edisollins in 2 weeks call [phone] for appointment. Do not get dressing wet. May change dressing with gauze and tape in 3 days. Leave steri strips in place. Remain in brace.  Partial weight bearing to affected leg. Use assistive devices for safety.  F/u with Coumadin management team today via phone for appointment. Take medications as directed. D/c RX given to Pt. Follow instructions. Family monitoring and assitstance is recommended  for home.     Increase activity slowly as tolerated    Complete by:  As directed             Medication List    TAKE these medications        allopurinol 300 MG tablet  Commonly known as:  ZYLOPRIM  TAKE 1 TABLET BY MOUTH ONCE A DAY     amLODipine 5 MG tablet  Commonly known as:  NORVASC  Take 1 tablet (5 mg total) by mouth daily.     atorvastatin 20 MG tablet  Commonly known as:  LIPITOR  Take 1 tablet (20 mg total) by mouth daily at 6 PM.     brimonidine 0.2 % ophthalmic solution  Commonly known as:  ALPHAGAN  Place 1 drop into both eyes 2 (two) times daily.     oxyCODONE-acetaminophen 5-325 MG per tablet   Commonly known as:  PERCOCET/ROXICET  Take 1 tablet by mouth every 4 (four) hours as needed for severe pain.     tamsulosin 0.4 MG Caps capsule  Commonly known as:  FLOMAX  Take 1 capsule (0.4 mg total) by mouth daily after breakfast.     warfarin 2.5 MG tablet  Commonly known as:  COUMADIN  - One tablet 5 times per week  - 1 half tablet Monday and Thursday         Signed: Arsenio Loader L 07/17/2014, 9:12 AM

## 2014-07-17 NOTE — Addendum Note (Signed)
Addendum  created 07/17/14 1010 by Tyrone Nineobin F Tanaja Ganger, CRNA   Modules edited: Anesthesia Medication Administration

## 2014-07-17 NOTE — Op Note (Signed)
NAMMargaretmary Dys:  Klasen, Chloe                ACCOUNT NO.:  [id number]  MEDICAL RECORD NO.:  [id number]  LOCATION:  1611                         [facility]  PHYSICIAN:  Erasmo Leventhalobert Andrew Aman Batley, M.D.DATE OF BIRTH:  Jul 27, 1932  DATE OF PROCEDURE:  07/16/2014 DATE OF DISCHARGE:                              OPERATIVE REPORT   PREOPERATIVE DIAGNOSIS:  Left comminuted displaced patella fracture.  POSTOPERATIVE DIAGNOSIS:  Left comminuted displaced patella fracture.  PROCEDURE:  Left knee anterior exploration, partial patellectomy, repair of extensor mechanism.  SURGEON:  Erasmo Leventhalobert Andrew Sotirios Navarro, M.D.  ASSISTANT:  None.  ANESTHESIA:  Femoral nerve block with general.  ESTIMATED BLOOD LOSS:  Minimal.  DRAINS:  None.  COMPLICATIONS:  None.  TOURNIQUET TIME:  59 minutes at 300 mmHg.  DISPOSITION:  PACU, stable.  OPERATIVE DETAILS:  The patient was counseled in the holding area. Correct site was identified, marked, signed appropriately.  Femoral nerve block had been administered.  IV antibiotics were given on the way to the operating room.  In the OR, placed in a supine position under general anesthesia, placed on the OR bed.  Left foot was elevated. Prepped with DuraPrep and draped in sterile fashion.  Time-out was done, confirmed the left side.  Exsanguinated with Esmarch.  Tourniquet inflated to 220 mmHg.  A straight midline incision was made in the skin and subcutaneous tissue.  Subcutaneous tissue was identified, the retinaculum was opened longitudinally.  __________ trauma was encountered.  The distal patellar fragment was found to be basically nonarticular, comminuted, and did not feel feasibly retractable, it was excised.  The majority of the articular surface of the patella was intact.  The extensor mechanism __________ proximally and distally. Knee was opened, joints irrigated.  __________ hematoma was removed. Drill holes were placed into the patella.  Arthrex fiber sticks  were then placed through the drill holes into the tendon in a locking fashion.  Brought back up and tied and reinforced.  This repaired the extensor mechanism, patella, tendon back to the patella at the appropriate level, height, and tension.  This then reinforced the capsule medial and lateral.  Irrigated again.  Retinaculum closed with Vicryl, subcu with Vicryl, skin with subcu, __________ suture.  Steri- Strips were applied.  10 mL of 0.25% Sensorcaine placed in the skin and subcutaneous tissue afterwards.  Sterile dress applied to the knee. Tourniquet was deflated.  Normal circulation to the foot and ankle at the end of the case.  Compressive wrap was applied.  Knee immobilizer was applied.  There were no complications or problems.  Sponge and needle counts were correct.  He was then awakened, taken from the operating room to PACU in stable condition.  He will be kept in PACU and  stabilized overnight and observed.  No complications.  Sponge and __________ count correct.          ______________________________ Erasmo Leventhalobert Andrew Ady Heimann, M.D.     RAC/MEDQ  D:  07/16/2014  T:  07/17/2014  Job:  [id number]

## 2014-07-17 NOTE — Progress Notes (Signed)
Subjective: 1 Day Post-Op Procedure(s) (LRB): LEFT KNEE PARTIAL PATELLECTOMY   (Left) Patient reports pain as 1 on 0-10 scale.   Awake and very comfortable. Objective: Vital signs in last 24 hours: Temp:  [97.6 F (36.4 C)-98.4 F (36.9 C)] 97.7 F (36.5 C) (04/27 0516) Pulse Rate:  [71-99] 71 (04/27 0516) Resp:  [15-22] 16 (04/27 0516) BP: (105-155)/(61-81) 113/61 mmHg (04/27 0516) SpO2:  [97 %-100 %] 98 % (04/27 0516) Weight:  [62.143 kg (137 lb)] 62.143 kg (137 lb) (04/26 1238)  Intake/Output from previous day: 04/26 0701 - 04/27 0700 In: 2344.5 [P.O.:540; I.V.:1704.5; IV Piggyback:100] Out: 1350 [Urine:1350] Intake/Output this shift:     Recent Labs  07/16/14 1311  HGB 12.9*    Recent Labs  07/16/14 1311  HCT 38.0*    Recent Labs  07/16/14 1311  NA 140  K 4.0  CL 104  BUN 26*  CREATININE 1.70*  GLUCOSE 113*    Recent Labs  07/16/14 1311 07/17/14 0436  INR 1.17 1.19    Sensation intact distally Intact pulses distally Dorsiflexion/Plantar flexion intact splint dressing intact  Assessment/Plan: 1 Day Post-Op Procedure(s) (LRB): LEFT KNEE PARTIAL PATELLECTOMY   (Left)  Doing well ready for home  Rx peercocett and baclofen given to pt. PWB and no bending of knee. Keep dry and call for follow up in office in @ weeks.  Resume coumadin and follow up with them via phine today. Advance diet  Daniel [name] 07/17/2014, 7:48 AM

## 2014-07-17 NOTE — Progress Notes (Signed)
UR completed 
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
## 2014-07-26 ENCOUNTER — Telehealth: Payer: Self-pay | Admitting: Family Medicine
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
## 2014-08-01 ENCOUNTER — Ambulatory Visit: Payer: Medicare Other

## 2014-08-05 ENCOUNTER — Other Ambulatory Visit: Payer: Self-pay | Admitting: Family

## 2014-08-06 ENCOUNTER — Other Ambulatory Visit: Payer: Self-pay | Admitting: *Deleted

## 2014-08-06 ENCOUNTER — Other Ambulatory Visit: Payer: Self-pay | Admitting: General Practice

## 2014-08-06 MED ORDER — WARFARIN SODIUM 2.5 MG PO TABS: ORAL_TABLET | ORAL | Status: DC

## 2014-08-22 ENCOUNTER — Telehealth: Payer: Self-pay | Admitting: Family Medicine

## 2014-08-22 DIAGNOSIS — N400 Enlarged prostate without lower urinary tract symptoms: Secondary | ICD-10-CM

## 2014-08-22 MED ORDER — TAMSULOSIN HCL 0.4 MG PO CAPS: 0.4000 mg | ORAL_CAPSULE | Freq: Every day | ORAL | Status: DC

## 2014-08-22 NOTE — Telephone Encounter (Signed)
Medication refilled

## 2014-08-22 NOTE — Telephone Encounter (Signed)
Pt request refill  tamsulosin (FLOMAX) 0.4 MG CAPS capsule [facility]/ [facility]

## 2014-08-26 ENCOUNTER — Other Ambulatory Visit: Payer: Self-pay | Admitting: *Deleted

## 2014-08-26 ENCOUNTER — Ambulatory Visit (INDEPENDENT_AMBULATORY_CARE_PROVIDER_SITE_OTHER): Payer: Medicare Other | Admitting: *Deleted

## 2014-08-26 DIAGNOSIS — I4891 Unspecified atrial fibrillation: Secondary | ICD-10-CM | POA: Diagnosis not present

## 2014-08-26 DIAGNOSIS — Z5181 Encounter for therapeutic drug level monitoring: Secondary | ICD-10-CM | POA: Diagnosis not present

## 2014-08-26 DIAGNOSIS — N400 Enlarged prostate without lower urinary tract symptoms: Secondary | ICD-10-CM

## 2014-08-26 LAB — POCT INR: INR: 1.9

## 2014-08-26 MED ORDER — TAMSULOSIN HCL 0.4 MG PO CAPS: 0.4000 mg | ORAL_CAPSULE | Freq: Every day | ORAL | Status: DC

## 2014-08-26 NOTE — Progress Notes (Signed)
Pre visit review using our clinic review tool, if applicable. No additional management support is needed unless otherwise documented below in the visit note. 

## 2014-10-07 ENCOUNTER — Ambulatory Visit (INDEPENDENT_AMBULATORY_CARE_PROVIDER_SITE_OTHER): Payer: Medicare Other | Admitting: General Practice

## 2014-10-07 ENCOUNTER — Other Ambulatory Visit: Payer: Self-pay | Admitting: General Practice

## 2014-10-07 DIAGNOSIS — Z5181 Encounter for therapeutic drug level monitoring: Secondary | ICD-10-CM

## 2014-10-07 DIAGNOSIS — I4891 Unspecified atrial fibrillation: Secondary | ICD-10-CM | POA: Diagnosis not present

## 2014-10-07 LAB — POCT INR: INR: 1.7

## 2014-10-07 MED ORDER — WARFARIN SODIUM 2.5 MG PO TABS: ORAL_TABLET | ORAL | Status: DC

## 2014-10-07 NOTE — Progress Notes (Signed)
Pre visit review using our clinic review tool, if applicable. No additional management support is needed unless otherwise documented below in the visit note. 

## 2014-10-09 NOTE — Progress Notes (Signed)
Agree with note and management.

## 2014-11-12 ENCOUNTER — Other Ambulatory Visit: Payer: Self-pay | Admitting: Family Medicine

## 2014-11-18 ENCOUNTER — Ambulatory Visit: Payer: Medicare Other

## 2014-11-18 ENCOUNTER — Ambulatory Visit (INDEPENDENT_AMBULATORY_CARE_PROVIDER_SITE_OTHER): Payer: Medicare Other | Admitting: General Practice

## 2014-11-18 DIAGNOSIS — Z5181 Encounter for therapeutic drug level monitoring: Secondary | ICD-10-CM | POA: Diagnosis not present

## 2014-11-18 DIAGNOSIS — I4891 Unspecified atrial fibrillation: Secondary | ICD-10-CM | POA: Diagnosis not present

## 2014-11-18 LAB — POCT INR: INR: 1.5

## 2014-11-18 NOTE — Progress Notes (Signed)
Pre visit review using our clinic review tool, if applicable. No additional management support is needed unless otherwise documented below in the visit note. 

## 2014-12-25 ENCOUNTER — Telehealth: Payer: Self-pay | Admitting: Family Medicine

## 2014-12-25 ENCOUNTER — Other Ambulatory Visit: Payer: Self-pay | Admitting: *Deleted

## 2014-12-25 DIAGNOSIS — N400 Enlarged prostate without lower urinary tract symptoms: Secondary | ICD-10-CM

## 2014-12-25 MED ORDER — TAMSULOSIN HCL 0.4 MG PO CAPS: 0.4000 mg | ORAL_CAPSULE | Freq: Every day | ORAL | Status: DC

## 2014-12-25 NOTE — Telephone Encounter (Signed)
Pt need a refill on tamsulosin #90 w/refills [facility] [employer]

## 2014-12-26 MED ORDER — TAMSULOSIN HCL 0.4 MG PO CAPS: 0.4000 mg | ORAL_CAPSULE | Freq: Every day | ORAL | Status: DC

## 2014-12-26 NOTE — Telephone Encounter (Signed)
Medication called in 

## 2014-12-30 ENCOUNTER — Ambulatory Visit: Payer: Medicare Other

## 2015-01-02 ENCOUNTER — Ambulatory Visit (INDEPENDENT_AMBULATORY_CARE_PROVIDER_SITE_OTHER): Payer: Medicare Other | Admitting: General Practice

## 2015-01-02 DIAGNOSIS — Z5181 Encounter for therapeutic drug level monitoring: Secondary | ICD-10-CM

## 2015-01-02 DIAGNOSIS — I4891 Unspecified atrial fibrillation: Secondary | ICD-10-CM | POA: Diagnosis not present

## 2015-01-02 LAB — POCT INR: INR: 1.4

## 2015-01-02 NOTE — Progress Notes (Signed)
Pre visit review using our clinic review tool, if applicable. No additional management support is needed unless otherwise documented below in the visit note. 

## 2015-02-05 ENCOUNTER — Other Ambulatory Visit: Payer: Self-pay | Admitting: Family Medicine

## 2015-02-20 ENCOUNTER — Ambulatory Visit: Payer: Medicare Other

## 2015-02-24 ENCOUNTER — Ambulatory Visit (INDEPENDENT_AMBULATORY_CARE_PROVIDER_SITE_OTHER): Payer: Medicare Other | Admitting: General Practice

## 2015-02-24 DIAGNOSIS — I4891 Unspecified atrial fibrillation: Secondary | ICD-10-CM

## 2015-02-24 LAB — POCT INR: INR: 1.6

## 2015-02-24 NOTE — Progress Notes (Signed)
Pre visit review using our clinic review tool, if applicable. No additional management support is needed unless otherwise documented below in the visit note. 

## 2015-03-10 ENCOUNTER — Telehealth: Payer: Self-pay

## 2015-03-10 NOTE — Telephone Encounter (Signed)
Pt is in need of an AWV. I don't think that this year will be possible, but if we could get one for next year (if the pt is able and willing).   Let me know! Thanks for all of your help!

## 2015-03-13 NOTE — Telephone Encounter (Signed)
Cheryl please contact patient to set up a MAW exam for next year in a CPE slot for Dr Durene CalHunter.

## 2015-03-13 NOTE — Telephone Encounter (Signed)
Spoke with pt and he said he does not want to that right now.

## 2015-04-07 ENCOUNTER — Ambulatory Visit: Payer: Medicare Other

## 2015-04-14 ENCOUNTER — Ambulatory Visit (INDEPENDENT_AMBULATORY_CARE_PROVIDER_SITE_OTHER): Payer: Medicare Other | Admitting: General Practice

## 2015-04-14 DIAGNOSIS — I4891 Unspecified atrial fibrillation: Secondary | ICD-10-CM

## 2015-04-14 DIAGNOSIS — Z5181 Encounter for therapeutic drug level monitoring: Secondary | ICD-10-CM | POA: Diagnosis not present

## 2015-04-14 LAB — POCT INR: INR: 1.4

## 2015-04-14 NOTE — Progress Notes (Signed)
Pre visit review using our clinic review tool, if applicable. No additional management support is needed unless otherwise documented below in the visit note. INR is low today.  Patient is taking care of his wife and forgot to take medication two days.  I encouraged him to purchase and use a pill box to ensure he takes his medication.  A discussion of possible side effects due to a low INR took place.  Patient verbalized understanding.

## 2015-05-11 ENCOUNTER — Other Ambulatory Visit: Payer: Self-pay | Admitting: Internal Medicine

## 2015-05-15 ENCOUNTER — Ambulatory Visit (INDEPENDENT_AMBULATORY_CARE_PROVIDER_SITE_OTHER): Payer: Medicare Other | Admitting: Family Medicine

## 2015-05-15 ENCOUNTER — Encounter: Payer: Self-pay | Admitting: Family Medicine

## 2015-05-15 VITALS — BP 120/72 | HR 80 | Temp 98.9°F | Wt 142.0 lb

## 2015-05-15 DIAGNOSIS — R059 Cough, unspecified: Secondary | ICD-10-CM

## 2015-05-15 DIAGNOSIS — J069 Acute upper respiratory infection, unspecified: Secondary | ICD-10-CM | POA: Diagnosis not present

## 2015-05-15 DIAGNOSIS — R05 Cough: Secondary | ICD-10-CM | POA: Diagnosis not present

## 2015-05-15 MED ORDER — GUAIFENESIN-CODEINE 100-10 MG/5ML PO SOLN: 5.0000 mL | Freq: Four times a day (QID) | ORAL | Status: DC | PRN

## 2015-05-15 NOTE — Progress Notes (Signed)
PCP: Rogelia Boga, MD  Subjective:  Daniel [name] is a 82 y.o. year old very pleasant male patient who presents with Upper Respiratory infection symptoms including nasal congestion, sneezing, cough dry -started: Monday or tuesday -symptoms show no change -previous treatments: mucinex -sick contacts/travel/risks: denies flu exposure.   ROS-denies fever, SOB, NVD, tooth pain  Pertinent Past Medical History- gout, hypertension, BPH, a fib  Medications- reviewed  Current Outpatient Prescriptions  Medication Sig Dispense Refill  . allopurinol (ZYLOPRIM) 300 MG tablet TAKE 1 TABLET BY MOUTH ONCE A DAY 90 tablet 3  . amLODipine (NORVASC) 5 MG tablet TAKE 1 TABLET (5 MG TOTAL) BY MOUTH DAILY. 90 tablet 0  . atorvastatin (LIPITOR) 20 MG tablet TAKE 1 TABLET (20 MG TOTAL) BY MOUTH DAILY AT 6 PM. (replaces simvastatin) 90 tablet 0  . brimonidine (ALPHAGAN) 0.2 % ophthalmic solution Place 1 drop into both eyes 2 (two) times daily.     Marland Kitchen warfarin (COUMADIN) 2.5 MG tablet TAKE AS DIRECTED BY ANTICOAGULATION CLINIC 90 tablet 3  . guaiFENesin-codeine 100-10 MG/5ML syrup Take 5 mLs by mouth every 6 (six) hours as needed for cough. 180 mL 0   No current facility-administered medications for this visit.    Objective: BP 120/72 mmHg  Pulse 80  Temp(Src) 98.9 F (37.2 C)  Wt 142 lb (64.411 kg) Gen: NAD, resting comfortably- well appearing on exam. Infrequent cough HEENT: Turbinates erythematous with some yellow drainage, TM normal, pharynx mildly erythematous with no tonsilar exudate or edema, no sinus tenderness CV: irregularly irregular no murmurs rubs or gallops Lungs: CTAB no crackles, wheeze, rhonchi. No respiratory distress Abdomen: soft/nontender/nondistended/normal bowel sounds. No rebound or guarding.  Ext: no edema Skin: warm, dry, no rash Neuro: grossly normal, moves all extremities  Assessment/Plan:  Upper Respiratory infection History and exam today are suggestive of  viral URI.  We discussed that a serious infection or illness is unlikely. We also discussed reasons why current illness does not meet criteria for bacterial illness and therefore no antibiotics indicated. Also educated on signs that bacterial infection may have developed including fever, shortness of breath, symptoms that last more than 10 more days. We discussed treatment side effects, likely course, antibiotic misuse, transmission, and signs of developing a serious illness.  Symptomatic treatment with: codeine cough syrup  Finally, we reviewed reasons to return to care including if symptoms worsen or persist after 10 days or new concerns arise.  Meds ordered this encounter  Medications  . guaiFENesin-codeine 100-10 MG/5ML syrup    Sig: Take 5 mLs by mouth every 6 (six) hours as needed for cough.    Dispense:  180 mL    Refill:  0

## 2015-05-15 NOTE — Patient Instructions (Addendum)
Upper Respiratory infection History and exam today are suggestive of viral URI.  We discussed that a serious infection or illness is unlikely. We also discussed reasons why current illness does not meet criteria for bacterial illness and therefore no antibiotics indicated. Also educated on signs that bacterial infection may have developed including fever, shortness of breath, symptoms that last more than 10 more days. We discussed treatment side effects, likely course, antibiotic misuse, transmission, and signs of developing a serious illness.  Symptomatic treatment with: codeine cough syrup  Finally, we reviewed reasons to return to care including if symptoms worsen or persist after 10 days or new concerns arise.  Meds ordered this encounter  Medications  . guaiFENesin-codeine 100-10 MG/5ML syrup    Sig: Take 5 mLs by mouth every 6 (six) hours as needed for cough.    Dispense:  180 mL    Refill:  0    Schedule an appointment with Dr. Kirtland Bouchard at check out within the next 2-3 months. You need a regularly scheduled follow up. I understand your decision as a family to see Dr. Kirtland Bouchard- I am happy to help if he is not available

## 2015-05-20 ENCOUNTER — Encounter: Payer: Self-pay | Admitting: Gastroenterology

## 2015-05-26 ENCOUNTER — Ambulatory Visit (INDEPENDENT_AMBULATORY_CARE_PROVIDER_SITE_OTHER): Payer: Medicare Other | Admitting: General Practice

## 2015-05-26 DIAGNOSIS — Z5181 Encounter for therapeutic drug level monitoring: Secondary | ICD-10-CM | POA: Diagnosis not present

## 2015-05-26 DIAGNOSIS — I4891 Unspecified atrial fibrillation: Secondary | ICD-10-CM

## 2015-05-26 LAB — POCT INR: INR: 2.2

## 2015-05-26 NOTE — Progress Notes (Signed)
Pre visit review using our clinic review tool, if applicable. No additional management support is needed unless otherwise documented below in the visit note. 

## 2015-06-15 ENCOUNTER — Other Ambulatory Visit: Payer: Self-pay | Admitting: Family Medicine

## 2015-07-07 ENCOUNTER — Ambulatory Visit (INDEPENDENT_AMBULATORY_CARE_PROVIDER_SITE_OTHER): Payer: Medicare Other | Admitting: General Practice

## 2015-07-07 DIAGNOSIS — I4891 Unspecified atrial fibrillation: Secondary | ICD-10-CM

## 2015-07-07 DIAGNOSIS — Z5181 Encounter for therapeutic drug level monitoring: Secondary | ICD-10-CM

## 2015-07-07 LAB — POCT INR: INR: 1.5

## 2015-07-07 NOTE — Progress Notes (Signed)
Pre visit review using our clinic review tool, if applicable. No additional management support is needed unless otherwise documented below in the visit note. 

## 2015-07-14 ENCOUNTER — Ambulatory Visit: Payer: Medicare Other | Admitting: Internal Medicine

## 2015-08-21 ENCOUNTER — Other Ambulatory Visit: Payer: Self-pay | Admitting: Internal Medicine

## 2015-08-25 ENCOUNTER — Ambulatory Visit (INDEPENDENT_AMBULATORY_CARE_PROVIDER_SITE_OTHER): Payer: Medicare Other | Admitting: General Practice

## 2015-08-25 DIAGNOSIS — Z5181 Encounter for therapeutic drug level monitoring: Secondary | ICD-10-CM | POA: Diagnosis not present

## 2015-08-25 DIAGNOSIS — I4891 Unspecified atrial fibrillation: Secondary | ICD-10-CM | POA: Diagnosis not present

## 2015-08-25 LAB — POCT INR: INR: 1.7

## 2015-08-25 NOTE — Progress Notes (Signed)
Pre visit review using our clinic review tool, if applicable. No additional management support is needed unless otherwise documented below in the visit note. 

## 2015-08-26 ENCOUNTER — Other Ambulatory Visit: Payer: Self-pay | Admitting: Internal Medicine

## 2015-10-06 ENCOUNTER — Ambulatory Visit (INDEPENDENT_AMBULATORY_CARE_PROVIDER_SITE_OTHER): Payer: Medicare Other | Admitting: General Practice

## 2015-10-06 DIAGNOSIS — I4891 Unspecified atrial fibrillation: Secondary | ICD-10-CM | POA: Diagnosis not present

## 2015-10-06 DIAGNOSIS — Z5181 Encounter for therapeutic drug level monitoring: Secondary | ICD-10-CM

## 2015-10-06 LAB — POCT INR: INR: 1.5

## 2015-10-06 NOTE — Progress Notes (Signed)
Pre visit review using our clinic review tool, if applicable. No additional management support is needed unless otherwise documented below in the visit note. 

## 2015-12-01 ENCOUNTER — Ambulatory Visit (INDEPENDENT_AMBULATORY_CARE_PROVIDER_SITE_OTHER): Payer: Medicare Other | Admitting: General Practice

## 2015-12-01 DIAGNOSIS — I4891 Unspecified atrial fibrillation: Secondary | ICD-10-CM | POA: Diagnosis not present

## 2015-12-01 DIAGNOSIS — Z5181 Encounter for therapeutic drug level monitoring: Secondary | ICD-10-CM | POA: Diagnosis not present

## 2015-12-01 LAB — POCT INR: INR: 1.7

## 2016-01-09 ENCOUNTER — Other Ambulatory Visit: Payer: Self-pay | Admitting: Family Medicine

## 2016-01-12 ENCOUNTER — Ambulatory Visit: Payer: Medicare Other

## 2016-01-13 ENCOUNTER — Ambulatory Visit: Payer: Medicare Other | Admitting: Internal Medicine

## 2016-01-13 NOTE — Telephone Encounter (Signed)
Okay to refill? 

## 2016-01-14 ENCOUNTER — Ambulatory Visit: Payer: Medicare Other

## 2016-01-19 ENCOUNTER — Ambulatory Visit (INDEPENDENT_AMBULATORY_CARE_PROVIDER_SITE_OTHER): Payer: Medicare Other | Admitting: General Practice

## 2016-01-19 DIAGNOSIS — Z5181 Encounter for therapeutic drug level monitoring: Secondary | ICD-10-CM

## 2016-01-19 DIAGNOSIS — I4891 Unspecified atrial fibrillation: Secondary | ICD-10-CM

## 2016-01-19 LAB — POCT INR: INR: 1.2

## 2016-01-19 NOTE — Patient Instructions (Signed)
Pre visit review using our clinic review tool, if applicable. No additional management support is needed unless otherwise documented below in the visit note. 

## 2016-01-23 ENCOUNTER — Encounter: Payer: Self-pay | Admitting: Internal Medicine

## 2016-01-23 ENCOUNTER — Ambulatory Visit (INDEPENDENT_AMBULATORY_CARE_PROVIDER_SITE_OTHER): Payer: Medicare Other | Admitting: Internal Medicine

## 2016-01-23 VITALS — BP 130/74 | HR 78 | Temp 97.5°F | Resp 20 | Ht 66.0 in | Wt 152.0 lb

## 2016-01-23 DIAGNOSIS — N183 Chronic kidney disease, stage 3 unspecified: Secondary | ICD-10-CM

## 2016-01-23 DIAGNOSIS — I10 Essential (primary) hypertension: Secondary | ICD-10-CM | POA: Diagnosis not present

## 2016-01-23 DIAGNOSIS — I480 Paroxysmal atrial fibrillation: Secondary | ICD-10-CM | POA: Diagnosis not present

## 2016-01-23 DIAGNOSIS — Z0000 Encounter for general adult medical examination without abnormal findings: Secondary | ICD-10-CM

## 2016-01-23 DIAGNOSIS — E785 Hyperlipidemia, unspecified: Secondary | ICD-10-CM

## 2016-01-23 LAB — CBC WITH DIFFERENTIAL/PLATELET
BASOS PCT: 0 %
Basophils Absolute: 0 cells/uL (ref 0–200)
EOS ABS: 266 {cells}/uL (ref 15–500)
Eosinophils Relative: 2 %
HCT: 37.9 % — ABNORMAL LOW (ref 38.5–50.0)
HEMOGLOBIN: 12.4 g/dL — AB (ref 13.2–17.1)
LYMPHS ABS: 4123 {cells}/uL — AB (ref 850–3900)
Lymphocytes Relative: 31 %
MCH: 30.8 pg (ref 27.0–33.0)
MCHC: 32.7 g/dL (ref 32.0–36.0)
MCV: 94.3 fL (ref 80.0–100.0)
MONOS PCT: 9 %
MPV: 10.9 fL (ref 7.5–12.5)
Monocytes Absolute: 1197 cells/uL — ABNORMAL HIGH (ref 200–950)
NEUTROS ABS: 7714 {cells}/uL (ref 1500–7800)
Neutrophils Relative %: 58 %
PLATELETS: 282 10*3/uL (ref 140–400)
RBC: 4.02 MIL/uL — ABNORMAL LOW (ref 4.20–5.80)
RDW: 15.3 % — ABNORMAL HIGH (ref 11.0–15.0)
WBC: 13.3 10*3/uL — ABNORMAL HIGH (ref 3.8–10.8)

## 2016-01-23 LAB — TSH: TSH: 4.16 m[IU]/L (ref 0.40–4.50)

## 2016-01-23 NOTE — Progress Notes (Signed)
Pre visit review using our clinic review tool, if applicable. No additional management support is needed unless otherwise documented below in the visit note. 

## 2016-01-23 NOTE — Addendum Note (Signed)
Addended by: Baldwin CrownJOHNSON, Macklyn Glandon D on: 01/23/2016 04:08 PM   Modules accepted: Orders
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
## 2016-01-23 NOTE — Patient Instructions (Signed)
Limit your sodium (Salt) intake  Please check your blood pressure on a regular basis.  If it is consistently greater than 150/90, please make an office appointment.    It is important that you exercise regularly, at least 20 minutes 3 to 4 times per week.  If you develop chest pain or shortness of breath seek  medical attention.  .  Continue INR/ Coumadin checks every 4-6 weeks  .  Return in 6 months for follow-up

## 2016-01-24 LAB — LIPID PANEL
CHOL/HDL RATIO: 3.5 ratio (ref <=5.0)
CHOLESTEROL: 145 mg/dL (ref 125–200)
HDL: 41 mg/dL (ref >=40)
LDL Cholesterol: 80 mg/dL (ref <130)
TRIGLYCERIDES: 118 mg/dL (ref <150)
VLDL: 24 mg/dL (ref <30)

## 2016-01-24 LAB — COMPREHENSIVE METABOLIC PANEL
ALBUMIN: 3.9 g/dL (ref 3.6–5.1)
ALT: 24 U/L (ref 9–46)
AST: 32 U/L (ref 10–35)
Alkaline Phosphatase: 138 U/L — ABNORMAL HIGH (ref 40–115)
BILIRUBIN TOTAL: 0.3 mg/dL (ref 0.2–1.2)
BUN: 30 mg/dL — ABNORMAL HIGH (ref 7–25)
CALCIUM: 9 mg/dL (ref 8.6–10.3)
CHLORIDE: 106 mmol/L (ref 98–110)
CO2: 23 mmol/L (ref 20–31)
Creat: 1.99 mg/dL — ABNORMAL HIGH (ref 0.70–1.11)
GLUCOSE: 61 mg/dL — AB (ref 65–99)
POTASSIUM: 4.2 mmol/L (ref 3.5–5.3)
Sodium: 141 mmol/L (ref 135–146)
Total Protein: 7.4 g/dL (ref 6.1–8.1)

## 2016-03-01 ENCOUNTER — Ambulatory Visit (INDEPENDENT_AMBULATORY_CARE_PROVIDER_SITE_OTHER): Payer: Medicare Other | Admitting: General Practice

## 2016-03-01 DIAGNOSIS — I4891 Unspecified atrial fibrillation: Secondary | ICD-10-CM

## 2016-03-01 DIAGNOSIS — Z5181 Encounter for therapeutic drug level monitoring: Secondary | ICD-10-CM

## 2016-03-01 LAB — POCT INR: INR: 1.7

## 2016-03-16 ENCOUNTER — Other Ambulatory Visit: Payer: Self-pay | Admitting: Family Medicine

## 2016-03-23 ENCOUNTER — Encounter: Payer: Self-pay | Admitting: Family Medicine

## 2016-03-23 ENCOUNTER — Ambulatory Visit (INDEPENDENT_AMBULATORY_CARE_PROVIDER_SITE_OTHER): Payer: PRIVATE HEALTH INSURANCE | Admitting: Family Medicine

## 2016-03-23 VITALS — BP 118/68 | HR 90 | Temp 97.9°F

## 2016-03-23 DIAGNOSIS — J069 Acute upper respiratory infection, unspecified: Secondary | ICD-10-CM

## 2016-03-23 MED ORDER — GUAIFENESIN-CODEINE 100-10 MG/5ML PO SOLN: 5.0000 mL | Freq: Four times a day (QID) | ORAL | 0 refills | Status: DC | PRN

## 2016-03-23 NOTE — Progress Notes (Signed)
PCP: Rogelia BogaKWIATKOWSKI,PETER FRANK, MD  Subjective:  Daniel [name] is a 83 y.o. year old very pleasant male patient who presents with Upper Respiratory infection symptoms including nasal congestion, sore throat, cough non procutive but feels like it is in his chest at times -started: 3 days ago, symptoms show no change -previous treatments: some OTC bee medicine- after taking this yesterday was taking some of his pills and felt like they got stuck- felt like he was choking and having a hard time breathing -sick contacts/travel/risks: denies flu exposure.  -Hx of: allergies  ROS-denies fever, NVD, tooth pain  Pertinent Past Medical History-  Patient Active Problem List   Diagnosis Date Noted  . Atrial fibrillation (HCC) 11/04/2006    Priority: High  . History of cardiovascular disorder 08/24/2006    Priority: High  . BPH (benign prostatic hyperplasia) 10/31/2013    Priority: Medium  . Dyslipidemia 08/24/2006    Priority: Medium  . GOUT 08/24/2006    Priority: Medium  . Essential hypertension 08/24/2006    Priority: Medium  . CKD (chronic kidney disease), stage III 08/24/2006    Priority: Medium  . PULMONARY NODULE, RIGHT UPPER LOBE 11/24/2007    Priority: Low  . S/P knee surgery 07/16/2014  . Bleeding nose 03/29/2014  . Epistaxis 03/28/2014  . Encounter for therapeutic drug monitoring 07/05/2013   Medications- reviewed  Current Outpatient Prescriptions  Medication Sig Dispense Refill  . allopurinol (ZYLOPRIM) 300 MG tablet TAKE 1 TABLET BY MOUTH ONCE A DAY 90 tablet 3  . amLODipine (NORVASC) 5 MG tablet TAKE 1 TABLET (5 MG TOTAL) BY MOUTH DAILY. 90 tablet 3  . atorvastatin (LIPITOR) 20 MG tablet TAKE 1 TABLET (20 MG TOTAL) BY MOUTH DAILY AT 6 PM. (replaces simvastatin) 90 tablet 1  . brimonidine (ALPHAGAN) 0.2 % ophthalmic solution Place 1 drop into both eyes 2 (two) times daily.     . tamsulosin (FLOMAX) 0.4 MG CAPS capsule TAKE 1 CAPSULE (0.4 MG TOTAL) BY MOUTH DAILY AFTER  BREAKFAST. 90 capsule 2  . VOLTAREN 1 % GEL     . warfarin (COUMADIN) 2.5 MG tablet TAKE AS DIRECTED BY ANTICOAGULATION CLINIC 90 tablet 3   No current facility-administered medications for this visit.     Objective: BP 118/68 (BP Location: Left Arm, Patient Position: Sitting, Cuff Size: Normal)   Pulse 90   Temp 97.9 F (36.6 C) (Oral)   SpO2 98%  Gen: NAD, resting comfortably HEENT: Turbinates erythematous, TM normal, pharynx mildly erythematous with no tonsilar exudate or edema, no sinus tenderness CV: RRR no murmurs rubs or gallops Lungs: CTAB no crackles, wheeze, rhonchi Abdomen: soft/nontender/nondistended/normal bowel sounds. No rebound or guarding.  Ext: no edema Skin: warm, dry, no rash Neuro: grossly normal, moves all extremities  Assessment/Plan:  Upper Respiratory infection History and exam today are suggestive of viral infection most likely due to upper respiratory infection. Symptomatic treatment with: codeine cough syrup as he had issue of feeling like he was choking with pills after using an OTC medicine with "bee" in it- he will stop this OTC medicine. If he has this issue again when not on this medicine- advised to return to care for further workup.   We discussed that we did not find any infection that had higher probability of being bacterial such as pneumonia or strep throat. We discussed signs that bacterial infection may have developed particularly fever or shortness of breath. Likely course of 1-2 weeks. Patient is contagious and advised good handwashing and consideration of  mask If going to be in public places.   Finally, we reviewed reasons to return to care including if symptoms worsen or persist or new concerns arise- once again particularly shortness of breath or fever. Also patient will return to care if has recurrence of his feeling like pill getting stuck sensation.   Meds ordered this encounter  Medications  . guaiFENesin-codeine 100-10 MG/5ML syrup     Sig: Take 5 mLs by mouth every 6 (six) hours as needed for cough.    Dispense:  120 mL    Refill:  0  no driving for 8 hours after taking this.   Tana Conch, MD

## 2016-03-23 NOTE — Patient Instructions (Addendum)
Upper Respiratory infection History and exam today are suggestive of viral infection most likely due to upper respiratory infection. Symptomatic treatment with: codeine cough syrup. Will stop OTC "bee" medicine.   We discussed that we did not find any infection that had higher probability of being bacterial such as pneumonia or strep throat. We discussed signs that bacterial infection may have developed particularly fever or shortness of breath. Likely course of 1-2 weeks. Patient is contagious and advised good handwashing and consideration of mask If going to be in public places.   Finally, we reviewed reasons to return to care including if symptoms worsen or persist or new concerns arise- once again particularly shortness of breath or fever. Also patient will return to care if has recurrence of his feeling like pill getting stuck sensation.   Meds ordered this encounter  Medications  . guaiFENesin-codeine 100-10 MG/5ML syrup    Sig: Take 5 mLs by mouth every 6 (six) hours as needed for cough.    Dispense:  120 mL    Refill:  0  no driving for 8 hours after taking this.   Need to schedule a check to see me in next few months to follow up on regular medical problems.

## 2016-03-23 NOTE — Progress Notes (Signed)
Pre visit review using our clinic review tool, if applicable. No additional management support is needed unless otherwise documented below in the visit note. 

## 2016-04-08 ENCOUNTER — Ambulatory Visit: Payer: Self-pay | Admitting: Podiatry

## 2016-04-12 ENCOUNTER — Ambulatory Visit: Payer: PRIVATE HEALTH INSURANCE

## 2016-04-14 ENCOUNTER — Ambulatory Visit (INDEPENDENT_AMBULATORY_CARE_PROVIDER_SITE_OTHER): Payer: PRIVATE HEALTH INSURANCE | Admitting: General Practice

## 2016-04-14 DIAGNOSIS — Z5181 Encounter for therapeutic drug level monitoring: Secondary | ICD-10-CM

## 2016-04-14 DIAGNOSIS — I4891 Unspecified atrial fibrillation: Secondary | ICD-10-CM | POA: Diagnosis not present

## 2016-04-14 LAB — POCT INR: INR: 1.7

## 2016-04-14 NOTE — Patient Instructions (Signed)
Pre visit review using our clinic review tool, if applicable. No additional management support is needed unless otherwise documented below in the visit note. 

## 2016-04-16 ENCOUNTER — Encounter: Payer: Self-pay | Admitting: Podiatry

## 2016-04-16 ENCOUNTER — Ambulatory Visit (INDEPENDENT_AMBULATORY_CARE_PROVIDER_SITE_OTHER): Payer: PRIVATE HEALTH INSURANCE | Admitting: Podiatry

## 2016-04-16 DIAGNOSIS — B351 Tinea unguium: Secondary | ICD-10-CM

## 2016-04-16 DIAGNOSIS — M79676 Pain in unspecified toe(s): Secondary | ICD-10-CM | POA: Diagnosis not present

## 2016-04-16 NOTE — Progress Notes (Signed)
   Subjective:    Patient ID: Daniel [name], Daniel [name]    DOB: 1932/10/26, 83 y.o.   MRN: [id number]  HPI 83 year old Daniel [name] presents the also concerns of thick, painful, elongated toenails that he cannot trim himself to both of his feet. He states that they're causing pressure in shoes. Denies any redness or drainage or any swelling. Toenail sites. He has no recent treatment. No other complaints.   Review of Systems  All other systems reviewed and are negative.      Objective:   Physical Exam General:NAD  Dermatological: Left first, fourth and right first, third digit toenails are dystrophic, discolored, hypertrophic. There is tenderness these nails 4. There is no swelling redness or drainage. There is no open sores or pre-ulcer lesions identified.  Vascular: Dorsalis Pedis artery and Posterior Tibial artery pedal pulses are palpable bilateral with immedate capillary fill time.There is no pain with calf compression, swelling, warmth, erythema.   Neruologic: Grossly intact via light touch bilateral. Vibratory intact via tuning fork bilateral. Protective threshold with Semmes Wienstein monofilament intact to all pedal sites bilateral.   Musculoskeletal: No gross boney pedal deformities bilateral. No pain, crepitus, or limitation noted with foot and ankle range of motion bilateral. Muscular strength 5/5 in all groups tested bilateral.  Gait: Unassisted, Nonantalgic.     Assessment & Plan:  83 year old Daniel [name] with symptomatic onychomycosis 4 -Treatment options discussed including all alternatives, risks, and complications -Etiology of symptoms were discussed -Nails debrided 4 without complications or bleeding. -Daily foot inspection -Follow-up in 3 months or sooner if any problems arise. In the meantime, encouraged to call the office with any questions, concerns, change in symptoms.   Ovid CurdMatthew Wagoner, DPM

## 2016-05-05 DIAGNOSIS — M25512 Pain in left shoulder: Secondary | ICD-10-CM | POA: Diagnosis not present

## 2016-05-24 ENCOUNTER — Other Ambulatory Visit: Payer: Self-pay | Admitting: Family Medicine

## 2016-05-26 ENCOUNTER — Ambulatory Visit: Payer: PRIVATE HEALTH INSURANCE

## 2016-05-31 ENCOUNTER — Ambulatory Visit: Payer: PRIVATE HEALTH INSURANCE

## 2016-05-31 ENCOUNTER — Ambulatory Visit (INDEPENDENT_AMBULATORY_CARE_PROVIDER_SITE_OTHER): Payer: PPO | Admitting: General Practice

## 2016-05-31 DIAGNOSIS — Z5181 Encounter for therapeutic drug level monitoring: Secondary | ICD-10-CM | POA: Diagnosis not present

## 2016-05-31 LAB — POCT INR: INR: 2.1

## 2016-05-31 NOTE — Patient Instructions (Signed)
Pre visit review using our clinic review tool, if applicable. No additional management support is needed unless otherwise documented below in the visit note. 

## 2016-05-31 NOTE — Progress Notes (Signed)
I have reviewed and agree with note, evaluation, plan.   Stephen Hunter, MD  

## 2016-06-16 DIAGNOSIS — H401131 Primary open-angle glaucoma, bilateral, mild stage: Secondary | ICD-10-CM | POA: Diagnosis not present

## 2016-06-20 ENCOUNTER — Other Ambulatory Visit: Payer: Self-pay | Admitting: Internal Medicine

## 2016-07-12 ENCOUNTER — Ambulatory Visit (INDEPENDENT_AMBULATORY_CARE_PROVIDER_SITE_OTHER): Payer: PPO | Admitting: General Practice

## 2016-07-12 DIAGNOSIS — Z5181 Encounter for therapeutic drug level monitoring: Secondary | ICD-10-CM

## 2016-07-12 DIAGNOSIS — I4891 Unspecified atrial fibrillation: Secondary | ICD-10-CM

## 2016-07-12 LAB — POCT INR: INR: 1.5

## 2016-07-12 NOTE — Patient Instructions (Signed)
Pre visit review using our clinic review tool, if applicable. No additional management support is needed unless otherwise documented below in the visit note. 

## 2016-07-12 NOTE — Progress Notes (Signed)
I have reviewed and agree with note, evaluation, plan.   Makenzee Choudhry, MD  

## 2016-07-25 ENCOUNTER — Other Ambulatory Visit: Payer: Self-pay | Admitting: Family Medicine

## 2016-08-30 ENCOUNTER — Ambulatory Visit (INDEPENDENT_AMBULATORY_CARE_PROVIDER_SITE_OTHER): Payer: PPO | Admitting: General Practice

## 2016-08-30 DIAGNOSIS — Z5181 Encounter for therapeutic drug level monitoring: Secondary | ICD-10-CM | POA: Diagnosis not present

## 2016-08-30 DIAGNOSIS — I4891 Unspecified atrial fibrillation: Secondary | ICD-10-CM

## 2016-08-30 LAB — POCT INR: INR: 2.4

## 2016-08-30 NOTE — Progress Notes (Signed)
I have reviewed and agree with note, evaluation, plan.   Amariana Mirando, MD  

## 2016-08-30 NOTE — Patient Instructions (Signed)
Pre visit review using our clinic review tool, if applicable. No additional management support is needed unless otherwise documented below in the visit note. 

## 2016-09-13 ENCOUNTER — Other Ambulatory Visit: Payer: Self-pay | Admitting: Internal Medicine

## 2016-10-11 ENCOUNTER — Ambulatory Visit (INDEPENDENT_AMBULATORY_CARE_PROVIDER_SITE_OTHER): Payer: PPO | Admitting: General Practice

## 2016-10-11 DIAGNOSIS — I4891 Unspecified atrial fibrillation: Secondary | ICD-10-CM | POA: Diagnosis not present

## 2016-10-11 DIAGNOSIS — Z5181 Encounter for therapeutic drug level monitoring: Secondary | ICD-10-CM | POA: Diagnosis not present

## 2016-10-11 LAB — POCT INR: INR: 1.9

## 2016-10-11 NOTE — Progress Notes (Signed)
I have reviewed and agree with note, evaluation, plan.   Ziaire Hagos, MD  

## 2016-10-11 NOTE — Patient Instructions (Signed)
Pre visit review using our clinic review tool, if applicable. No additional management support is needed unless otherwise documented below in the visit note. 

## 2016-10-14 ENCOUNTER — Ambulatory Visit (INDEPENDENT_AMBULATORY_CARE_PROVIDER_SITE_OTHER): Payer: PPO | Admitting: Podiatry

## 2016-10-14 ENCOUNTER — Encounter: Payer: Self-pay | Admitting: Podiatry

## 2016-10-14 DIAGNOSIS — B351 Tinea unguium: Secondary | ICD-10-CM

## 2016-10-14 DIAGNOSIS — M79676 Pain in unspecified toe(s): Secondary | ICD-10-CM | POA: Diagnosis not present

## 2016-10-14 NOTE — Progress Notes (Signed)
Subjective: 83 y.o. returns the office today for painful, elongated, thickened toenails which he cannot trim himself. Denies any redness or drainage around the nails. Denies any acute changes since last appointment and no new complaints today. Denies any systemic complaints such as fevers, chills, nausea, vomiting.   Objective: AAO 3, NAD DP/PT pulses palpable, CRT less than 3 seconds Nails hypertrophic, dystrophic, elongated, brittle, discolored 4. There is tenderness overlying the left 1st, 4th and the right first and 3rd toenails.. There is no surrounding erythema or drainage along the nail sites. No open lesions or pre-ulcerative lesions are identified. No other areas of tenderness bilateral lower extremities. No overlying edema, erythema, increased warmth. No pain with calf compression, swelling, warmth, erythema.  Assessment: Patient presents with symptomatic onychomycosis  Plan: -Treatment options including alternatives, risks, complications were discussed -Nails sharply debrided 4 without complication/bleeding. -Discussed daily foot inspection. If there are any changes, to call the office immediately.  -Follow-up in 3 months or sooner if any problems are to arise. In the meantime, encouraged to call the office with any questions, concerns, changes symptoms.  Ovid CurdMatthew Asanti Craigo, DPM

## 2016-11-29 ENCOUNTER — Ambulatory Visit (INDEPENDENT_AMBULATORY_CARE_PROVIDER_SITE_OTHER): Payer: PPO | Admitting: General Practice

## 2016-11-29 DIAGNOSIS — Z7901 Long term (current) use of anticoagulants: Secondary | ICD-10-CM

## 2016-11-29 DIAGNOSIS — I4891 Unspecified atrial fibrillation: Secondary | ICD-10-CM

## 2016-11-29 DIAGNOSIS — Z5181 Encounter for therapeutic drug level monitoring: Secondary | ICD-10-CM

## 2016-11-29 LAB — POCT INR: INR: 2.2

## 2016-11-29 NOTE — Progress Notes (Signed)
I would also like for you to sign up for an annual wellness visit with our nurse, Darl PikesSusan, who specializes in the annual wellness exam. This is a free benefit under medicare that may help us find additional ways to help you. Some highlights are reviewing medications, lifestyle, and doing a dementia screen.

## 2016-11-29 NOTE — Patient Instructions (Signed)
Pre visit review using our clinic review tool, if applicable. No additional management support is needed unless otherwise documented below in the visit note. 

## 2016-11-30 ENCOUNTER — Other Ambulatory Visit: Payer: Self-pay | Admitting: Family Medicine

## 2016-12-01 DIAGNOSIS — Z7901 Long term (current) use of anticoagulants: Secondary | ICD-10-CM | POA: Insufficient documentation

## 2016-12-20 DIAGNOSIS — H401131 Primary open-angle glaucoma, bilateral, mild stage: Secondary | ICD-10-CM | POA: Diagnosis not present

## 2016-12-27 ENCOUNTER — Ambulatory Visit: Payer: PPO

## 2017-01-11 ENCOUNTER — Ambulatory Visit (INDEPENDENT_AMBULATORY_CARE_PROVIDER_SITE_OTHER): Payer: PPO | Admitting: *Deleted

## 2017-01-11 DIAGNOSIS — I4891 Unspecified atrial fibrillation: Secondary | ICD-10-CM | POA: Diagnosis not present

## 2017-01-11 DIAGNOSIS — Z7901 Long term (current) use of anticoagulants: Secondary | ICD-10-CM

## 2017-01-11 LAB — POCT INR: INR: 2.1

## 2017-01-11 NOTE — Progress Notes (Signed)
I have reviewed and agree with note, evaluation, plan.   Oluwadarasimi Redmon, MD  

## 2017-01-13 ENCOUNTER — Ambulatory Visit (INDEPENDENT_AMBULATORY_CARE_PROVIDER_SITE_OTHER): Payer: PPO | Admitting: Podiatry

## 2017-01-13 DIAGNOSIS — M79676 Pain in unspecified toe(s): Secondary | ICD-10-CM | POA: Diagnosis not present

## 2017-01-13 DIAGNOSIS — B351 Tinea unguium: Secondary | ICD-10-CM

## 2017-01-13 NOTE — Progress Notes (Signed)
Subjective: 83 y.o. returns the office today for painful, elongated, thickened toenails which he cannot trim himself. Denies any redness or drainage around the nails. Denies any acute changes since last appointment and no new complaints today. Denies any systemic complaints such as fevers, chills, nausea, vomiting.   Objective: AAO 3, NAD DP/PT pulses palpable, CRT less than 3 seconds Nails hypertrophic, dystrophic, elongated, brittle, discolored 10. All of the nails appear to be discolored and thickened. No open lesions or pre-ulcerative lesions are identified. No other areas of tenderness bilateral lower extremities. No overlying edema, erythema, increased warmth. No pain with calf compression, swelling, warmth, erythema.  Assessment: Patient presents with symptomatic onychomycosis  Plan: -Treatment options including alternatives, risks, complications were discussed -Nails sharply debrided 10 without complication/bleeding. -Discussed daily foot inspection. If there are any changes, to call the office immediately.  -Follow-up in 3 months or sooner if any problems are to arise. In the meantime, encouraged to call the office with any questions, concerns, changes symptoms.  Ovid CurdMatthew Wagoner, DPM

## 2017-01-22 ENCOUNTER — Other Ambulatory Visit: Payer: Self-pay | Admitting: Internal Medicine

## 2017-02-22 ENCOUNTER — Ambulatory Visit (INDEPENDENT_AMBULATORY_CARE_PROVIDER_SITE_OTHER): Payer: PPO | Admitting: *Deleted

## 2017-02-22 DIAGNOSIS — I4891 Unspecified atrial fibrillation: Secondary | ICD-10-CM | POA: Diagnosis not present

## 2017-02-22 DIAGNOSIS — Z7901 Long term (current) use of anticoagulants: Secondary | ICD-10-CM

## 2017-02-22 LAB — POCT INR: INR: 1.8

## 2017-02-22 NOTE — Progress Notes (Signed)
I have reviewed and agree with note, evaluation, plan.   Aloria Looper, MD  

## 2017-02-22 NOTE — Patient Instructions (Addendum)
Continue to take 1 tablet all days except a half tablet on Wednesday and Saturday. Return in 6 weeks.  Pre-visit review using our clinic review tool, if applicable. No additional management support is needed unless otherwise documented below in the visit note.

## 2017-03-21 ENCOUNTER — Other Ambulatory Visit: Payer: Self-pay | Admitting: Family Medicine

## 2017-03-23 ENCOUNTER — Encounter: Payer: Self-pay | Admitting: Family Medicine

## 2017-03-23 ENCOUNTER — Ambulatory Visit (INDEPENDENT_AMBULATORY_CARE_PROVIDER_SITE_OTHER): Payer: PPO | Admitting: Family Medicine

## 2017-03-23 VITALS — BP 120/66 | HR 92 | Temp 98.3°F | Ht 66.0 in | Wt 142.4 lb

## 2017-03-23 DIAGNOSIS — K1120 Sialoadenitis, unspecified: Secondary | ICD-10-CM | POA: Diagnosis not present

## 2017-03-23 MED ORDER — CIPROFLOXACIN HCL 500 MG PO TABS: 500.0000 mg | ORAL_TABLET | Freq: Two times a day (BID) | ORAL | 0 refills | Status: DC

## 2017-03-23 MED ORDER — CEFTRIAXONE SODIUM 1 G IJ SOLR
1.0000 g | Freq: Once | INTRAMUSCULAR | Status: AC
  Administered 2017-03-23: 1 g via INTRAMUSCULAR

## 2017-03-23 MED ORDER — CLINDAMYCIN HCL 150 MG PO CAPS: 450.0000 mg | ORAL_CAPSULE | Freq: Three times a day (TID) | ORAL | 0 refills | Status: DC

## 2017-03-23 NOTE — Patient Instructions (Addendum)
Take both antibiotics 1. Ciprofloxacin 500mg  twice a day 2. Clindamycin 150mg  tablets- take 3 of them every 8 hours so 3x a day.   Giving you a shot of antibiotics - ceftriaxone 1000mg  today  You have to drink at least 60 oz of water everyday  Suck on sour candies or lemon drops to help the parotid gland drain  If you cannot drink enough water or symptoms are not improving within 2 days- seek care at the hospital.   I want to recheck you Friday morning at 8 AM- they will have to make this slot in the schedule- please work with them at the desk before you leave. Please arrive by 7 45 so we can have you roomed by 8    Parotitis Parotitis means that you have irritation and swelling (inflammation) in one or both of your parotid glands. These glands make spit (saliva). They are found on each side of your face, below and in front of your earlobes. You may or may not have pain with this condition. Follow these instructions at home: Medicines  Take over-the-counter and prescription medicines only as told by your doctor.  If you were prescribed an antibiotic medicine, take it as told by your doctor. Do not stop taking the antibiotic even if you start to feel better. Managing pain and swelling  Apply warm cloths (compresses) to the swollen area as told by your doctor.  Gently rub your parotid glands as told by your doctor. General instructions   Drink enough fluid to keep your pee (urine) clear or pale yellow.  Suck on sour candy. This may help: ? To make your mouth less dry. ? To make more spit.  Keep your mouth clean and moist. ? Gargle with a salt-water mixture 3-4 times per day, or as needed. ? To make a salt-water mixture, stir -1 tsp of salt into 1 cup of warm water.  Take good care of your mouth: ? Brush your teeth at least two times per day. ? Floss your teeth every day. ? See your dentist regularly.  Do not use tobacco products. These include cigarettes, chewing tobacco,  or e-cigarettes. If you need help quitting,  ask your doctor.  Keep all follow-up visits as told by your doctor. This is important. Contact a doctor if:  You have a fever or chills.  You have new symptoms.  Your symptoms get worse.  Your symptoms do not get better with treatment. This information is not intended to replace advice given to you by your health care provider. Make sure you discuss any questions you have with your health care provider. Document Released: 04/10/2010 Document Revised: 08/14/2015 Document Reviewed: 08/01/2014 Elsevier Interactive Patient Education  [employer]2018 [employer]

## 2017-03-23 NOTE — Progress Notes (Signed)
Subjective:  Daniel [name] is a 84 y.o. year old very pleasant male patient who presents for/with See problem oriented charting ROS-no fever or chills.  No nausea or vomiting.  Complains of pain opening his mouth and with chewing.  Complains of dry mouth.  Past Medical History-  Patient Active Problem List   Diagnosis Date Noted  . Atrial fibrillation (HCC) 11/04/2006    Priority: High  . History of cardiovascular disorder 08/24/2006    Priority: High  . BPH (benign prostatic hyperplasia) 10/31/2013    Priority: Medium  . Dyslipidemia 08/24/2006    Priority: Medium  . GOUT 08/24/2006    Priority: Medium  . Essential hypertension 08/24/2006    Priority: Medium  . CKD (chronic kidney disease), stage III (HCC) 08/24/2006    Priority: Medium  . PULMONARY NODULE, RIGHT UPPER LOBE 11/24/2007    Priority: Low  . Long term (current) use of anticoagulants 12/01/2016  . S/P knee surgery 07/16/2014  . Bleeding nose 03/29/2014  . Epistaxis 03/28/2014  . Encounter for therapeutic drug monitoring 07/05/2013    Medications- reviewed and updated Current Outpatient Medications  Medication Sig Dispense Refill  . allopurinol (ZYLOPRIM) 300 MG tablet TAKE ONE TABLET BY MOUTH DAILY 90 tablet 2  . amLODipine (NORVASC) 5 MG tablet TAKE 1 TABLET (5 MG TOTAL) BY MOUTH DAILY. 90 tablet 2  . atorvastatin (LIPITOR) 20 MG tablet TAKE ONE TABLET BY MOUTH DAILY AT 6 PM (REPLACES SIMVASTATIN) 90 tablet 3  . brimonidine (ALPHAGAN) 0.2 % ophthalmic solution Place 1 drop into both eyes 2 (two) times daily.     Marland Kitchen guaiFENesin-codeine 100-10 MG/5ML syrup Take 5 mLs by mouth every 6 (six) hours as needed for cough. 120 mL 0  . tamsulosin (FLOMAX) 0.4 MG CAPS capsule TAKE 1 CAPSULE (0.4 MG TOTAL) BY MOUTH DAILY AFTER BREAKFAST. 90 capsule 1  . VOLTAREN 1 % GEL     . warfarin (COUMADIN) 2.5 MG tablet TAKE AS DIRECTED BY ANTICOAGULATION CLINIC 90 tablet 2   No current facility-administered medications for this  visit.     Objective: BP 120/66 (BP Location: Left Arm, Patient Position: Sitting, Cuff Size: Large)   Pulse 92   Temp 98.3 F (36.8 C) (Oral)   Ht 5\' 6"  (1.676 m)   Wt 142 lb 6.4 oz (64.6 kg)   SpO2 94%   BMI 22.98 kg/m  Gen: NAD, resting comfortably TM normal. Pharynx normal. No drainage from duct of stensen. Patient very tender to touch preauricular region down to angle of jaw- appears to have swelling and note some mild warmth as well CV:  no murmurs rubs or gallops Lungs: CTAB no crackles, wheeze, rhonchi Abdomen: soft/nontender/nondistended/normal bowel sounds. No rebound or guarding.  Ext: no edema Skin: warm, dry Neuro: CN II-XII intact, sensation and reflexes normal throughout, 5/5 muscle strength in bilateral upper and lower extremities. Normal finger to nose. Normal rapid alternating movements. No pronator drift. Normal romberg. Normal gait. Speech initially seems slurred but patient can clear up speech just seems to hurt him more in right cheek  Assessment/Plan:  Parotitis - Plan: cefTRIAXone (ROCEPHIN) injection 1 g, CBC with Differential/Platelet, Comprehensive metabolic panel S: Patient admits he does not always stay well-hydrated.  He has had a particularly dry mouth over the last week or so.  A few days ago he noted some pain in his right jaw that has been progressive since that time.  Complains of moderate to severe pain on the right side.  He has  had trouble drinking and definitely has avoided eating at least within the last 24 hours.  Never had anything like this before.  Pain now diffuse from preauricular area down to his jaw.  At times slight pain in the right forehead.  Notes significant tenderness with palpation of area noted.  I was brought into the room initially due to some slurred speech and possible facial droop. Neuro exam normal and patient states does not have his false teeth in which makes his speech different and he doesn't want to open his mouth to talk  anyway due to pain- no facial asymmetry to discuss stroke.   A/P: Appears to be parotitis.  We discussed high risk of hospitalization, particularly if he is not able to maintain adequate hydration.  We will be aggressive outpatient with ceftriaxone 1 g today as well as ciprofloxacin 500 mg twice a day and clindamycin 450 mg every 8 hours.  His GFR has been just above 30.  We had planned to get a CBC with differential and CMP today but did not appropriately instruct patient on this.  We will see if he can come back today or tomorrow but we also have scheduled follow-up on Friday (booked at 8 15- to arrive by 7 45 to work in at 8) and will use that time to check if need be.  I did tell him that if he cannot remain well-hydrated or take medicines as prescribed that he would need to proceed immediately to the emergency room.   Future Appointments  Date Time Provider Department Center  03/25/2017  8:15 AM Shelva MajesticHunter, Stephen O, MD [facility]  04/06/2017 10:30 AM [facility]  04/15/2017 11:15 AM Vivi BarrackWagoner, Matthew R, DPM [facility]    Orders Placed This Encounter  Procedures  . CBC with Differential/Platelet    Standing Status:   Future    Standing Expiration Date:   03/23/2018  . Comprehensive metabolic panel    [facility]    Standing Status:   Future    Standing Expiration Date:   03/23/2018    Meds ordered this encounter  Medications  . clindamycin (CLEOCIN) 150 MG capsule    Sig: Take 3 capsules (450 mg total) by mouth every 8 (eight) hours.    Dispense:  90 capsule    Refill:  0  . ciprofloxacin (CIPRO) 500 MG tablet    Sig: Take 1 tablet (500 mg total) by mouth 2 (two) times daily for 10 days.    Dispense:  20 tablet    Refill:  0  . cefTRIAXone (ROCEPHIN) injection 1 g    Return precautions advised.  Tana ConchStephen Hunter, MD

## 2017-03-24 ENCOUNTER — Other Ambulatory Visit (INDEPENDENT_AMBULATORY_CARE_PROVIDER_SITE_OTHER): Payer: PPO

## 2017-03-24 ENCOUNTER — Telehealth: Payer: Self-pay | Admitting: Radiology

## 2017-03-24 DIAGNOSIS — K1120 Sialoadenitis, unspecified: Secondary | ICD-10-CM

## 2017-03-24 LAB — CBC WITH DIFFERENTIAL/PLATELET
Basophils Absolute: 0.1 10*3/uL (ref 0.0–0.1)
Basophils Relative: 0.3 % (ref 0.0–3.0)
Eosinophils Absolute: 0 10*3/uL (ref 0.0–0.7)
Eosinophils Relative: 0 % (ref 0.0–5.0)
HCT: 40.2 % (ref 39.0–52.0)
Hemoglobin: 13.1 g/dL (ref 13.0–17.0)
LYMPHS ABS: 1.3 10*3/uL (ref 0.7–4.0)
Lymphocytes Relative: 6.2 % — ABNORMAL LOW (ref 12.0–46.0)
MCHC: 32.6 g/dL (ref 30.0–36.0)
MCV: 97.2 fl (ref 78.0–100.0)
MONOS PCT: 9.2 % (ref 3.0–12.0)
Monocytes Absolute: 1.9 10*3/uL — ABNORMAL HIGH (ref 0.1–1.0)
NEUTROS ABS: 17.4 10*3/uL — AB (ref 1.4–7.7)
NEUTROS PCT: 84.3 % — AB (ref 43.0–77.0)
Platelets: 247 10*3/uL (ref 150.0–400.0)
RBC: 4.13 Mil/uL — AB (ref 4.22–5.81)
RDW: 15.8 % — ABNORMAL HIGH (ref 11.5–15.5)
WBC: 20.6 10*3/uL (ref 4.0–10.5)

## 2017-03-24 LAB — COMPREHENSIVE METABOLIC PANEL
ALK PHOS: 126 U/L — AB (ref 39–117)
ALT: 12 U/L (ref 0–53)
AST: 22 U/L (ref 0–37)
Albumin: 3.7 g/dL (ref 3.5–5.2)
BUN: 27 mg/dL — ABNORMAL HIGH (ref 6–23)
CO2: 21 meq/L (ref 19–32)
Calcium: 9.2 mg/dL (ref 8.4–10.5)
Chloride: 97 mEq/L (ref 96–112)
Creatinine, Ser: 2.05 mg/dL — ABNORMAL HIGH (ref 0.40–1.50)
GFR: 33.17 mL/min — AB (ref >60.00)
GLUCOSE: 148 mg/dL — AB (ref 70–99)
POTASSIUM: 3.7 meq/L (ref 3.5–5.1)
Sodium: 130 mEq/L — ABNORMAL LOW (ref 135–145)
Total Bilirubin: 0.8 mg/dL (ref 0.2–1.2)
Total Protein: 8.1 g/dL (ref 6.0–8.3)

## 2017-03-24 LAB — BASIC METABOLIC PANEL
BUN: 27 — AB (ref 4–21)
Creatinine: 2.1 — AB (ref 0.6–1.3)
Sodium: 130 — AB (ref 137–147)

## 2017-03-24 NOTE — Telephone Encounter (Signed)
CRITICAL VALUE STICKER  CRITICAL VALUE: WBC 20.6 RECEIVER (on-site recipient of call): London SheerBailey Frizzell, RT(R)  DATE & TIME NOTIFIED: 03/24/2017 @ 1617hrs MESSENGER (representative from lab): Luellen PuckerKaren Wyndham   MD NOTIFIED:   TIME OF NOTIFICATION:  RESPONSE:

## 2017-03-24 NOTE — Telephone Encounter (Signed)
Dr. Durene CalHunter made aware of lab value. Patient is scheduled for an appointment with Dr. Durene CalHunter on 03/25/17. I spoke with patient earlier who stated his cheek felt better that he was only having pain in his neck still when laying on the pillow.

## 2017-03-25 ENCOUNTER — Encounter: Payer: Self-pay | Admitting: Family Medicine

## 2017-03-25 ENCOUNTER — Ambulatory Visit (INDEPENDENT_AMBULATORY_CARE_PROVIDER_SITE_OTHER): Payer: PPO | Admitting: Family Medicine

## 2017-03-25 VITALS — BP 118/70 | HR 85 | Temp 97.9°F | Ht 66.0 in | Wt 141.6 lb

## 2017-03-25 DIAGNOSIS — K1120 Sialoadenitis, unspecified: Secondary | ICD-10-CM

## 2017-03-25 MED ORDER — CEFTRIAXONE SODIUM 1 G IJ SOLR
1.0000 g | Freq: Once | INTRAMUSCULAR | Status: AC
  Administered 2017-03-25: 1 g via INTRAMUSCULAR

## 2017-03-25 MED ORDER — AMOXICILLIN-POT CLAVULANATE 875-125 MG PO TABS: 1.0000 | ORAL_TABLET | Freq: Two times a day (BID) | ORAL | 0 refills | Status: AC

## 2017-03-25 NOTE — Patient Instructions (Signed)
Another injection today   Stop ciprofloxacin  Start augmentin instead (doesn't cause tendon and joint pain like ciprofloxacin does)  I want you to call me on Monday to give us an update on how you are feeling. If you have fever, worsening symptoms- call our after hours line this weekend. We may have to put you back on the ciproflxacin (though I doubt this)

## 2017-03-25 NOTE — Progress Notes (Signed)
Subjective:  Daniel [name] is a 84 y.o. year old very pleasant male patient who presents for/with See problem oriented charting ROS- no fever or hills. Facial pain much better but now with some diffuse neck pain as well as right knee pain.    Past Medical History-  Patient Active Problem List   Diagnosis Date Noted  . Atrial fibrillation (HCC) 11/04/2006    Priority: High  . History of cardiovascular disorder 08/24/2006    Priority: High  . BPH (benign prostatic hyperplasia) 10/31/2013    Priority: Medium  . Dyslipidemia 08/24/2006    Priority: Medium  . GOUT 08/24/2006    Priority: Medium  . Essential hypertension 08/24/2006    Priority: Medium  . CKD (chronic kidney disease), stage III (HCC) 08/24/2006    Priority: Medium  . PULMONARY NODULE, RIGHT UPPER LOBE 11/24/2007    Priority: Low  . Long term (current) use of anticoagulants 12/01/2016  . S/P knee surgery 07/16/2014  . Bleeding nose 03/29/2014  . Epistaxis 03/28/2014  . Encounter for therapeutic drug monitoring 07/05/2013    Medications- reviewed and updated Current Outpatient Medications  Medication Sig Dispense Refill  . allopurinol (ZYLOPRIM) 300 MG tablet TAKE ONE TABLET BY MOUTH DAILY 90 tablet 2  . amLODipine (NORVASC) 5 MG tablet TAKE 1 TABLET (5 MG TOTAL) BY MOUTH DAILY. 90 tablet 2  . cipro 500mg  BID- stopped today Take 1 tablet by mouth 2 (two) times daily for 7 days. 14 tablet 0  . atorvastatin (LIPITOR) 20 MG tablet TAKE ONE TABLET BY MOUTH DAILY AT 6 PM (REPLACES SIMVASTATIN) 90 tablet 3  . brimonidine (ALPHAGAN) 0.2 % ophthalmic solution Place 1 drop into both eyes 2 (two) times daily.     . clindamycin (CLEOCIN) 150 MG capsule Take 3 capsules (450 mg total) by mouth every 8 (eight) hours. 90 capsule 0  . tamsulosin (FLOMAX) 0.4 MG CAPS capsule TAKE 1 CAPSULE (0.4 MG TOTAL) BY MOUTH DAILY AFTER BREAKFAST. 90 capsule 1  . VOLTAREN 1 % GEL     . warfarin (COUMADIN) 2.5 MG tablet TAKE AS DIRECTED BY  ANTICOAGULATION CLINIC 90 tablet 2   Current Facility-Administered Medications  Medication Dose Route Frequency Provider Last Rate Last Dose  . cefTRIAXone (ROCEPHIN) injection 1 g  1 g Intramuscular Once Shelva Majestic, MD        Objective: BP 118/70 (BP Location: Left Arm, Patient Position: Sitting, Cuff Size: Large)   Pulse 85   Temp 97.9 F (36.6 C) (Oral)   Ht 5\' 6"  (1.676 m)   Wt 141 lb 9.6 oz (64.2 kg)   SpO2 97%   BMI 22.85 kg/m  Gen: NAD, resting comfortably CV: regular rate no murmurs rubs or gallops Lungs: CTAB no crackles, wheeze, rhonchi Ext: no edema Skin: warm, dry MSK: patient reports improvement in neck pain/tension with massage on exam today- some tenderness with initially pressing. Pain along quadricep and patellar tendon.   Assessment/Plan:  Parotitis S: Patient is much improved today. Facial pain has largely resolved as has swelling and redness. Mouth still somewhat dry but drinking more water. Discussed hyponatremia on labs- he has mainly only been drinking water- we discussed doing some soft foods (getting fitted for teeth and missed appointment) and liquids with electrolytes.   That being said he has had significant pain in his right knee quadricep and patellar tendon- started after the ciprofloxacin and not along with original symptoms. Also has had neck pain since starting- feels better with massage.  A/P: Parotitis with elevated wbc of 20k on labs and mild hyponatremia due to drinking mainly water. Clinically patient much improved on cipro/clinda combination but with intolerable side effects of tendon pain in knee and neck pain- will stop the ciprofloxacin. Start augmentin in its place and give ceftriaxone injection again today for gram negative coverage. Discussed ways to get electrolytes in diet. STRICT over the weekend precautions discussed- once again he is high risk for hospitalization.   Future Appointments  Date Time Provider Department Center   04/06/2017 10:30 AM [facility]  04/15/2017 11:15 AM Ardelle AntonWagoner, Lesia SagoMatthew R, DPM [facility]    Meds ordered this encounter  Medications  . amoxicillin-clavulanate (AUGMENTIN) 875-125 MG tablet    Sig: Take 1 tablet by mouth 2 (two) times daily for 7 days.    Dispense:  14 tablet    Refill:  0  . cefTRIAXone (ROCEPHIN) injection 1 g   Return precautions advised.  Tana ConchStephen Hunter, MD

## 2017-03-28 ENCOUNTER — Encounter: Payer: Self-pay | Admitting: Family Medicine

## 2017-03-28 ENCOUNTER — Ambulatory Visit (INDEPENDENT_AMBULATORY_CARE_PROVIDER_SITE_OTHER): Payer: PPO | Admitting: Family Medicine

## 2017-03-28 VITALS — BP 110/50 | HR 48 | Temp 97.5°F | Ht 66.0 in | Wt 141.4 lb

## 2017-03-28 DIAGNOSIS — E785 Hyperlipidemia, unspecified: Secondary | ICD-10-CM

## 2017-03-28 DIAGNOSIS — N401 Enlarged prostate with lower urinary tract symptoms: Secondary | ICD-10-CM

## 2017-03-28 DIAGNOSIS — R351 Nocturia: Secondary | ICD-10-CM

## 2017-03-28 DIAGNOSIS — N183 Chronic kidney disease, stage 3 unspecified: Secondary | ICD-10-CM

## 2017-03-28 DIAGNOSIS — I4891 Unspecified atrial fibrillation: Secondary | ICD-10-CM

## 2017-03-28 DIAGNOSIS — I10 Essential (primary) hypertension: Secondary | ICD-10-CM | POA: Diagnosis not present

## 2017-03-28 NOTE — Progress Notes (Signed)
Subjective:  Daniel [name] is a 84 y.o. year old very pleasant male patient who presents for/with See problem oriented charting ROS- no fever, chills, right facial pain. Prior weakness with walking and lower leg pain now muchi mproved.    Past Medical History-  Patient Active Problem List   Diagnosis Date Noted  . Atrial fibrillation (HCC) 11/04/2006    Priority: High  . History of cardiovascular disorder 08/24/2006    Priority: High  . BPH (benign prostatic hyperplasia) 10/31/2013    Priority: Medium  . Dyslipidemia 08/24/2006    Priority: Medium  . GOUT 08/24/2006    Priority: Medium  . Essential hypertension 08/24/2006    Priority: Medium  . CKD (chronic kidney disease), stage III (HCC) 08/24/2006    Priority: Medium  . Long term (current) use of anticoagulants 12/01/2016    Priority: Low  . Encounter for therapeutic drug monitoring 07/05/2013    Priority: Low  . PULMONARY NODULE, RIGHT UPPER LOBE 11/24/2007    Priority: Low  . S/P knee surgery 07/16/2014    Medications- reviewed and updated Current Outpatient Medications  Medication Sig Dispense Refill  . allopurinol (ZYLOPRIM) 300 MG tablet TAKE ONE TABLET BY MOUTH DAILY 90 tablet 2  . amLODipine (NORVASC) 5 MG tablet TAKE 1 TABLET (5 MG TOTAL) BY MOUTH DAILY. 90 tablet 2  . amoxicillin-clavulanate (AUGMENTIN) 875-125 MG tablet Take 1 tablet by mouth 2 (two) times daily for 7 days. 14 tablet 0  . atorvastatin (LIPITOR) 20 MG tablet TAKE ONE TABLET BY MOUTH DAILY AT 6 PM (REPLACES SIMVASTATIN) 90 tablet 3  . brimonidine (ALPHAGAN) 0.2 % ophthalmic solution Place 1 drop into both eyes 2 (two) times daily.     . clindamycin (CLEOCIN) 150 MG capsule Take 3 capsules (450 mg total) by mouth every 8 (eight) hours. 90 capsule 0  . tamsulosin (FLOMAX) 0.4 MG CAPS capsule TAKE 1 CAPSULE (0.4 MG TOTAL) BY MOUTH DAILY AFTER BREAKFAST. 90 capsule 1  . VOLTAREN 1 % GEL     . warfarin (COUMADIN) 2.5 MG tablet TAKE AS DIRECTED BY  ANTICOAGULATION CLINIC 90 tablet 2   Objective: BP (!) 110/50 (BP Location: Left Arm, Patient Position: Sitting, Cuff Size: Large)   Pulse (!) 48   Temp (!) 97.5 F (36.4 C) (Oral)   Ht 5\' 6"  (1.676 m)   Wt 141 lb 6.4 oz (64.1 kg)   SpO2 95%   BMI 22.82 kg/m  Gen: NAD, resting comfortably CV: regular rate, no murmurs rubs or gallops Lungs: CTAB no crackles, wheeze, rhonchi Ext: no edema Skin: warm, dry MSK: no neck pain today, right knee improved, effusion noted in left knee with some medial joint pain.   Assessment/Plan:  Parotitis S: seen for parotitis on 03/23/17 and started on cipro/clinda combo. Improved but with tendon pain in knee and neck so we stopped ciprofloxacin. Added augmentin in its place. On both visits gave ceftriaxone injection. Mild hyponatremia last visit at 130.   Patient states since coming off cipro he has felt [name] better. He states he was barely able to walk due to right knee pain (patellar tendon and quadriceps tendon as well as swelling and pain in left knee. He is back to only a cane and improving daily. He actually called this weekend and was told to go to ER- he opted to follow up with me today instead as also wanted to go over his full medication list and reason for each medication.   A/P: Patient is much  improved. He was having SEVERE side effects on ciprofloxacin and will list as allergy- tendon pain neck, right knee and then swelling/effusion left knee- now all improving. He will finish course of antibiotics and follow up if worsening symptoms.   Essential hypertension Patient asks for clarification on medications he takes for different medical issues. We discussed amlodipine 5mg  is for blood pressure and that BP is well controlled at 110/50    Atrial fibrillation [facility](HCC) Patient continues on warfarin for anticoagulation. Not obviously in atrial fibrillation today. Not on rate control medication and HR remains very low though on 50s on my exam. Does not  follow with cardiology- consider updating EKG at next well visit.   BPH (benign prostatic hyperplasia) Compliant with flomax 0.4mg  - seems reasonable content with results of medication treatment  CKD (chronic kidney disease), stage III (HCC) GFR closer to 30- continue to monitor at least every 6 months. Adjust medicines as needed accordingly.   Dyslipidemia push for LDL under 70. He is on atorvastatin 20mg  instead of simvastatin now. Will need to update lipids at future well visit.     Future Appointments  Date Time Provider Department Center  04/06/2017 10:30 AM [facility]  04/15/2017 11:15 AM Vivi BarrackWagoner, Matthew R, DPM [facility]   Return precautions advised.  Tana ConchStephen Deziya Amero, MD

## 2017-03-28 NOTE — Assessment & Plan Note (Addendum)
push for LDL under 70. He is on atorvastatin 20mg  instead of simvastatin now. Will need to update lipids at future well visit.

## 2017-03-28 NOTE — Assessment & Plan Note (Signed)
Patient continues on warfarin for anticoagulation. Not obviously in atrial fibrillation today. Not on rate control medication and HR remains very low though on 50s on my exam. Does not follow with cardiology- consider updating EKG at next well visit.

## 2017-03-28 NOTE — Assessment & Plan Note (Signed)
GFR closer to 30- continue to monitor at least every 6 months. Adjust medicines as needed accordingly.

## 2017-03-28 NOTE — Assessment & Plan Note (Signed)
Patient asks for clarification on medications he takes for different medical issues. We discussed amlodipine 5mg  is for blood pressure and that BP is well controlled at 110/50

## 2017-03-28 NOTE — Assessment & Plan Note (Signed)
Compliant with flomax 0.4mg  - seems reasonable content with results of medication treatment

## 2017-03-28 NOTE — Patient Instructions (Addendum)
No changes today- finish the antibiotics  I think the ciprofloxacin was causing all your walking issues and pain issues- glad we stopped this- I listed it as an intolerance  Let me know if you have fever, worsening pain, other new symptoms

## 2017-03-29 NOTE — Progress Notes (Signed)
Called patient to complete this task. No answer and no option for voicemail. I will try again later today.

## 2017-03-29 NOTE — Progress Notes (Signed)
Ellie please contact the patient to get him scheduled for an annual wellness visit with Cassie at a minimum sometime in next few months- he has some immunizations he needs. Also schedule a follow up in 3-6 months.

## 2017-03-31 NOTE — Progress Notes (Signed)
Patient would like us to call him in a few months to schedule these appointments. I have made a note on his next visit as a reminder. No action required at this time.

## 2017-04-06 ENCOUNTER — Ambulatory Visit (INDEPENDENT_AMBULATORY_CARE_PROVIDER_SITE_OTHER): Payer: PPO | Admitting: Family Medicine

## 2017-04-06 ENCOUNTER — Ambulatory Visit (INDEPENDENT_AMBULATORY_CARE_PROVIDER_SITE_OTHER): Payer: PPO | Admitting: General Practice

## 2017-04-06 ENCOUNTER — Encounter: Payer: Self-pay | Admitting: Family Medicine

## 2017-04-06 VITALS — BP 110/66 | HR 69 | Temp 97.3°F | Ht 66.0 in | Wt 140.6 lb

## 2017-04-06 DIAGNOSIS — Z636 Dependent relative needing care at home: Secondary | ICD-10-CM

## 2017-04-06 DIAGNOSIS — Z7901 Long term (current) use of anticoagulants: Secondary | ICD-10-CM

## 2017-04-06 DIAGNOSIS — R21 Rash and other nonspecific skin eruption: Secondary | ICD-10-CM | POA: Diagnosis not present

## 2017-04-06 LAB — POCT INR: INR: 2.9

## 2017-04-06 NOTE — Progress Notes (Signed)
I have reviewed and agree with note, evaluation, plan.   Stephen Hunter, MD  

## 2017-04-06 NOTE — Patient Instructions (Signed)
I do suspect your rash is related to the antibiotics  Suspect this will improve within several weeks. If it does not or it gets worse please come back and see us.   I also before you leave want you to schedule a physical with me in 3-4 months so we can check in

## 2017-04-06 NOTE — Patient Instructions (Addendum)
Skip coumadin today and then continue to take 1 tablet all days except a half tablet on Wednesday and Saturday. Return in 6 weeks per patient request.

## 2017-04-06 NOTE — Progress Notes (Signed)
Subjective:  Daniel [name] is a 84 y.o. year old very pleasant male patient who presents for/with See problem oriented charting ROS-not ill appearing, no fever/chills. No new medications (other than antibiotic he recently finished). Not immunocompromised. No mucus membrane involvement.   Past Medical History-  Patient Active Problem List   Diagnosis Date Noted  . Atrial fibrillation (HCC) 11/04/2006    Priority: High  . History of cardiovascular disorder 08/24/2006    Priority: High  . BPH (benign prostatic hyperplasia) 10/31/2013    Priority: Medium  . Dyslipidemia 08/24/2006    Priority: Medium  . GOUT 08/24/2006    Priority: Medium  . Essential hypertension 08/24/2006    Priority: Medium  . CKD (chronic kidney disease), stage III (HCC) 08/24/2006    Priority: Medium  . Long term (current) use of anticoagulants 12/01/2016    Priority: Low  . Encounter for therapeutic drug monitoring 07/05/2013    Priority: Low  . PULMONARY NODULE, RIGHT UPPER LOBE 11/24/2007    Priority: Low  . Caregiver burden 04/06/2017  . S/P knee surgery 07/16/2014    Medications- reviewed and updated Current Outpatient Medications  Medication Sig Dispense Refill  . allopurinol (ZYLOPRIM) 300 MG tablet TAKE ONE TABLET BY MOUTH DAILY 90 tablet 2  . amLODipine (NORVASC) 5 MG tablet TAKE 1 TABLET (5 MG TOTAL) BY MOUTH DAILY. 90 tablet 2  . atorvastatin (LIPITOR) 20 MG tablet TAKE ONE TABLET BY MOUTH DAILY AT 6 PM (REPLACES SIMVASTATIN) 90 tablet 3  . brimonidine (ALPHAGAN) 0.2 % ophthalmic solution Place 1 drop into both eyes 2 (two) times daily.     . tamsulosin (FLOMAX) 0.4 MG CAPS capsule TAKE 1 CAPSULE (0.4 MG TOTAL) BY MOUTH DAILY AFTER BREAKFAST. 90 capsule 1  . VOLTAREN 1 % GEL     . warfarin (COUMADIN) 2.5 MG tablet TAKE AS DIRECTED BY ANTICOAGULATION CLINIC 90 tablet 2   No current facility-administered medications for this visit.     Objective: BP 110/66 (BP Location: Left Arm, Patient  Position: Sitting, Cuff Size: Large)   Pulse 69   Temp (!) 97.3 F (36.3 C) (Oral)   Ht 5\' 6"  (1.676 m)   Wt 140 lb 9.6 oz (63.8 kg)   SpO2 97%   BMI 22.69 kg/m  Gen: NAD, resting comfortably Normal oropharynx CV: regular rate, no obvious murmurs Lungs: nonlabored breathing Ext: no edema Skin: warm, dry, several erytematous macules- some with excoriation on right upper thigh, left upper thigh and back.   Assessment/Plan:  Rash S: Patient was recently treated for parotitis with both ciprofloxacin and clindamycin.  He was having severe tendon pain ciprofloxacin so we changed to Augmentin from ciprofloxacin.  I saw him about 10 days ago improving.  Since that time he has developed a rash on his bilateral legs as well as low back.  This is been pruritic at times.  He finished his last antibiotic yesterday and seems to be slightly improved today A/P: Patient was concerned this rash could be due to the antibiotics he had been on.  We discussed this certainly is possible and also has a long most likely cause.  We discussed if it fails to improve over the next few weeks or it worsens- Return to see us.  We discussed topical options which he declines.  Also not interested in treatment such as prednisone-which I think is reasonable since not really bothering him.  Caregiver burden S: Patient's wife has dementia and is no longer living with him though  he visits her frequently.  Unfortunately he found out that she has been cheating on him with 2 men at the facility.  This is been very difficult for him A/P: I spent some time counseling patient on this.  He seemed encouraged after the discussion.  He will follow-up as needed   Future Appointments  Date Time Provider Department Center  04/15/2017 11:15 AM Vivi Barrack, DPM [facility]  05/18/2017 10:30 AM [facility]  07/06/2017 11:00 AM Shelva Majestic, MD [facility]  Advised physical at next  visit  Return precautions advised.  Tana Conch, MD

## 2017-04-06 NOTE — Assessment & Plan Note (Signed)
S: Patient's wife has dementia and is no longer living with him though he visits her frequently.  Unfortunately he found out that she has been cheating on him with 2 men at the facility.  This is been very difficult for him A/P: I spent some time counseling patient on this.  He seemed encouraged after the discussion.  He will follow-up as needed

## 2017-04-10 ENCOUNTER — Other Ambulatory Visit: Payer: Self-pay | Admitting: Family Medicine

## 2017-04-15 ENCOUNTER — Encounter: Payer: Self-pay | Admitting: Podiatry

## 2017-04-15 ENCOUNTER — Ambulatory Visit: Payer: PPO | Admitting: Podiatry

## 2017-04-15 DIAGNOSIS — M79676 Pain in unspecified toe(s): Secondary | ICD-10-CM

## 2017-04-15 DIAGNOSIS — B351 Tinea unguium: Secondary | ICD-10-CM | POA: Diagnosis not present

## 2017-04-15 DIAGNOSIS — D689 Coagulation defect, unspecified: Secondary | ICD-10-CM

## 2017-04-17 NOTE — Progress Notes (Signed)
Subjective: 84 y.o. returns the office today for painful, elongated, thickened toenails which he cannot trim himself. Denies any redness or drainage around the nails. Denies any acute changes since last appointment and no new complaints today. Denies any systemic complaints such as fevers, chills, nausea, vomiting.   He is on coumadin.   Objective: AAO 3, NAD DP/PT pulses palpable, CRT less than 3 seconds Nails hypertrophic, dystrophic, elongated, brittle, discolored 10. All of the nails appear to be discolored and thickened. No open lesions or pre-ulcerative lesions are identified. No other areas of tenderness bilateral lower extremities. No overlying edema, erythema, increased warmth. No pain with calf compression, swelling, warmth, erythema.  Assessment: Patient presents with symptomatic onychomycosis  Plan: -Treatment options including alternatives, risks, complications were discussed -Nails sharply debrided 10 without complication/bleeding. -Discussed daily foot inspection. If there are any changes, to call the office immediately.  -Follow-up in 3 months or sooner if any problems are to arise. In the meantime, encouraged to call the office with any questions, concerns, changes symptoms.  Ovid CurdMatthew Dola Lunsford, DPM

## 2017-04-23 ENCOUNTER — Other Ambulatory Visit: Payer: Self-pay | Admitting: Family Medicine

## 2017-05-06 ENCOUNTER — Other Ambulatory Visit: Payer: Self-pay | Admitting: Family Medicine

## 2017-05-18 ENCOUNTER — Ambulatory Visit: Payer: PPO

## 2017-05-25 ENCOUNTER — Ambulatory Visit: Payer: PPO

## 2017-06-01 ENCOUNTER — Ambulatory Visit (INDEPENDENT_AMBULATORY_CARE_PROVIDER_SITE_OTHER): Payer: PPO | Admitting: General Practice

## 2017-06-01 DIAGNOSIS — I4891 Unspecified atrial fibrillation: Secondary | ICD-10-CM | POA: Diagnosis not present

## 2017-06-01 DIAGNOSIS — Z7901 Long term (current) use of anticoagulants: Secondary | ICD-10-CM

## 2017-06-01 LAB — POCT INR: INR: 2.7

## 2017-06-01 NOTE — Patient Instructions (Addendum)
Pre visit review using our clinic review tool, if applicable. No additional management support is needed unless otherwise documented below in the visit note.  Skip coumadin today (3/13) and then change dosage and take 1 tablet all days except a half tablet on Monday/Wednesday/Friday.  Re-check in 4 weeks.

## 2017-06-01 NOTE — Progress Notes (Signed)
I have reviewed this visit and I agree on the patient's plan of dosage and recommendations. Marwa Fuhrman, DO   

## 2017-06-21 DIAGNOSIS — H401131 Primary open-angle glaucoma, bilateral, mild stage: Secondary | ICD-10-CM | POA: Diagnosis not present

## 2017-06-29 ENCOUNTER — Ambulatory Visit (INDEPENDENT_AMBULATORY_CARE_PROVIDER_SITE_OTHER): Payer: PPO | Admitting: General Practice

## 2017-06-29 DIAGNOSIS — I4891 Unspecified atrial fibrillation: Secondary | ICD-10-CM

## 2017-06-29 DIAGNOSIS — Z7901 Long term (current) use of anticoagulants: Secondary | ICD-10-CM | POA: Diagnosis not present

## 2017-06-29 LAB — POCT INR: INR: 1.3

## 2017-06-29 NOTE — Progress Notes (Signed)
I have reviewed and agree with note, evaluation, plan. Would prefer for patient to be seen sooner but I know he has some constraints with caring for his wife.   Tana ConchStephen Hunter, MD

## 2017-06-29 NOTE — Patient Instructions (Addendum)
Pre visit review using our clinic review tool, if applicable. No additional management support is needed unless otherwise documented below in the visit note.  Take 1 tablet today and then 1 tablet all days except a half tablet on Monday/Wednesday/Friday.  Re-check in 6 weeks at patient request.

## 2017-07-06 ENCOUNTER — Encounter: Payer: Self-pay | Admitting: Family Medicine

## 2017-07-06 ENCOUNTER — Ambulatory Visit (INDEPENDENT_AMBULATORY_CARE_PROVIDER_SITE_OTHER): Payer: PPO | Admitting: Family Medicine

## 2017-07-06 VITALS — BP 112/66 | HR 73 | Temp 97.6°F | Ht 66.0 in | Wt 143.0 lb

## 2017-07-06 DIAGNOSIS — I10 Essential (primary) hypertension: Secondary | ICD-10-CM

## 2017-07-06 DIAGNOSIS — N183 Chronic kidney disease, stage 3 unspecified: Secondary | ICD-10-CM

## 2017-07-06 DIAGNOSIS — Z0000 Encounter for general adult medical examination without abnormal findings: Secondary | ICD-10-CM | POA: Diagnosis not present

## 2017-07-06 DIAGNOSIS — E785 Hyperlipidemia, unspecified: Secondary | ICD-10-CM

## 2017-07-06 DIAGNOSIS — M109 Gout, unspecified: Secondary | ICD-10-CM | POA: Diagnosis not present

## 2017-07-06 DIAGNOSIS — I4891 Unspecified atrial fibrillation: Secondary | ICD-10-CM | POA: Diagnosis not present

## 2017-07-06 DIAGNOSIS — Z23 Encounter for immunization: Secondary | ICD-10-CM

## 2017-07-06 LAB — COMPREHENSIVE METABOLIC PANEL
ALK PHOS: 194 U/L — AB (ref 39–117)
ALT: 19 U/L (ref 0–53)
AST: 32 U/L (ref 0–37)
Albumin: 3.6 g/dL (ref 3.5–5.2)
BILIRUBIN TOTAL: 0.4 mg/dL (ref 0.2–1.2)
BUN: 21 mg/dL (ref 6–23)
CO2: 25 meq/L (ref 19–32)
Calcium: 9 mg/dL (ref 8.4–10.5)
Chloride: 104 mEq/L (ref 96–112)
Creatinine, Ser: 1.67 mg/dL — ABNORMAL HIGH (ref 0.40–1.50)
GFR: 42 mL/min — ABNORMAL LOW (ref >60.00)
Glucose, Bld: 89 mg/dL (ref 70–99)
Potassium: 4.4 mEq/L (ref 3.5–5.1)
Sodium: 138 mEq/L (ref 135–145)
Total Protein: 7.5 g/dL (ref 6.0–8.3)

## 2017-07-06 LAB — POC URINALSYSI DIPSTICK (AUTOMATED)
BILIRUBIN UA: NEGATIVE
Blood, UA: NEGATIVE
Glucose, UA: NEGATIVE
KETONES UA: NEGATIVE
LEUKOCYTES UA: NEGATIVE
Nitrite, UA: NEGATIVE
Spec Grav, UA: 1.02 (ref 1.010–1.025)
Urobilinogen, UA: 0.2 E.U./dL
pH, UA: 6 (ref 5.0–8.0)

## 2017-07-06 LAB — CBC
HCT: 37.8 % — ABNORMAL LOW (ref 39.0–52.0)
Hemoglobin: 12.4 g/dL — ABNORMAL LOW (ref 13.0–17.0)
MCHC: 32.9 g/dL (ref 30.0–36.0)
MCV: 95.8 fl (ref 78.0–100.0)
PLATELETS: 268 10*3/uL (ref 150.0–400.0)
RBC: 3.94 Mil/uL — ABNORMAL LOW (ref 4.22–5.81)
RDW: 16 % — ABNORMAL HIGH (ref 11.5–15.5)
WBC: 15.6 10*3/uL — ABNORMAL HIGH (ref 4.0–10.5)

## 2017-07-06 LAB — LIPID PANEL
CHOL/HDL RATIO: 3
Cholesterol: 129 mg/dL (ref 0–200)
HDL: 41.7 mg/dL (ref >39.00)
LDL Cholesterol: 73 mg/dL (ref 0–99)
NonHDL: 87.15
Triglycerides: 70 mg/dL (ref 0.0–149.0)
VLDL: 14 mg/dL (ref 0.0–40.0)

## 2017-07-06 LAB — URIC ACID: Uric Acid, Serum: 4.1 mg/dL (ref 4.0–7.8)

## 2017-07-06 NOTE — Progress Notes (Signed)
Largely normal urine Kidneys stable to slightly better than last few checks.  One of liver tests up some (can also be related to bone marrow as well)- with the white blood cells remaining high and noting the anemia- Team lets get a pathologist smear review under leukocytosis and ferritin, iron, tibc panel under anemia. Strongly considering referring to hematology for their opinion. Anemia could be due to his mild kidney disease Cholesterol doing pretty reasonable. If stroke hadnt been due to atrial fibrillation I would consider increasing cholesterol medicine to get to maximal level for bad cholesterol under 70.  Uric acid looks great under 6.
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
## 2017-07-06 NOTE — Patient Instructions (Addendum)
Discussed could get shingrix at pharmacy. He declined for now.   We discussed updating EKG- declined for now  We discussed freezing small skin cancer on scalp- declined for now. If area gets bigger or changes- we can always freeze at later date  No changes in medicines   Please stop by lab before you go. If you cant give urine- we can cancel that test- just let us know

## 2017-07-07 ENCOUNTER — Encounter: Payer: Self-pay | Admitting: Podiatry

## 2017-07-07 ENCOUNTER — Ambulatory Visit: Payer: PPO | Admitting: Podiatry

## 2017-07-07 DIAGNOSIS — B351 Tinea unguium: Secondary | ICD-10-CM | POA: Diagnosis not present

## 2017-07-07 DIAGNOSIS — D689 Coagulation defect, unspecified: Secondary | ICD-10-CM | POA: Diagnosis not present

## 2017-07-07 DIAGNOSIS — M79676 Pain in unspecified toe(s): Secondary | ICD-10-CM

## 2017-07-07 NOTE — Progress Notes (Signed)
Subjective: 84 y.o. returns the office today for painful, elongated, thickened toenails which he cannot trim himself. Denies any redness or drainage around the nails. Denies any acute changes since last appointment and no new complaints today. Denies any systemic complaints such as fevers, chills, nausea, vomiting.   He is on coumadin.   He has no new concners.   Objective: AAO 3, NAD DP/PT pulses palpable, CRT less than 3 seconds Nails hypertrophic, dystrophic, elongated, brittle, discolored 10. All of the nails appear to be discolored and thickened. No open lesions or pre-ulcerative lesions are identified. No other areas of tenderness bilateral lower extremities. No overlying edema, erythema, increased warmth. No pain with calf compression, swelling, warmth, erythema.  Assessment: Patient presents with symptomatic onychomycosis  Plan: -Treatment options including alternatives, risks, complications were discussed -Nails sharply debrided 10 without complication/bleeding. -Discussed daily foot inspection. If there are any changes, to call the office immediately.  -Follow-up in 3 months or sooner if any problems are to arise. In the meantime, encouraged to call the office with any questions, concerns, changes symptoms.  Ovid CurdMatthew Joeph Szatkowski, DPM

## 2017-07-08 NOTE — Assessment & Plan Note (Signed)
HTN- looks great on amlodipine 5 mg

## 2017-07-08 NOTE — Assessment & Plan Note (Signed)
Gout- no flare ups on allopurinol 300mg 

## 2017-07-08 NOTE — Assessment & Plan Note (Signed)
Hyperlipidemia- reasonable control on atorvastatin . Update lipids. Prefer LDL under 70-he is so close at 75 and I will not make adjustments at this time

## 2017-07-08 NOTE — Assessment & Plan Note (Addendum)
Rate controlled today without medication.  Anticoagulated with Coumadin.  Advised EKG as has been sometime since we did this.  He declines as he is worried about cost.  He does not see cardiology

## 2017-07-13 ENCOUNTER — Telehealth: Payer: Self-pay

## 2017-07-13 NOTE — Progress Notes (Signed)
Patient declined to schedule a follow up

## 2017-07-13 NOTE — Telephone Encounter (Signed)
Spoke with Dr. Durene CalHunter and made him verbally aware. Pt shoulder call back with sx recur or if new sx develop. Pt verbalized understanding. No further action needed at this time.

## 2017-07-13 NOTE — Telephone Encounter (Signed)
Pt called with c/o dizziness.  Dizziness started after getting out of the shower this morning.  Dizziness resolved after lying down.  Coumadin was last checked 06/29/17.  Pt denies HA, visual change, nausea, chest pain, SOB.  He will continue to monitor sx and call back if dizziness comes back or if additional sx begin.   Forwarding to Dr. Durene CalHunter as Lorain ChildesFYI.

## 2017-07-15 ENCOUNTER — Other Ambulatory Visit: Payer: Self-pay

## 2017-07-15 DIAGNOSIS — D649 Anemia, unspecified: Secondary | ICD-10-CM

## 2017-07-15 DIAGNOSIS — D72829 Elevated white blood cell count, unspecified: Secondary | ICD-10-CM

## 2017-07-22 ENCOUNTER — Telehealth: Payer: Self-pay

## 2017-07-22 NOTE — Telephone Encounter (Signed)
Called patient and left a voicemail for patient to call our office back.

## 2017-07-26 ENCOUNTER — Encounter: Payer: Self-pay | Admitting: Family Medicine

## 2017-08-01 ENCOUNTER — Telehealth: Payer: Self-pay | Admitting: Family Medicine

## 2017-08-01 NOTE — Telephone Encounter (Signed)
See note.   Copied from CRM [id number]. Topic: Quick Communication - Office Called Patient >> Aug 01, 2017 12:53 PM Leafy Ro wrote: Reason for CRM:pt received his certified letter and  does not really remember the discussion prior to receiving the letter. Pt would like a nurse to call him back

## 2017-08-03 ENCOUNTER — Telehealth: Payer: Self-pay | Admitting: Family Medicine

## 2017-08-03 NOTE — Telephone Encounter (Signed)
Copied from CRM [id number]. Topic: Quick Communication - See Telephone Encounter >> Aug 03, 2017  9:06 AM Rudi Coco, NT wrote: CRM for notification. See Telephone encounter for: 08/03/17.  Pt. Calling to speak with Dr. Durene Cal or nurse about health concerns didn't want to make an appt. [phone]

## 2017-08-03 NOTE — Telephone Encounter (Signed)
See note
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
## 2017-08-04 ENCOUNTER — Ambulatory Visit (INDEPENDENT_AMBULATORY_CARE_PROVIDER_SITE_OTHER): Payer: PPO | Admitting: Family Medicine

## 2017-08-04 ENCOUNTER — Encounter: Payer: Self-pay | Admitting: *Deleted

## 2017-08-04 ENCOUNTER — Encounter: Payer: Self-pay | Admitting: Family Medicine

## 2017-08-04 ENCOUNTER — Ambulatory Visit (INDEPENDENT_AMBULATORY_CARE_PROVIDER_SITE_OTHER): Payer: PPO | Admitting: *Deleted

## 2017-08-04 VITALS — BP 120/70 | HR 71 | Ht 66.0 in | Wt 142.0 lb

## 2017-08-04 VITALS — BP 120/70 | HR 71 | Temp 97.6°F | Ht 66.0 in | Wt 142.8 lb

## 2017-08-04 DIAGNOSIS — D72829 Elevated white blood cell count, unspecified: Secondary | ICD-10-CM | POA: Diagnosis not present

## 2017-08-04 DIAGNOSIS — Z0000 Encounter for general adult medical examination without abnormal findings: Secondary | ICD-10-CM | POA: Diagnosis not present

## 2017-08-04 NOTE — Patient Instructions (Signed)
Please stop by lab before you go  We may very well refer you to the blood doctor/hematologist after seeing these results

## 2017-08-04 NOTE — Assessment & Plan Note (Signed)
S: We reviewed his elevated white count in combination with his low hemoglobin.  Of note he also had an elevated alk phosphatase to 194 which has been trending up.  We reviewed the letter I wrote patient-he was very concerned about my comments about finding another provider.  I was honest with patient that my concern was that he could have something serious going on and he was not following my advice to have this worked up.    A/P: after discussion today-patient is willing to move forward with evaluationWith lab work as listed below-CBC with differential, pathology smear review, CMP, iron panel.  We discussed would strongly consider hematology referral   it has been very difficult to get patient to come in for regular follow-up and blood work.  He has had intermittent blood work done for acute issues only- looking back these issues have been persistent over the last few years but I think we need to move forward with evaluation to be on the safe side.

## 2017-08-04 NOTE — Patient Instructions (Addendum)
Daniel [name] , Thank you for taking time to come for your Medicare Wellness Visit. I appreciate your ongoing commitment to your health goals. Please review the following plan we discussed and let me know if I can assist you in the future.   May want to review your Health care power of attorney States your DTR    Shingrix is a vaccine for the prevention of Shingles in Adults 50 and older.  If you are on Medicare, the shingrix is covered under your Part D plan, so you will take both of the vaccines in the series at your pharmacy. Please check with your benefits regarding applicable copays or out of pocket expenses.  The Shingrix is given in 2 vaccines approx 8 weeks apart. You must receive the 2nd dose prior to 6 months from receipt of the first. Please have the pharmacist print out you Immunization  dates for our office records    These are the goals we discussed: Goals    . Patient Stated     Stay maintain your health       This is a list of the screening recommended for you and due dates:  Health Maintenance  Topic Date Due  . Flu Shot  10/20/2017  . Tetanus Vaccine  09/04/2021  . Pneumonia vaccines  Completed      Fall Prevention in the Home Falls can cause injuries. They can happen to people of all ages. There are many things you can do to make your home safe and to help prevent falls. What can I do on the outside of my home?  Regularly fix the edges of walkways and driveways and fix any cracks.  Remove anything that might make you trip as you walk through a door, such as a raised step or threshold.  Trim any bushes or trees on the path to your home.  Use bright outdoor lighting.  Clear any walking paths of anything that might make someone trip, such as rocks or tools.  Regularly check to see if handrails are loose or broken. Make sure that both sides of any steps have handrails.  Any raised decks and porches should have guardrails on the edges.  Have any leaves,  snow, or ice cleared regularly.  Use sand or salt on walking paths during winter.  Clean up any spills in your garage right away. This includes oil or grease spills. What can I do in the bathroom?  Use night lights.  Install grab bars by the toilet and in the tub and shower. Do not use towel bars as grab bars.  Use non-skid mats or decals in the tub or shower.  If you need to sit down in the shower, use a plastic, non-slip stool.  Keep the floor dry. Clean up any water that spills on the floor as soon as it happens.  Remove soap buildup in the tub or shower regularly.  Attach bath mats securely with double-sided non-slip rug tape.  Do not have throw rugs and other things on the floor that can make you trip. What can I do in the bedroom?  Use night lights.  Make sure that you have a light by your bed that is easy to reach.  Do not use any sheets or blankets that are too big for your bed. They should not hang down onto the floor.  Have a firm chair that has side arms. You can use this for support while you get dressed.  Do not have throw  rugs and other things on the floor that can make you trip. What can I do in the kitchen?  Clean up any spills right away.  Avoid walking on wet floors.  Keep items that you use a lot in easy-to-reach places.  If you need to reach something above you, use a strong step stool that has a grab bar.  Keep electrical cords out of the way.  Do not use floor polish or wax that makes floors slippery. If you must use wax, use non-skid floor wax.  Do not have throw rugs and other things on the floor that can make you trip. What can I do with my stairs?  Do not leave any items on the stairs.  Make sure that there are handrails on both sides of the stairs and use them. Fix handrails that are broken or loose. Make sure that handrails are as long as the stairways.  Check any carpeting to make sure that it is firmly attached to the stairs. Fix any  carpet that is loose or worn.  Avoid having throw rugs at the top or bottom of the stairs. If you do have throw rugs, attach them to the floor with carpet tape.  Make sure that you have a light switch at the top of the stairs and the bottom of the stairs. If you do not have them, ask someone to add them for you. What else can I do to help prevent falls?  Wear shoes that: ? Do not have high heels. ? Have rubber bottoms. ? Are comfortable and fit you well. ? Are closed at the toe. Do not wear sandals.  If you use a stepladder: ? Make sure that it is fully opened. Do not climb a closed stepladder. ? Make sure that both sides of the stepladder are locked into place. ? Ask someone to hold it for you, if possible.  Clearly mark and make sure that you can see: ? Any grab bars or handrails. ? First and last steps. ? Where the edge of each step is.  Use tools that help you move around (mobility aids) if they are needed. These include: ? Canes. ? Walkers. ? Scooters. ? Crutches.  Turn on the lights when you go into a dark area. Replace any light bulbs as soon as they burn out.  Set up your furniture so you have a clear path. Avoid moving your furniture around.  If any of your floors are uneven, fix them.  If there are any pets around you, be aware of where they are.  Review your medicines with your doctor. Some medicines can make you feel dizzy. This can increase your chance of falling. Ask your doctor what other things that you can do to help prevent falls. This information is not intended to replace advice given to you by your health care provider. Make sure you discuss any questions you have with your health care provider. Document Released: 01/02/2009 Document Revised: 08/14/2015 Document Reviewed: 04/12/2014 Elsevier Interactive Patient Education  2018 [employer].   Health Maintenance, Male A healthy lifestyle and preventive care is important for your health and wellness.  Ask your health care provider about what schedule of regular examinations is right for you. What should I know about weight and diet? Eat a Healthy Diet  Eat plenty of vegetables, fruits, whole grains, low-fat dairy products, and lean protein.  Do not eat a lot of foods high in solid fats, added sugars, or salt.  Maintain a  Healthy Weight Regular exercise can help you achieve or maintain a healthy weight. You should:  Do at least 150 minutes of exercise each week. The exercise should increase your heart rate and make you sweat (moderate-intensity exercise).  Do strength-training exercises at least twice a week.  Watch Your Levels of Cholesterol and Blood Lipids  Have your blood tested for lipids and cholesterol every 5 years starting at 82 years of age. If you are at high risk for heart disease, you should start having your blood tested when you are 82 years old. You may need to have your cholesterol levels checked more often if: ? Your lipid or cholesterol levels are high. ? You are older than 82 years of age. ? You are at high risk for heart disease.  What should I know about cancer screening? Many types of cancers can be detected early and may often be prevented. Lung Cancer  You should be screened every year for lung cancer if: ? You are a current smoker who has smoked for at least 30 years. ? You are a former smoker who has quit within the past 15 years.  Talk to your health care provider about your screening options, when you should start screening, and how often you should be screened.  Colorectal Cancer  Routine colorectal cancer screening usually begins at 82 years of age and should be repeated every 5-10 years until you are 82 years old. You may need to be screened more often if early forms of precancerous polyps or small growths are found. Your health care provider may recommend screening at an earlier age if you have risk factors for colon cancer.  Your health care  provider may recommend using home test kits to check for hidden blood in the stool.  A small camera at the end of a tube can be used to examine your colon (sigmoidoscopy or colonoscopy). This checks for the earliest forms of colorectal cancer.  Prostate and Testicular Cancer  Depending on your age and overall health, your health care provider may do certain tests to screen for prostate and testicular cancer.  Talk to your health care provider about any symptoms or concerns you have about testicular or prostate cancer.  Skin Cancer  Check your skin from head to toe regularly.  Tell your health care provider about any new moles or changes in moles, especially if: ? There is a change in a mole's size, shape, or color. ? You have a mole that is larger than a pencil eraser.  Always use sunscreen. Apply sunscreen liberally and repeat throughout the day.  Protect yourself by wearing long sleeves, pants, a wide-brimmed hat, and sunglasses when outside.  What should I know about heart disease, diabetes, and high blood pressure?  If you are 15-19 years of age, have your blood pressure checked every 3-5 years. If you are 19 years of age or older, have your blood pressure checked every year. You should have your blood pressure measured twice-once when you are at a hospital or clinic, and once when you are not at a hospital or clinic. Record the average of the two measurements. To check your blood pressure when you are not at a hospital or clinic, you can use: ? An automated blood pressure machine at a pharmacy. ? A home blood pressure monitor.  Talk to your health care provider about your target blood pressure.  If you are between 46-42 years old, ask your health care provider if you should take  aspirin to prevent heart disease.  Have regular diabetes screenings by checking your fasting blood sugar level. ? If you are at a normal weight and have a low risk for diabetes, have this test once every  three years after the age of 27. ? If you are overweight and have a high risk for diabetes, consider being tested at a younger age or more often.  A one-time screening for abdominal aortic aneurysm (AAA) by ultrasound is recommended for men aged 65-75 years who are current or former smokers. What should I know about preventing infection? Hepatitis B If you have a higher risk for hepatitis B, you should be screened for this virus. Talk with your health care provider to find out if you are at risk for hepatitis B infection. Hepatitis C Blood testing is recommended for:  Everyone born from [birth year] through 1963.  Anyone with known risk factors for hepatitis C.  Sexually Transmitted Diseases (STDs)  You should be screened each year for STDs including gonorrhea and chlamydia if: ? You are sexually active and are younger than 82 years of age. ? You are older than 82 years of age and your health care provider tells you that you are at risk for this type of infection. ? Your sexual activity has changed since you were last screened and you are at an increased risk for chlamydia or gonorrhea. Ask your health care provider if you are at risk.  Talk with your health care provider about whether you are at high risk of being infected with HIV. Your health care provider may recommend a prescription medicine to help prevent HIV infection.  What else can I do?  Schedule regular health, dental, and eye exams.  Stay current with your vaccines (immunizations).  Do not use any tobacco products, such as cigarettes, chewing tobacco, and e-cigarettes. If you need help quitting, ask your health care provider.  Limit alcohol intake to no more than 2 drinks per day. One drink equals 12 ounces of beer, 5 ounces of wine, or 1 ounces of hard liquor.  Do not use street drugs.  Do not share needles.  Ask your health care provider for help if you need support or information about quitting drugs.  Tell your health  care provider if you often feel depressed.  Tell your health care provider if you have ever been abused or do not feel safe at home. This information is not intended to replace advice given to you by your health care provider. Make sure you discuss any questions you have with your health care provider. Document Released: 09/04/2007 Document Revised: 11/05/2015 Document Reviewed: 12/10/2014 Elsevier Interactive Patient Education  [employer].

## 2017-08-04 NOTE — Progress Notes (Signed)
subjective:  Daniel [name] is a 84 y.o. year old very pleasant male patient who presents for/with See problem oriented charting ROS-patient states he feels well overall.  No chest pain or shortness of breath.  No edema.  No night sweats.  No fever.  No abdominal pain.  Past Medical History-  Patient Active Problem List   Diagnosis Date Noted  . Atrial fibrillation ([place]) 11/04/2006    Priority: High  . History of cardiovascular disorder 08/24/2006    Priority: High  . BPH (benign prostatic hyperplasia) 10/31/2013    Priority: Medium  . Dyslipidemia 08/24/2006    Priority: Medium  . GOUT 08/24/2006    Priority: Medium  . Essential hypertension 08/24/2006    Priority: Medium  . CKD (chronic kidney disease), stage III ([place]) 08/24/2006    Priority: Medium  . Long term (current) use of anticoagulants 12/01/2016    Priority: Low  . Encounter for therapeutic drug monitoring 07/05/2013    Priority: Low  . PULMONARY NODULE, RIGHT UPPER LOBE 11/24/2007    Priority: Low  . Leukocytosis 08/04/2017  . Caregiver burden 04/06/2017  . S/P knee surgery 07/16/2014    Medications- reviewed and updated Current Outpatient Medications  Medication Sig Dispense Refill  . allopurinol (ZYLOPRIM) 300 MG tablet TAKE ONE TABLET BY MOUTH DAILY 90 tablet 2  . amLODipine (NORVASC) 5 MG tablet TAKE 1 TABLET (5 MG TOTAL) BY MOUTH DAILY. 90 tablet 1  . atorvastatin (LIPITOR) 20 MG tablet TAKE ONE TABLET BY MOUTH DAILY AT 6 PM (REPLACES SIMVASTATIN) 90 tablet 3  . brimonidine (ALPHAGAN) 0.2 % ophthalmic solution Place 1 drop into both eyes 2 (two) times daily.     . tamsulosin (FLOMAX) 0.4 MG CAPS capsule TAKE 1 CAPSULE (0.4 MG TOTAL) BY MOUTH DAILY AFTER BREAKFAST. 90 capsule 1  . VOLTAREN 1 % GEL     . warfarin (COUMADIN) 2.5 MG tablet TAKE AS DIRECTED BY ANTICOAGULATION CLINIC 90 tablet 1   No current facility-administered medications for this visit.     Objective: BP 120/70 (BP Location: Left Arm,  Patient Position: Sitting, Cuff Size: Large)   Pulse 71   Temp 97.6 F (36.4 C) (Oral)   Ht 5' 6"  (1.676 m)   Wt 142 lb 12.8 oz (64.8 kg)   SpO2 96%   BMI 23.05 kg/m  Gen: NAD, resting comfortably  Assessment/Plan:  Leukocytosis S: We reviewed his elevated white count in combination with his low hemoglobin.  Of note he also had an elevated alk phosphatase to 194 which has been trending up.  We reviewed the letter I wrote patient-he was very concerned about my comments about finding another provider.  I was honest with patient that my concern was that he could have something serious going on and he was not following my advice to have this worked up.    A/P: after discussion today-patient is willing to move forward with evaluationWith lab work as listed below-CBC with differential, pathology smear review, CMP, iron panel.  We discussed would strongly consider hematology referral   it has been very difficult to get patient to come in for regular follow-up and blood work.  He has had intermittent blood work done for acute issues only- looking back these issues have been persistent over the last few years but I think we need to move forward with evaluation to be on the safe side.   Future Appointments  Date Time Provider [place]  08/10/2017 10:45 AM [facility]  10/13/2017  1:15 PM Jacqualyn Posey, Bonna Gains, DPM [facility]   Lab/Order associations: Leukocytosis, unspecified type - Plan: CBC (INCLUDES DIFF/PLT) WITH PATHOLOGIST REVIEW, Iron, TIBC and Ferritin Panel, Comprehensive metabolic panel  Time Stamp The duration of face-to-face time during this visit was greater than 15 minutes. Greater than 50% of this time was spent in counseling, explanation of diagnosis, planning of further management, and/or coordination of care including discussion on reasoning for work-up, importance of follow-up, potential next steps.   Return precautions advised.   Garret Reddish, MD

## 2017-08-04 NOTE — Telephone Encounter (Signed)
Patient scheduled to see Dr. Durene Cal today

## 2017-08-04 NOTE — Telephone Encounter (Signed)
Patient scheduled to see Dr. Hunter today 

## 2017-08-04 NOTE — Progress Notes (Signed)
I have reviewed and agree with note, evaluation, plan.  See my separate note from today  Benicia Bergevin, MD  

## 2017-08-05 LAB — CBC (INCLUDES DIFF/PLT) WITH PATHOLOGIST REVIEW
BASOS ABS: 65 {cells}/uL (ref 0–200)
Basophils Relative: 0.6 %
EOS ABS: 316 {cells}/uL (ref 15–500)
Eosinophils Relative: 2.9 %
HCT: 37.8 % — ABNORMAL LOW (ref 38.5–50.0)
Hemoglobin: 13 g/dL — ABNORMAL LOW (ref 13.2–17.1)
Lymphs Abs: 3259 cells/uL (ref 850–3900)
MCH: 31.3 pg (ref 27.0–33.0)
MCHC: 34.4 g/dL (ref 32.0–36.0)
MCV: 91.1 fL (ref 80.0–100.0)
MPV: 11.3 fL (ref 7.5–12.5)
Monocytes Relative: 6.7 %
NEUTROS PCT: 59.9 %
Neutro Abs: 6529 cells/uL (ref 1500–7800)
PLATELETS: 255 10*3/uL (ref 140–400)
RBC: 4.15 10*6/uL — ABNORMAL LOW (ref 4.20–5.80)
RDW: 13.9 % (ref 11.0–15.0)
TOTAL LYMPHOCYTE: 29.9 %
WBC mixed population: 730 cells/uL (ref 200–950)
WBC: 10.9 10*3/uL — ABNORMAL HIGH (ref 3.8–10.8)

## 2017-08-05 LAB — IRON,TIBC AND FERRITIN PANEL
%SAT: 32 % (calc) (ref 15–60)
FERRITIN: 193 ng/mL (ref 20–380)
IRON: 79 ug/dL (ref 50–180)
TIBC: 250 mcg/dL (calc) (ref 250–425)

## 2017-08-05 LAB — COMPREHENSIVE METABOLIC PANEL
AG Ratio: 1 (calc) (ref 1.0–2.5)
ALKALINE PHOSPHATASE (APISO): 199 U/L — AB (ref 40–115)
ALT: 19 U/L (ref 9–46)
AST: 31 U/L (ref 10–35)
Albumin: 3.8 g/dL (ref 3.6–5.1)
BUN/Creatinine Ratio: 12 (calc) (ref 6–22)
BUN: 25 mg/dL (ref 7–25)
CHLORIDE: 103 mmol/L (ref 98–110)
CO2: 26 mmol/L (ref 20–32)
CREATININE: 2.16 mg/dL — AB (ref 0.70–1.11)
Calcium: 9.2 mg/dL (ref 8.6–10.3)
Globulin: 4 g/dL (calc) — ABNORMAL HIGH (ref 1.9–3.7)
Glucose, Bld: 79 mg/dL (ref 65–99)
Potassium: 4.1 mmol/L (ref 3.5–5.3)
Sodium: 137 mmol/L (ref 135–146)
Total Bilirubin: 0.4 mg/dL (ref 0.2–1.2)
Total Protein: 7.8 g/dL (ref 6.1–8.1)

## 2017-08-06 NOTE — Progress Notes (Signed)
White blood cells are almost in normal range. Anemia has improved. Iron stors normal. The pathologist smear review is pretty reassuring as well. With that being said- I still want you to sit down to discuss results with hematology (team please refer him under leukocytosis and anemia). The main reason I want to do this is the high alkaline phosphatase being high which can be a sign that something is going on in the bone marrow.

## 2017-08-10 ENCOUNTER — Ambulatory Visit (INDEPENDENT_AMBULATORY_CARE_PROVIDER_SITE_OTHER): Payer: PPO | Admitting: General Practice

## 2017-08-10 ENCOUNTER — Other Ambulatory Visit: Payer: Self-pay

## 2017-08-10 DIAGNOSIS — Z7901 Long term (current) use of anticoagulants: Secondary | ICD-10-CM

## 2017-08-10 DIAGNOSIS — I4891 Unspecified atrial fibrillation: Secondary | ICD-10-CM

## 2017-08-10 DIAGNOSIS — D72829 Elevated white blood cell count, unspecified: Secondary | ICD-10-CM

## 2017-08-10 DIAGNOSIS — D649 Anemia, unspecified: Secondary | ICD-10-CM

## 2017-08-10 LAB — POCT INR: INR: 1.7 — AB (ref 2.0–3.0)

## 2017-08-10 NOTE — Progress Notes (Signed)
I have reviewed and agree with note, evaluation, plan.   Semya Klinke, MD  

## 2017-08-10 NOTE — Patient Instructions (Signed)
Pre visit review using our clinic review tool, if applicable. No additional management support is needed unless otherwise documented below in the visit note.  Continue to take 1 tablet all days except a half tablet on Monday/Wednesday/Friday.  Re-check in 6 weeks at patient request.  

## 2017-08-12 ENCOUNTER — Telehealth: Payer: Self-pay | Admitting: Oncology

## 2017-08-12 ENCOUNTER — Encounter: Payer: Self-pay | Admitting: Oncology

## 2017-08-12 NOTE — Telephone Encounter (Signed)
New hematology referral received from Dr. Tana Conch for a dx leukocytosis/anemia.  Pt has been scheduled to see Clenton Pare on 6/5 at 1pm. Pt aware to arrive 30 minutes early. Letter mailed to the pt.

## 2017-08-24 ENCOUNTER — Inpatient Hospital Stay: Payer: PPO

## 2017-08-24 ENCOUNTER — Inpatient Hospital Stay: Payer: PPO | Attending: Oncology | Admitting: Oncology

## 2017-08-24 ENCOUNTER — Encounter: Payer: Self-pay | Admitting: Oncology

## 2017-08-24 VITALS — BP 140/78 | HR 79 | Temp 98.2°F | Resp 18 | Ht 66.0 in | Wt 144.5 lb

## 2017-08-24 DIAGNOSIS — D649 Anemia, unspecified: Secondary | ICD-10-CM | POA: Insufficient documentation

## 2017-08-24 DIAGNOSIS — Z86010 Personal history of colonic polyps: Secondary | ICD-10-CM | POA: Diagnosis not present

## 2017-08-24 DIAGNOSIS — Z8673 Personal history of transient ischemic attack (TIA), and cerebral infarction without residual deficits: Secondary | ICD-10-CM | POA: Insufficient documentation

## 2017-08-24 DIAGNOSIS — R748 Abnormal levels of other serum enzymes: Secondary | ICD-10-CM | POA: Diagnosis not present

## 2017-08-24 DIAGNOSIS — D72829 Elevated white blood cell count, unspecified: Secondary | ICD-10-CM

## 2017-08-24 DIAGNOSIS — N189 Chronic kidney disease, unspecified: Secondary | ICD-10-CM | POA: Diagnosis not present

## 2017-08-24 DIAGNOSIS — E785 Hyperlipidemia, unspecified: Secondary | ICD-10-CM | POA: Diagnosis not present

## 2017-08-24 DIAGNOSIS — N400 Enlarged prostate without lower urinary tract symptoms: Secondary | ICD-10-CM | POA: Insufficient documentation

## 2017-08-24 DIAGNOSIS — I129 Hypertensive chronic kidney disease with stage 1 through stage 4 chronic kidney disease, or unspecified chronic kidney disease: Secondary | ICD-10-CM | POA: Diagnosis not present

## 2017-08-24 DIAGNOSIS — Z87891 Personal history of nicotine dependence: Secondary | ICD-10-CM | POA: Insufficient documentation

## 2017-08-24 DIAGNOSIS — M109 Gout, unspecified: Secondary | ICD-10-CM | POA: Diagnosis not present

## 2017-08-24 DIAGNOSIS — I4891 Unspecified atrial fibrillation: Secondary | ICD-10-CM | POA: Diagnosis not present

## 2017-08-24 LAB — CBC WITH DIFFERENTIAL (CANCER CENTER ONLY)
BASOS ABS: 0.1 10*3/uL (ref 0.0–0.1)
BASOS PCT: 1 %
EOS ABS: 0.3 10*3/uL (ref 0.0–0.5)
Eosinophils Relative: 2 %
HEMATOCRIT: 39.7 % (ref 38.4–49.9)
Hemoglobin: 13 g/dL (ref 13.0–17.1)
Lymphocytes Relative: 28 %
Lymphs Abs: 3.3 10*3/uL (ref 0.9–3.3)
MCH: 31.2 pg (ref 27.2–33.4)
MCHC: 32.6 g/dL (ref 32.0–36.0)
MCV: 95.6 fL (ref 79.3–98.0)
Monocytes Absolute: 1 10*3/uL — ABNORMAL HIGH (ref 0.1–0.9)
Monocytes Relative: 9 %
NEUTROS ABS: 7.1 10*3/uL — AB (ref 1.5–6.5)
NEUTROS PCT: 60 %
Platelet Count: 240 10*3/uL (ref 140–400)
RBC: 4.16 MIL/uL — ABNORMAL LOW (ref 4.20–5.82)
RDW: 15.5 % — AB (ref 11.0–14.6)
WBC Count: 11.7 10*3/uL — ABNORMAL HIGH (ref 4.0–10.3)

## 2017-08-24 LAB — CMP (CANCER CENTER ONLY)
ALBUMIN: 3.6 g/dL (ref 3.5–5.0)
ALT: 33 U/L (ref 0–55)
ANION GAP: 9 (ref 3–11)
AST: 40 U/L — AB (ref 5–34)
Alkaline Phosphatase: 224 U/L — ABNORMAL HIGH (ref 40–150)
BILIRUBIN TOTAL: 0.4 mg/dL (ref 0.2–1.2)
BUN: 28 mg/dL — AB (ref 7–26)
CHLORIDE: 104 mmol/L (ref 98–109)
CO2: 24 mmol/L (ref 22–29)
Calcium: 9.5 mg/dL (ref 8.4–10.4)
Creatinine: 1.88 mg/dL — ABNORMAL HIGH (ref 0.70–1.30)
GFR, Est AFR Am: 37 mL/min — ABNORMAL LOW (ref >60)
GFR, Estimated: 32 mL/min — ABNORMAL LOW (ref >60)
GLUCOSE: 90 mg/dL (ref 70–140)
POTASSIUM: 4.1 mmol/L (ref 3.5–5.1)
SODIUM: 137 mmol/L (ref 136–145)
TOTAL PROTEIN: 8.8 g/dL — AB (ref 6.4–8.3)

## 2017-08-24 LAB — LACTATE DEHYDROGENASE: LDH: 188 U/L (ref 125–245)

## 2017-08-24 LAB — VITAMIN B12: VITAMIN B 12: 299 pg/mL (ref 180–914)

## 2017-08-24 LAB — FOLATE: Folate: 4.7 ng/mL — ABNORMAL LOW (ref >5.9)
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
## 2017-08-26 ENCOUNTER — Telehealth: Payer: Self-pay | Admitting: Oncology

## 2017-08-26 NOTE — Telephone Encounter (Signed)
I attempted to call the patient at both his home number and cell number and was unable to reach him.  Both telephone numbers did not have a voicemail that was set up.  Will reattempt to contact him later today and again early next week if unsuccessful today.

## 2017-08-26 NOTE — Telephone Encounter (Signed)
I left a voicemail for the patient to return our call to discuss his lab results.

## 2017-08-29 ENCOUNTER — Other Ambulatory Visit: Payer: Self-pay | Admitting: Oncology

## 2017-08-29 DIAGNOSIS — D72828 Other elevated white blood cell count: Secondary | ICD-10-CM

## 2017-08-29 MED ORDER — FOLIC ACID 1 MG PO TABS: 1.0000 mg | ORAL_TABLET | Freq: Every day | ORAL | 2 refills | Status: AC

## 2017-08-29 NOTE — Progress Notes (Signed)
I spoke with patient regarding his lab results.  He was notified that he needs to come back in for additional lab work.  We will try to do this within the next 1 to 2 days.  He will have a follow-up visit approximately 2 weeks following his lab appointment to discuss the results.  We also discussed that his folate level was low and I have sent a prescription for folic acid 1 mg daily to his pharmacy.  He was instructed to start taking this.  Scheduling message sent.  Orders Placed This Encounter  Procedures  . BCR ABL1 FISH ([facility])    Standing Status:   Future    Standing Expiration Date:   08/30/2018  . CBC with Differential ([facility] Only)    Standing Status:   Future    Standing Expiration Date:   08/30/2018

## 2017-08-30 ENCOUNTER — Telehealth: Payer: Self-pay | Admitting: Internal Medicine

## 2017-08-30 NOTE — Telephone Encounter (Signed)
Scheduled appt per 6/10 sch message - per patient request - leftmessage on vmail.

## 2017-09-02 ENCOUNTER — Other Ambulatory Visit: Payer: Self-pay | Admitting: Oncology

## 2017-09-02 ENCOUNTER — Inpatient Hospital Stay: Payer: PPO

## 2017-09-02 DIAGNOSIS — D72828 Other elevated white blood cell count: Secondary | ICD-10-CM

## 2017-09-02 DIAGNOSIS — D72829 Elevated white blood cell count, unspecified: Secondary | ICD-10-CM

## 2017-09-02 LAB — CBC WITH DIFFERENTIAL (CANCER CENTER ONLY)
Basophils Absolute: 0.1 10*3/uL (ref 0.0–0.1)
Basophils Relative: 1 %
EOS ABS: 0.2 10*3/uL (ref 0.0–0.5)
Eosinophils Relative: 2 %
HCT: 37.4 % — ABNORMAL LOW (ref 38.4–49.9)
HEMOGLOBIN: 12.5 g/dL — AB (ref 13.0–17.1)
Lymphocytes Relative: 34 %
Lymphs Abs: 3.6 10*3/uL — ABNORMAL HIGH (ref 0.9–3.3)
MCH: 32.2 pg (ref 27.2–33.4)
MCHC: 33.5 g/dL (ref 32.0–36.0)
MCV: 95.9 fL (ref 79.3–98.0)
MONO ABS: 0.7 10*3/uL (ref 0.1–0.9)
MONOS PCT: 6 %
NEUTROS PCT: 57 %
Neutro Abs: 6 10*3/uL (ref 1.5–6.5)
Platelet Count: 263 10*3/uL (ref 140–400)
RBC: 3.9 MIL/uL — ABNORMAL LOW (ref 4.20–5.82)
RDW: 15.7 % — AB (ref 11.0–14.6)
WBC Count: 10.5 10*3/uL — ABNORMAL HIGH (ref 4.0–10.3)

## 2017-09-16 ENCOUNTER — Other Ambulatory Visit: Payer: Self-pay | Admitting: Family Medicine

## 2017-09-21 ENCOUNTER — Ambulatory Visit (INDEPENDENT_AMBULATORY_CARE_PROVIDER_SITE_OTHER): Payer: PPO | Admitting: General Practice

## 2017-09-21 ENCOUNTER — Ambulatory Visit: Payer: PPO

## 2017-09-21 DIAGNOSIS — I4891 Unspecified atrial fibrillation: Secondary | ICD-10-CM

## 2017-09-21 DIAGNOSIS — Z7901 Long term (current) use of anticoagulants: Secondary | ICD-10-CM | POA: Diagnosis not present

## 2017-09-21 LAB — POCT INR: INR: 2.3 (ref 2.0–3.0)

## 2017-09-21 NOTE — Patient Instructions (Addendum)
Pre visit review using our clinic review tool, if applicable. No additional management support is needed unless otherwise documented below in the visit note.  Continue to take 1 tablet all days except a half tablet on Monday/Wednesday/Friday.  Re-check in 6 weeks at patient request.  

## 2017-09-21 NOTE — Progress Notes (Signed)
I have reviewed and agree with note, evaluation, plan.   Shanoah Asbill, MD  

## 2017-09-24 ENCOUNTER — Other Ambulatory Visit: Payer: Self-pay | Admitting: Family Medicine

## 2017-09-26 LAB — BCR ABL1 FISH (GENPATH)

## 2017-09-27 ENCOUNTER — Inpatient Hospital Stay: Payer: PPO | Attending: Oncology | Admitting: Internal Medicine

## 2017-09-27 ENCOUNTER — Telehealth: Payer: Self-pay

## 2017-09-27 ENCOUNTER — Inpatient Hospital Stay: Payer: PPO

## 2017-09-27 ENCOUNTER — Encounter: Payer: Self-pay | Admitting: Internal Medicine

## 2017-09-27 VITALS — BP 128/73 | HR 86 | Temp 97.8°F | Resp 18 | Ht 66.0 in | Wt 143.8 lb

## 2017-09-27 DIAGNOSIS — E785 Hyperlipidemia, unspecified: Secondary | ICD-10-CM | POA: Insufficient documentation

## 2017-09-27 DIAGNOSIS — N2889 Other specified disorders of kidney and ureter: Secondary | ICD-10-CM | POA: Insufficient documentation

## 2017-09-27 DIAGNOSIS — R748 Abnormal levels of other serum enzymes: Secondary | ICD-10-CM | POA: Insufficient documentation

## 2017-09-27 DIAGNOSIS — D72829 Elevated white blood cell count, unspecified: Secondary | ICD-10-CM

## 2017-09-27 DIAGNOSIS — R911 Solitary pulmonary nodule: Secondary | ICD-10-CM | POA: Diagnosis not present

## 2017-09-27 DIAGNOSIS — Z7901 Long term (current) use of anticoagulants: Secondary | ICD-10-CM | POA: Insufficient documentation

## 2017-09-27 DIAGNOSIS — M109 Gout, unspecified: Secondary | ICD-10-CM | POA: Diagnosis not present

## 2017-09-27 DIAGNOSIS — Z8673 Personal history of transient ischemic attack (TIA), and cerebral infarction without residual deficits: Secondary | ICD-10-CM | POA: Insufficient documentation

## 2017-09-27 DIAGNOSIS — Z8719 Personal history of other diseases of the digestive system: Secondary | ICD-10-CM

## 2017-09-27 DIAGNOSIS — I10 Essential (primary) hypertension: Secondary | ICD-10-CM | POA: Insufficient documentation

## 2017-09-27 DIAGNOSIS — Z86010 Personal history of colonic polyps: Secondary | ICD-10-CM | POA: Insufficient documentation

## 2017-09-27 DIAGNOSIS — Z79899 Other long term (current) drug therapy: Secondary | ICD-10-CM | POA: Diagnosis not present

## 2017-09-27 DIAGNOSIS — D509 Iron deficiency anemia, unspecified: Secondary | ICD-10-CM | POA: Insufficient documentation

## 2017-09-27 LAB — CBC WITH DIFFERENTIAL (CANCER CENTER ONLY)
BASOS ABS: 0 10*3/uL (ref 0.0–0.1)
BASOS PCT: 0 %
EOS ABS: 0.4 10*3/uL (ref 0.0–0.5)
Eosinophils Relative: 2 %
HCT: 36.7 % — ABNORMAL LOW (ref 38.4–49.9)
Hemoglobin: 12.2 g/dL — ABNORMAL LOW (ref 13.0–17.1)
Lymphocytes Relative: 18 %
Lymphs Abs: 3.4 10*3/uL — ABNORMAL HIGH (ref 0.9–3.3)
MCH: 31.7 pg (ref 27.2–33.4)
MCHC: 33.2 g/dL (ref 32.0–36.0)
MCV: 95.3 fL (ref 79.3–98.0)
Monocytes Absolute: 1.7 10*3/uL — ABNORMAL HIGH (ref 0.1–0.9)
Monocytes Relative: 9 %
Neutro Abs: 13.3 10*3/uL — ABNORMAL HIGH (ref 1.5–6.5)
Neutrophils Relative %: 71 %
Platelet Count: 279 10*3/uL (ref 140–400)
RBC: 3.85 MIL/uL — ABNORMAL LOW (ref 4.20–5.82)
RDW: 14.9 % — ABNORMAL HIGH (ref 11.0–14.6)
WBC: 18.8 10*3/uL — AB (ref 4.0–10.3)
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
## 2017-09-27 NOTE — Telephone Encounter (Signed)
Printed avs and calender of upcoming appointment. Per 7/9 los. Patient requested that both numbers be called in reference to upcoming biopsy scheduling.

## 2017-09-30 ENCOUNTER — Inpatient Hospital Stay: Payer: PPO

## 2017-09-30 ENCOUNTER — Inpatient Hospital Stay (HOSPITAL_BASED_OUTPATIENT_CLINIC_OR_DEPARTMENT_OTHER): Payer: PPO | Admitting: Hematology and Oncology

## 2017-09-30 VITALS — BP 120/74 | HR 68 | Temp 97.8°F | Resp 17

## 2017-09-30 DIAGNOSIS — D72829 Elevated white blood cell count, unspecified: Secondary | ICD-10-CM | POA: Diagnosis not present

## 2017-09-30 DIAGNOSIS — D649 Anemia, unspecified: Secondary | ICD-10-CM | POA: Diagnosis not present

## 2017-09-30 DIAGNOSIS — D7589 Other specified diseases of blood and blood-forming organs: Secondary | ICD-10-CM | POA: Diagnosis not present

## 2017-09-30 LAB — CBC WITH DIFFERENTIAL (CANCER CENTER ONLY)
BASOS ABS: 0 10*3/uL (ref 0.0–0.1)
Basophils Relative: 1 %
EOS PCT: 2 %
Eosinophils Absolute: 0.2 10*3/uL (ref 0.0–0.5)
HCT: 35.9 % — ABNORMAL LOW (ref 38.4–49.9)
Hemoglobin: 12 g/dL — ABNORMAL LOW (ref 13.0–17.1)
Lymphocytes Relative: 25 %
Lymphs Abs: 2.7 10*3/uL (ref 0.9–3.3)
MCH: 32 pg (ref 27.2–33.4)
MCHC: 33.3 g/dL (ref 32.0–36.0)
MCV: 95.9 fL (ref 79.3–98.0)
MONO ABS: 0.8 10*3/uL (ref 0.1–0.9)
Monocytes Relative: 8 %
Neutro Abs: 6.8 10*3/uL — ABNORMAL HIGH (ref 1.5–6.5)
Neutrophils Relative %: 64 %
PLATELETS: 271 10*3/uL (ref 140–400)
RBC: 3.74 MIL/uL — ABNORMAL LOW (ref 4.20–5.82)
RDW: 15.2 % — AB (ref 11.0–14.6)
WBC Count: 10.5 10*3/uL — ABNORMAL HIGH (ref 4.0–10.3)

## 2017-09-30 MED ORDER — LIDOCAINE HCL 2 % IJ SOLN
INTRAMUSCULAR | Status: AC
  Filled 2017-09-30: qty 20

## 2017-09-30 NOTE — Progress Notes (Addendum)
INDICATION: leukocytosis, refractory anemia   Bone Marrow Biopsy and Aspiration Procedure Note   Informed consent was obtained and potential risks including bleeding, infection and pain were reviewed with the patient.  The patient's name, date of birth, identification, consent and allergies were verified prior to the start of procedure and time out was performed.  The left posterior iliac crest was chosen as the site of biopsy.  The skin was prepped with ChloraPrep.   8 cc of 1% lidocaine was used to provide local anaesthesia.   10 cc of bone marrow aspirate was obtained followed by 1cm biopsy.  Pressure was applied to the biopsy site and bandage was placed over the biopsy site. Patient was made to lie on the back for 30 mins prior to discharge.  The procedure was tolerated well. COMPLICATIONS: None BLOOD LOSS: none The patient was discharged home in stable condition with a 1-2 week follow up to review results.  Patient was provided with post bone marrow biopsy instructions and instructed to call if there was any bleeding or worsening pain.  Specimens sent for flow cytometry, cytogenetics and additional studies.  Signed Scot Dock, NP  Attending note: I supervised the above bone marrow aspiration biopsy procedure from beginning to the end.  I was present from consent to the conclusion of the procedure.  There were no complications and we had a excellent sample for assessment. Patient was discharged home in stable condition.

## 2017-09-30 NOTE — Progress Notes (Signed)
Prior to discharge observed biopsy site, C/D/I, no pain, VSS. DC instructions reviewed and provided.

## 2017-09-30 NOTE — Patient Instructions (Signed)
Bone Marrow Aspiration and Bone Marrow Biopsy, Adult, Care After This sheet gives you information about how to care for yourself after your procedure. Your health care provider may also give you more specific instructions. If you have problems or questions, contact your health care provider. What can I expect after the procedure? After the procedure, it is common to have:  Mild pain and tenderness.  Swelling.  Bruising.  Follow these instructions at home:  Take over-the-counter or prescription medicines only as told by your health care provider.  Do not take baths, swim, or use a hot tub until your health care provider approves. Ask if you can take a shower or have a sponge bath.  Follow instructions from your health care provider about how to take care of the puncture site. Make sure you: ? Wash your hands with soap and water before you change your bandage (dressing). If soap and water are not available, use hand sanitizer. ? Change your dressing as told by your health care provider.  Check your puncture siteevery day for signs of infection. Check for: ? More redness, swelling, or pain. ? More fluid or blood. ? Warmth. ? Pus or a bad smell.  Return to your normal activities as told by your health care provider. Ask your health care provider what activities are safe for you.  Do not drive for 24 hours if you were given a medicine to help you relax (sedative).  Keep all follow-up visits as told by your health care provider. This is important. Contact a health care provider if:  You have more redness, swelling, or pain around the puncture site.  You have more fluid or blood coming from the puncture site.  Your puncture site feels warm to the touch.  You have pus or a bad smell coming from the puncture site.  You have a fever.  Your pain is not controlled with medicine. This information is not intended to replace advice given to you by your health care provider. Make sure  you discuss any questions you have with your health care provider. Document Released: 09/25/2004 Document Revised: 09/26/2015 Document Reviewed: 08/20/2015 Elsevier Interactive Patient Education  2018 [employer].

## 2017-10-11 NOTE — Assessment & Plan Note (Addendum)
This is a very pleasant 84 year old white male with persistent leukocytosis as well as normocytic anemia and mildly elevated alkaline phosphatase.  The patient had several studies performed recently that are not revealing.  He also had imaging studies in April 2019 that showed no concerning findings for malignancy. BCR/ABL was negative. He underwent a recent bone marrow biopsy and is here to discuss the results.  She was seen with Dr. Julien Nordmann.  Bone marrow biopsy results were discussed with the patient which showed no evidence of a myelodysplastic disorder.  His elevated white blood cell count may be reactive.  We discussed that there is no treatment needed for this. At this point, we will see the patient back on as-needed basis.  He will continue to follow-up with his primary care provider.  We will be happy to see him back in the future if needed.  He was advised to call immediately if he has any concerning symptoms in the interval. The patient voices understanding of current disease status and treatment options and is in agreement with the current care plan.  All questions were answered. The patient knows to call the clinic with any problems, questions or concerns. We can certainly see the patient much sooner if necessary.
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
## 2017-10-12 ENCOUNTER — Inpatient Hospital Stay (HOSPITAL_BASED_OUTPATIENT_CLINIC_OR_DEPARTMENT_OTHER): Payer: PPO | Admitting: Oncology

## 2017-10-12 ENCOUNTER — Inpatient Hospital Stay: Payer: PPO

## 2017-10-12 ENCOUNTER — Encounter: Payer: Self-pay | Admitting: Oncology

## 2017-10-12 VITALS — BP 104/67 | HR 80 | Temp 97.7°F | Resp 18 | Ht 66.0 in | Wt 143.1 lb

## 2017-10-12 DIAGNOSIS — N2889 Other specified disorders of kidney and ureter: Secondary | ICD-10-CM

## 2017-10-12 DIAGNOSIS — D72829 Elevated white blood cell count, unspecified: Secondary | ICD-10-CM

## 2017-10-12 DIAGNOSIS — Z7901 Long term (current) use of anticoagulants: Secondary | ICD-10-CM

## 2017-10-12 DIAGNOSIS — M109 Gout, unspecified: Secondary | ICD-10-CM

## 2017-10-12 DIAGNOSIS — Z8673 Personal history of transient ischemic attack (TIA), and cerebral infarction without residual deficits: Secondary | ICD-10-CM | POA: Diagnosis not present

## 2017-10-12 DIAGNOSIS — I10 Essential (primary) hypertension: Secondary | ICD-10-CM | POA: Diagnosis not present

## 2017-10-12 DIAGNOSIS — Z86010 Personal history of colonic polyps: Secondary | ICD-10-CM

## 2017-10-12 DIAGNOSIS — E785 Hyperlipidemia, unspecified: Secondary | ICD-10-CM

## 2017-10-12 DIAGNOSIS — R911 Solitary pulmonary nodule: Secondary | ICD-10-CM | POA: Diagnosis not present

## 2017-10-12 DIAGNOSIS — D509 Iron deficiency anemia, unspecified: Secondary | ICD-10-CM

## 2017-10-12 DIAGNOSIS — R748 Abnormal levels of other serum enzymes: Secondary | ICD-10-CM | POA: Diagnosis not present

## 2017-10-12 DIAGNOSIS — Z79899 Other long term (current) drug therapy: Secondary | ICD-10-CM

## 2017-10-12 DIAGNOSIS — Z8719 Personal history of other diseases of the digestive system: Secondary | ICD-10-CM

## 2017-10-12 LAB — CBC WITH DIFFERENTIAL (CANCER CENTER ONLY)
BASOS PCT: 1 %
Basophils Absolute: 0.1 10*3/uL (ref 0.0–0.1)
EOS ABS: 0.4 10*3/uL (ref 0.0–0.5)
Eosinophils Relative: 3 %
HCT: 38.4 % (ref 38.4–49.9)
HEMOGLOBIN: 12.6 g/dL — AB (ref 13.0–17.1)
LYMPHS ABS: 3.9 10*3/uL — AB (ref 0.9–3.3)
Lymphocytes Relative: 33 %
MCH: 31.5 pg (ref 27.2–33.4)
MCHC: 32.9 g/dL (ref 32.0–36.0)
MCV: 95.7 fL (ref 79.3–98.0)
Monocytes Absolute: 1 10*3/uL — ABNORMAL HIGH (ref 0.1–0.9)
Monocytes Relative: 8 %
NEUTROS PCT: 55 %
Neutro Abs: 6.7 10*3/uL — ABNORMAL HIGH (ref 1.5–6.5)
Platelet Count: 254 10*3/uL (ref 140–400)
RBC: 4.01 MIL/uL — AB (ref 4.20–5.82)
RDW: 15.6 % — ABNORMAL HIGH (ref 11.0–14.6)
WBC Count: 12.1 10*3/uL — ABNORMAL HIGH (ref 4.0–10.3)

## 2017-10-12 LAB — CMP (CANCER CENTER ONLY)
ALBUMIN: 3.5 g/dL (ref 3.5–5.0)
ALK PHOS: 201 U/L — AB (ref 38–126)
ALT: 20 U/L (ref 0–44)
ANION GAP: 11 (ref 5–15)
AST: 29 U/L (ref 15–41)
BUN: 29 mg/dL — ABNORMAL HIGH (ref 8–23)
CO2: 22 mmol/L (ref 22–32)
Calcium: 9.4 mg/dL (ref 8.9–10.3)
Chloride: 107 mmol/L (ref 98–111)
Creatinine: 1.89 mg/dL — ABNORMAL HIGH (ref 0.61–1.24)
GFR, EST NON AFRICAN AMERICAN: 31 mL/min — AB (ref >60)
GFR, Est AFR Am: 36 mL/min — ABNORMAL LOW (ref >60)
GLUCOSE: 99 mg/dL (ref 70–99)
Potassium: 4 mmol/L (ref 3.5–5.1)
Sodium: 140 mmol/L (ref 135–145)
TOTAL PROTEIN: 8.1 g/dL (ref 6.5–8.1)
Total Bilirubin: 0.4 mg/dL (ref 0.3–1.2)

## 2017-10-13 ENCOUNTER — Ambulatory Visit: Payer: PPO | Admitting: Podiatry

## 2017-10-13 ENCOUNTER — Encounter: Payer: Self-pay | Admitting: Podiatry

## 2017-10-13 DIAGNOSIS — B351 Tinea unguium: Secondary | ICD-10-CM | POA: Diagnosis not present

## 2017-10-13 DIAGNOSIS — M79676 Pain in unspecified toe(s): Secondary | ICD-10-CM

## 2017-10-13 DIAGNOSIS — D689 Coagulation defect, unspecified: Secondary | ICD-10-CM

## 2017-10-14 ENCOUNTER — Encounter (HOSPITAL_COMMUNITY): Payer: Self-pay | Admitting: Internal Medicine

## 2017-10-16 NOTE — Progress Notes (Signed)
Subjective: 84 y.o. returns the office today for painful, elongated, thickened toenails which he cannot trim himself. Denies any redness or drainage around the nails. Denies any acute changes since last appointment and no new complaints today. Denies any systemic complaints such as fevers, chills, nausea, vomiting.   He is on coumadin.   Objective: AAO 3, NAD DP/PT pulses palpable, CRT less than 3 seconds Nails hypertrophic, dystrophic, elongated, brittle, discolored 10. All of the nails appear to be discolored and thickened. No open lesions or pre-ulcerative lesions are identified. No pain with calf compression, swelling, warmth, erythema.  Assessment: Patient presents with symptomatic onychomycosis  Plan: -Treatment options including alternatives, risks, complications were discussed -Nails sharply debrided 10 without complication/bleeding. -Discussed daily foot inspection. If there are any changes, to call the office immediately.  -Follow-up in 3 months or sooner if any problems are to arise. In the meantime, encouraged to call the office with any questions, concerns, changes symptoms.  Daniel CurdMatthew [name], DPM

## 2017-11-09 ENCOUNTER — Ambulatory Visit (INDEPENDENT_AMBULATORY_CARE_PROVIDER_SITE_OTHER): Payer: PPO | Admitting: General Practice

## 2017-11-09 DIAGNOSIS — I4891 Unspecified atrial fibrillation: Secondary | ICD-10-CM | POA: Diagnosis not present

## 2017-11-09 DIAGNOSIS — Z7901 Long term (current) use of anticoagulants: Secondary | ICD-10-CM | POA: Diagnosis not present

## 2017-11-09 LAB — POCT INR: INR: 1.8 — AB (ref 2.0–3.0)

## 2017-11-09 NOTE — Progress Notes (Signed)
I have reviewed and agree with note, evaluation, plan.   Kalecia Hartney, MD  

## 2017-11-09 NOTE — Patient Instructions (Addendum)
Pre visit review using our clinic review tool, if applicable. No additional management support is needed unless otherwise documented below in the visit note.  Continue to take 1 tablet all days except a half tablet on Monday/Wednesday/Friday.  Re-check in 6 weeks at patient request.  

## 2017-11-12 ENCOUNTER — Other Ambulatory Visit: Payer: Self-pay | Admitting: Family Medicine

## 2017-11-12 ENCOUNTER — Other Ambulatory Visit: Payer: Self-pay | Admitting: Internal Medicine

## 2017-11-14 ENCOUNTER — Other Ambulatory Visit: Payer: Self-pay | Admitting: General Practice

## 2017-11-14 MED ORDER — WARFARIN SODIUM 2.5 MG PO TABS: ORAL_TABLET | ORAL | 1 refills | Status: DC

## 2017-11-16 ENCOUNTER — Other Ambulatory Visit: Payer: Self-pay | Admitting: *Deleted

## 2017-11-16 MED ORDER — ALLOPURINOL 300 MG PO TABS: 300.0000 mg | ORAL_TABLET | Freq: Every day | ORAL | 1 refills | Status: DC

## 2017-11-20 ENCOUNTER — Emergency Department (HOSPITAL_COMMUNITY): Payer: PPO

## 2017-11-20 ENCOUNTER — Other Ambulatory Visit: Payer: Self-pay

## 2017-11-20 ENCOUNTER — Encounter (HOSPITAL_COMMUNITY): Payer: Self-pay

## 2017-11-20 ENCOUNTER — Observation Stay (HOSPITAL_COMMUNITY)
Admission: EM | Admit: 2017-11-20 | Discharge: 2017-11-22 | Disposition: A | Discharge status: Home / Self Care | Payer: PPO | Attending: Internal Medicine | Admitting: Internal Medicine

## 2017-11-20 DIAGNOSIS — N183 Chronic kidney disease, stage 3 unspecified: Secondary | ICD-10-CM | POA: Diagnosis present

## 2017-11-20 DIAGNOSIS — G9340 Encephalopathy, unspecified: Secondary | ICD-10-CM | POA: Diagnosis not present

## 2017-11-20 DIAGNOSIS — M25561 Pain in right knee: Secondary | ICD-10-CM | POA: Diagnosis not present

## 2017-11-20 DIAGNOSIS — I129 Hypertensive chronic kidney disease with stage 1 through stage 4 chronic kidney disease, or unspecified chronic kidney disease: Secondary | ICD-10-CM | POA: Insufficient documentation

## 2017-11-20 DIAGNOSIS — Z8249 Family history of ischemic heart disease and other diseases of the circulatory system: Secondary | ICD-10-CM | POA: Insufficient documentation

## 2017-11-20 DIAGNOSIS — G3189 Other specified degenerative diseases of nervous system: Secondary | ICD-10-CM | POA: Diagnosis not present

## 2017-11-20 DIAGNOSIS — R05 Cough: Secondary | ICD-10-CM | POA: Diagnosis not present

## 2017-11-20 DIAGNOSIS — E785 Hyperlipidemia, unspecified: Secondary | ICD-10-CM | POA: Diagnosis not present

## 2017-11-20 DIAGNOSIS — Z881 Allergy status to other antibiotic agents status: Secondary | ICD-10-CM | POA: Diagnosis not present

## 2017-11-20 DIAGNOSIS — M25562 Pain in left knee: Secondary | ICD-10-CM | POA: Diagnosis not present

## 2017-11-20 DIAGNOSIS — I350 Nonrheumatic aortic (valve) stenosis: Secondary | ICD-10-CM | POA: Diagnosis not present

## 2017-11-20 DIAGNOSIS — R55 Syncope and collapse: Secondary | ICD-10-CM | POA: Diagnosis not present

## 2017-11-20 DIAGNOSIS — Z87891 Personal history of nicotine dependence: Secondary | ICD-10-CM | POA: Insufficient documentation

## 2017-11-20 DIAGNOSIS — T68XXXA Hypothermia, initial encounter: Principal | ICD-10-CM | POA: Insufficient documentation

## 2017-11-20 DIAGNOSIS — Z8673 Personal history of transient ischemic attack (TIA), and cerebral infarction without residual deficits: Secondary | ICD-10-CM | POA: Insufficient documentation

## 2017-11-20 DIAGNOSIS — Z79899 Other long term (current) drug therapy: Secondary | ICD-10-CM | POA: Diagnosis not present

## 2017-11-20 DIAGNOSIS — Z7901 Long term (current) use of anticoagulants: Secondary | ICD-10-CM

## 2017-11-20 DIAGNOSIS — M25569 Pain in unspecified knee: Secondary | ICD-10-CM

## 2017-11-20 DIAGNOSIS — R9082 White matter disease, unspecified: Secondary | ICD-10-CM | POA: Diagnosis not present

## 2017-11-20 DIAGNOSIS — R42 Dizziness and giddiness: Secondary | ICD-10-CM | POA: Diagnosis not present

## 2017-11-20 DIAGNOSIS — M109 Gout, unspecified: Secondary | ICD-10-CM | POA: Diagnosis not present

## 2017-11-20 DIAGNOSIS — N400 Enlarged prostate without lower urinary tract symptoms: Secondary | ICD-10-CM

## 2017-11-20 DIAGNOSIS — J320 Chronic maxillary sinusitis: Secondary | ICD-10-CM | POA: Insufficient documentation

## 2017-11-20 DIAGNOSIS — I480 Paroxysmal atrial fibrillation: Secondary | ICD-10-CM | POA: Diagnosis not present

## 2017-11-20 DIAGNOSIS — M25462 Effusion, left knee: Secondary | ICD-10-CM | POA: Diagnosis not present

## 2017-11-20 DIAGNOSIS — I10 Essential (primary) hypertension: Secondary | ICD-10-CM | POA: Diagnosis present

## 2017-11-20 DIAGNOSIS — R61 Generalized hyperhidrosis: Secondary | ICD-10-CM | POA: Diagnosis not present

## 2017-11-20 DIAGNOSIS — I4891 Unspecified atrial fibrillation: Secondary | ICD-10-CM | POA: Diagnosis present

## 2017-11-20 DIAGNOSIS — R1111 Vomiting without nausea: Secondary | ICD-10-CM | POA: Diagnosis not present

## 2017-11-20 LAB — URINALYSIS, ROUTINE W REFLEX MICROSCOPIC
BILIRUBIN URINE: NEGATIVE
GLUCOSE, UA: NEGATIVE mg/dL
Ketones, ur: NEGATIVE mg/dL
LEUKOCYTES UA: NEGATIVE
NITRITE: NEGATIVE
PROTEIN: 100 mg/dL — AB
SPECIFIC GRAVITY, URINE: 1.014 (ref 1.005–1.030)
pH: 5 (ref 5.0–8.0)

## 2017-11-20 LAB — TROPONIN I: Troponin I: <0.03 ng/mL (ref <0.03)

## 2017-11-20 LAB — I-STAT CG4 LACTIC ACID, ED
LACTIC ACID, VENOUS: 3.38 mmol/L — AB (ref 0.5–1.9)
LACTIC ACID, VENOUS: 1.81 mmol/L (ref 0.5–1.9)

## 2017-11-20 LAB — BASIC METABOLIC PANEL
ANION GAP: 12 (ref 5–15)
BUN: 27 mg/dL — ABNORMAL HIGH (ref 8–23)
CHLORIDE: 106 mmol/L (ref 98–111)
CO2: 23 mmol/L (ref 22–32)
Calcium: 9.2 mg/dL (ref 8.9–10.3)
Creatinine, Ser: 2.26 mg/dL — ABNORMAL HIGH (ref 0.61–1.24)
GFR calc non Af Amer: 25 mL/min — ABNORMAL LOW (ref >60)
GFR, EST AFRICAN AMERICAN: 29 mL/min — AB (ref >60)
Glucose, Bld: 143 mg/dL — ABNORMAL HIGH (ref 70–99)
POTASSIUM: 3.4 mmol/L — AB (ref 3.5–5.1)
SODIUM: 141 mmol/L (ref 135–145)

## 2017-11-20 LAB — CBC
HEMATOCRIT: 36.1 % — AB (ref 39.0–52.0)
Hemoglobin: 12.1 g/dL — ABNORMAL LOW (ref 13.0–17.0)
MCH: 31.8 pg (ref 26.0–34.0)
MCHC: 33.5 g/dL (ref 30.0–36.0)
MCV: 95 fL (ref 78.0–100.0)
Platelets: 264 10*3/uL (ref 150–400)
RBC: 3.8 MIL/uL — AB (ref 4.22–5.81)
RDW: 15.3 % (ref 11.5–15.5)
WBC: 12.9 10*3/uL — AB (ref 4.0–10.5)

## 2017-11-20 LAB — CBG MONITORING, ED: GLUCOSE-CAPILLARY: 129 mg/dL — AB (ref 70–99)

## 2017-11-20 LAB — PROTIME-INR
INR: 2.17
Prothrombin Time: 24 seconds — ABNORMAL HIGH (ref 11.4–15.2)

## 2017-11-20 LAB — PROCALCITONIN: PROCALCITONIN: 0.11 ng/mL

## 2017-11-20 MED ORDER — ACETAMINOPHEN 325 MG PO TABS
650.0000 mg | ORAL_TABLET | Freq: Four times a day (QID) | ORAL | Status: DC | PRN
  Administered 2017-11-21 (×2): 650 mg via ORAL
  Filled 2017-11-20 (×2): qty 2

## 2017-11-20 MED ORDER — METRONIDAZOLE IN NACL 5-0.79 MG/ML-% IV SOLN
500.0000 mg | Freq: Three times a day (TID) | INTRAVENOUS | Status: DC
  Administered 2017-11-20 (×2): 500 mg via INTRAVENOUS
  Filled 2017-11-20 (×2): qty 100

## 2017-11-20 MED ORDER — FOLIC ACID 1 MG PO TABS
1.0000 mg | ORAL_TABLET | Freq: Every evening | ORAL | Status: DC
  Administered 2017-11-20 – 2017-11-21 (×2): 1 mg via ORAL
  Filled 2017-11-20 (×2): qty 1

## 2017-11-20 MED ORDER — SODIUM CHLORIDE 0.9 % IV BOLUS
1000.0000 mL | Freq: Once | INTRAVENOUS | Status: AC
  Administered 2017-11-20: 1000 mL via INTRAVENOUS

## 2017-11-20 MED ORDER — AMLODIPINE BESYLATE 5 MG PO TABS
5.0000 mg | ORAL_TABLET | Freq: Every evening | ORAL | Status: DC
  Administered 2017-11-20 – 2017-11-21 (×2): 5 mg via ORAL
  Filled 2017-11-20 (×2): qty 1

## 2017-11-20 MED ORDER — ATORVASTATIN CALCIUM 20 MG PO TABS
20.0000 mg | ORAL_TABLET | Freq: Every day | ORAL | Status: DC
  Administered 2017-11-21: 20 mg via ORAL
  Filled 2017-11-20: qty 1

## 2017-11-20 MED ORDER — ACETAMINOPHEN 650 MG RE SUPP: 650.0000 mg | Freq: Four times a day (QID) | RECTAL | Status: DC | PRN

## 2017-11-20 MED ORDER — SODIUM CHLORIDE 0.9 % IV SOLN
INTRAVENOUS | Status: DC
  Administered 2017-11-20: 19:00:00 via INTRAVENOUS

## 2017-11-20 MED ORDER — TAMSULOSIN HCL 0.4 MG PO CAPS
0.4000 mg | ORAL_CAPSULE | Freq: Every day | ORAL | Status: DC
  Administered 2017-11-21 – 2017-11-22 (×2): 0.4 mg via ORAL
  Filled 2017-11-20 (×2): qty 1

## 2017-11-20 MED ORDER — WARFARIN - PHARMACIST DOSING INPATIENT: Freq: Every day | Status: DC

## 2017-11-20 MED ORDER — SODIUM CHLORIDE 0.9% FLUSH
3.0000 mL | Freq: Two times a day (BID) | INTRAVENOUS | Status: DC
  Administered 2017-11-21 – 2017-11-22 (×3): 3 mL via INTRAVENOUS

## 2017-11-20 MED ORDER — VANCOMYCIN HCL IN DEXTROSE 1-5 GM/200ML-% IV SOLN
1000.0000 mg | Freq: Once | INTRAVENOUS | Status: AC
  Administered 2017-11-20: 1000 mg via INTRAVENOUS
  Filled 2017-11-20: qty 200

## 2017-11-20 MED ORDER — SODIUM CHLORIDE 0.9 % IV SOLN
2.0000 g | Freq: Once | INTRAVENOUS | Status: AC
  Administered 2017-11-20: 2 g via INTRAVENOUS
  Filled 2017-11-20: qty 2

## 2017-11-20 MED ORDER — SODIUM CHLORIDE 0.9 % IV SOLN
INTRAVENOUS | Status: DC
  Administered 2017-11-20: 14:00:00 via INTRAVENOUS

## 2017-11-20 MED ORDER — ALUM & MAG HYDROXIDE-SIMETH 200-200-20 MG/5ML PO SUSP: 30.0000 mL | Freq: Four times a day (QID) | ORAL | Status: DC | PRN

## 2017-11-20 MED ORDER — BRIMONIDINE TARTRATE 0.2 % OP SOLN
1.0000 [drp] | Freq: Two times a day (BID) | OPHTHALMIC | Status: DC
  Administered 2017-11-20 – 2017-11-22 (×4): 1 [drp] via OPHTHALMIC
  Filled 2017-11-20: qty 5

## 2017-11-20 NOTE — ED Triage Notes (Signed)
Pt arrived via EMS from [place] . Pt was standing to pray and had syncopal episode. Pt was reported to be drooling and staring into space. Pt was lowered to ground by [employer] and sustained no injuries. Prior to EMS transport pt was using the bathroom and had another episode of syncope with associated vomiting. Both episodes were witnessed and lasting 2-3 minutes. Pt was diaphoretic and reports poor appetite and reports recent decline in his wife's health.     EMS v/s  CBG 165, HR 60, 118/69, RR 20, O2 sat 98% RA.

## 2017-11-20 NOTE — Progress Notes (Signed)
A consult was received from an ED physician for cefepime and vancomycin per pharmacy dosing.  The patient's profile has been reviewed for ht/wt/allergies/indication/available labs.   A one time order has been placed for cefepime 2g IV and vanc 1g IV.  Further antibiotics/pharmacy consults should be ordered by admitting physician if indicated.                       Thank you, Charolotte Eke, PharmD. Mobile: [phone]. 11/20/2017,1:39 PM.
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
## 2017-11-20 NOTE — ED Notes (Signed)
Bed: ZT24 Expected date:  Expected time:  Means of arrival:  Comments: EMS - 84y/o Male w/ 2x witnessed syncopal episodes coming from church
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
## 2017-11-20 NOTE — ED Notes (Signed)
ED provider Lynelle Doctor made aware patient has critical lactic acid value of 3.38

## 2017-11-21 ENCOUNTER — Observation Stay (HOSPITAL_COMMUNITY): Payer: PPO

## 2017-11-21 ENCOUNTER — Observation Stay (HOSPITAL_BASED_OUTPATIENT_CLINIC_OR_DEPARTMENT_OTHER): Payer: PPO

## 2017-11-21 DIAGNOSIS — I351 Nonrheumatic aortic (valve) insufficiency: Secondary | ICD-10-CM | POA: Diagnosis not present

## 2017-11-21 DIAGNOSIS — R55 Syncope and collapse: Secondary | ICD-10-CM | POA: Diagnosis not present

## 2017-11-21 LAB — BASIC METABOLIC PANEL
Anion gap: 9 (ref 5–15)
BUN: 20 mg/dL (ref 8–23)
CO2: 22 mmol/L (ref 22–32)
CREATININE: 1.88 mg/dL — AB (ref 0.61–1.24)
Calcium: 9.1 mg/dL (ref 8.9–10.3)
Chloride: 110 mmol/L (ref 98–111)
GFR calc Af Amer: 36 mL/min — ABNORMAL LOW (ref >60)
GFR calc non Af Amer: 31 mL/min — ABNORMAL LOW (ref >60)
GLUCOSE: 111 mg/dL — AB (ref 70–99)
Potassium: 3.8 mmol/L (ref 3.5–5.1)
Sodium: 141 mmol/L (ref 135–145)

## 2017-11-21 LAB — TROPONIN I

## 2017-11-21 LAB — CBC
HCT: 37.4 % — ABNORMAL LOW (ref 39.0–52.0)
HEMOGLOBIN: 12.7 g/dL — AB (ref 13.0–17.0)
MCH: 32.1 pg (ref 26.0–34.0)
MCHC: 34 g/dL (ref 30.0–36.0)
MCV: 94.4 fL (ref 78.0–100.0)
Platelets: 275 10*3/uL (ref 150–400)
RBC: 3.96 MIL/uL — AB (ref 4.22–5.81)
RDW: 15.2 % (ref 11.5–15.5)
WBC: 14.8 10*3/uL — AB (ref 4.0–10.5)

## 2017-11-21 LAB — ECHOCARDIOGRAM COMPLETE
HEIGHTINCHES: 66 in
WEIGHTICAEL: 2235.2 [oz_av]

## 2017-11-21 LAB — PROTIME-INR
INR: 2.19
PROTHROMBIN TIME: 24.1 s — AB (ref 11.4–15.2)

## 2017-11-21 LAB — GLUCOSE, CAPILLARY
Glucose-Capillary: 117 mg/dL — ABNORMAL HIGH (ref 70–99)
Glucose-Capillary: 101 mg/dL — ABNORMAL HIGH (ref 70–99)

## 2017-11-21 MED ORDER — ALLOPURINOL 300 MG PO TABS
300.0000 mg | ORAL_TABLET | Freq: Every evening | ORAL | Status: DC
  Administered 2017-11-21: 300 mg via ORAL
  Filled 2017-11-21: qty 1

## 2017-11-21 MED ORDER — DICLOFENAC SODIUM 1 % TD GEL
1.0000 "application " | Freq: Four times a day (QID) | TRANSDERMAL | Status: DC
  Administered 2017-11-21 – 2017-11-22 (×4): 1 via TOPICAL
  Filled 2017-11-21: qty 100

## 2017-11-21 MED ORDER — WARFARIN SODIUM 2.5 MG PO TABS
2.5000 mg | ORAL_TABLET | Freq: Once | ORAL | Status: AC
  Administered 2017-11-21: 2.5 mg via ORAL
  Filled 2017-11-21: qty 1

## 2017-11-21 MED ORDER — COLCHICINE 0.6 MG PO TABS
1.2000 mg | ORAL_TABLET | Freq: Once | ORAL | Status: AC
  Administered 2017-11-21: 1.2 mg via ORAL
  Filled 2017-11-21: qty 2

## 2017-11-21 MED ORDER — ALLOPURINOL 300 MG PO TABS: 300.0000 mg | ORAL_TABLET | Freq: Every evening | ORAL | Status: DC

## 2017-11-21 MED ORDER — TRAMADOL HCL 50 MG PO TABS
50.0000 mg | ORAL_TABLET | Freq: Two times a day (BID) | ORAL | Status: DC | PRN
  Administered 2017-11-21: 50 mg via ORAL
  Filled 2017-11-21: qty 1

## 2017-11-21 NOTE — Discharge Summary (Signed)
Physician Discharge Summary  LEMON VIEW XHB:[id number] DOB: May 18, 1932 DOA: 11/20/2017  PCP: Shelva Majestic, MD  Admit date: 11/20/2017 Discharge date: 11/22/2017  Admitted From: Home Disposition: Home  Recommendations for Outpatient Follow-up:  1. Follow up with PCP in 1-2 weeks 2. Please obtain BMP/CBC/INR in one week 3. Please follow up on the following pending results: Echocardiogram, blood culture from 11/20/2017  Home Health: No Equipment/Devices: None  Discharge Condition: Stable CODE STATUS: Full Diet recommendation: Heart Healthy    Brief/Interim Summary:  #) Syncope: Patient was admitted with a syncopal episode while standing at church.  He did have an echo which is pending however his EKG and troponins were negative.  He was felt to have most consistently orthostatic syncope from prolonged standing.  He was monitored on telemetry with no changes.  MRI showed no acute process but did show old lacunar infarcts.  #) Hypothermia: Patient was admitted noted to be hypothermic.  He initially received broad-spectrum antibiotics due to concern for sepsis however his procalcitonin was undetectable and he had no clear source for the sepsis.  Antibiotics were discontinued and patient did not grow anything from his blood cultures.  #) Bilateral knee pain: Patient was unable to ambulate due to bilateral L>>R knee pain worse on the left.  He did have a small effusion on the left.  Was prescribed his home Voltaren gel and was evaluated by physical therapy who recommended a walker. Pt was noted to have large left knee effusion. An arthrocentesis was preformed on the left on 11/22/17 with removal of 30cc of blood fluid. Pt's L knee pain improved and he was able to ambulate better.   #) Hypertension/hyperlipidemia: Patient was continued on home amlodipine, atorvastatin.  #) Gout: Patient was continued on home allopurinol.  #) Paroxysmal atrial fibrillation: Patient was continued on home  warfarin.  His INR was therapeutic.  #) BPH: Patient was continued on home tamsulosin.  Discharge Diagnoses:  Principal Problem:   Syncope Active Problems:   Dyslipidemia   Essential hypertension   Atrial fibrillation (HCC)   CKD (chronic kidney disease), stage III (HCC)   BPH (benign prostatic hyperplasia)   Long term (current) use of anticoagulants    Discharge Instructions  Discharge Instructions    Call MD for:  difficulty breathing, headache or visual disturbances   Complete by:  As directed    Call MD for:  difficulty breathing, headache or visual disturbances   Complete by:  As directed    Call MD for:  extreme fatigue   Complete by:  As directed    Call MD for:  hives   Complete by:  As directed    Call MD for:  hives   Complete by:  As directed    Call MD for:  persistant dizziness or light-headedness   Complete by:  As directed    Call MD for:  persistant nausea and vomiting   Complete by:  As directed    Call MD for:  persistant nausea and vomiting   Complete by:  As directed    Call MD for:  redness, tenderness, or signs of infection (pain, swelling, redness, odor or green/yellow discharge around incision site)   Complete by:  As directed    Call MD for:  redness, tenderness, or signs of infection (pain, swelling, redness, odor or green/yellow discharge around incision site)   Complete by:  As directed    Call MD for:  severe uncontrolled pain   Complete by:  As directed    Call MD for:  severe uncontrolled pain   Complete by:  As directed    Call MD for:  temperature >100.4   Complete by:  As directed    Call MD for:  temperature >100.4   Complete by:  As directed    Diet - low sodium heart healthy   Complete by:  As directed    Diet - low sodium heart healthy   Complete by:  As directed    Discharge instructions   Complete by:  As directed    Please follow-up with your primary care doctor in 1 to 2 weeks.   Increase activity slowly   Complete by:   As directed    Increase activity slowly   Complete by:  As directed      Allergies as of 11/22/2017      Reactions   Ciprofloxacin Other (See Comments)   Tendon pain- knee bilateral   Iron Rash      Medication List    TAKE these medications   allopurinol 300 MG tablet Commonly known as:  ZYLOPRIM Take 1 tablet (300 mg total) by mouth daily. What changed:  when to take this   amLODipine 5 MG tablet Commonly known as:  NORVASC TAKE 1 TABLET (5 MG TOTAL) BY MOUTH DAILY. What changed:  See the new instructions.   atorvastatin 20 MG tablet Commonly known as:  LIPITOR TAKE ONE TABLET BY MOUTH DAILY AT 6 PM (REPLACES SIMVASTATIN) What changed:  See the new instructions.   brimonidine 0.2 % ophthalmic solution Commonly known as:  ALPHAGAN Place 1 drop into both eyes 2 (two) times daily.   folic acid 1 MG tablet Commonly known as:  FOLVITE Take 1 tablet (1 mg total) by mouth daily. What changed:  when to take this   tamsulosin 0.4 MG Caps capsule Commonly known as:  FLOMAX TAKE 1 CAPSULE BY MOUTH DAILY AFTER BREAKFAST. What changed:  See the new instructions.   VOLTAREN 1 % Gel Generic drug:  diclofenac sodium Apply 1 application topically daily as needed (for knee pain).   warfarin 2.5 MG tablet Commonly known as:  COUMADIN Take as directed. If you are unsure how to take this medication, talk to your nurse or doctor. Original instructions:  Take 1 tablet daily or TAKE AS DIRECTED BY ANTICOAGULATION CLINIC What changed:    how much to take  how to take this  when to take this  additional instructions            Durable Medical Equipment  (From admission, onward)         Start     Ordered   11/22/17 0905  For home use only DME Dan Humphreys  Once    Question:  Patient needs a walker to treat with the following condition  Answer:  Unsteady gait   11/22/17 0904   Unscheduled  For home use only DME Walker  [facility])  Once    Question:  Patient needs a walker to  treat with the following condition  Answer:  Knee effusion, left   11/22/17 1411          Allergies  Allergen Reactions  . Ciprofloxacin Other (See Comments)    Tendon pain- knee bilateral  . Iron Rash    Consultations:  None   Procedures/Studies: Mr Brain Wo Contrast  Result Date: 11/22/2017 CLINICAL DATA:  Encephalopathy. Stage III kidney disease. Known metallic artifact. EXAM: MRI HEAD WITHOUT CONTRAST TECHNIQUE: Multiplanar,  multiecho pulse sequences of the brain and surrounding structures were obtained without intravenous contrast. COMPARISON:  CT head without contrast 07/05/2014 FINDINGS: Brain: The diffusion-weighted images demonstrate no acute or subacute infarction. No acute hemorrhage or mass lesion is present. Ventricles are of normal size. Remote lacunar infarcts are present in the cerebellum, left greater than right. A remote lacunar infarct is present in the left thalamus. Minimal periventricular white matter disease is present. Brainstem is normal. Internal auditory canals are within normal limits bilaterally. No significant extraaxial fluid collection is present. Vascular: Low is present in the major intracranial arteries. Skull and upper cervical spine: The skull base is within normal limits. The craniocervical junction is normal. The upper cervical spine is unremarkable. Sinuses/Orbits: Mild mucosal thickening is present in the inferior maxillary sinuses bilaterally. The frontal sinuses are hypoplastic. Paranasal sinuses are otherwise clear. There is fluid in left mastoid air cells. No obstructing nasopharyngeal lesion is present. Bilateral lens replacements are present. Globes and orbits are within normal limits. IMPRESSION: 1. No acute or focal lesion to explain the patient's encephalopathy. 2. Remote lacunar infarcts involving the left cerebellum and left thalamus most notably. 3. Atrophy and white matter disease are otherwise within normal limits for age. 4. Mild maxillary  sinus disease. Electronically Signed   By: Marin Roberts M.D.   On: 11/22/2017 07:36   Dg Chest Port 1 View  Result Date: 11/20/2017 CLINICAL DATA:  Pt was standing to pray and had syncopal episode. Pt was reported to be drooling and staring into space. Order for cough Hx of HTN EXAM: PORTABLE CHEST 1 VIEW COMPARISON:  07/05/2014 FINDINGS: Heart size is normal. Aorta is mildly tortuous and partially calcified. No focal consolidations or pleural effusions. No pulmonary edema. Chronic changes are seen in both shoulders. Degenerative changes are seen in thoracic spine. IMPRESSION: No evidence for acute cardiopulmonary abnormality. Electronically Signed   By: Norva Pavlov M.D.   On: 11/20/2017 14:53   Echo 11/21/2017: Pending   Subjective:   Discharge Exam: Vitals:   11/22/17 0152 11/22/17 0501  BP: 114/74 125/72  Pulse: 84 79  Resp: 12 16  Temp: 98.3 F (36.8 C) 98 F (36.7 C)  SpO2: 96% 98%   Vitals:   11/21/17 2044 11/21/17 2201 11/22/17 0152 11/22/17 0501  BP: 136/69 124/71 114/74 125/72  Pulse: 86 85 84 79  Resp: 16 12 12 16   Temp: 98.7 F (37.1 C) 98.6 F (37 C) 98.3 F (36.8 C) 98 F (36.7 C)  TempSrc: Oral Oral Oral Oral  SpO2: 96% 95% 96% 98%  Weight:    60.6 kg  Height:        General: Pt is alert, awake, not in acute distress Cardiovascular: Irregularly irregular, no murmurs Respiratory: CTA bilaterally, no wheezing, no rhonchi Abdominal: Soft, NT, ND, bowel sounds + Extremities: no edema, bilateral crepitus, small effusion on left knee    The results of significant diagnostics from this hospitalization (including imaging, microbiology, ancillary and laboratory) are listed below for reference.     Microbiology: Recent Results (from the past 240 hour(s))  Blood culture (routine x 2)     Status: None (Preliminary result)   Collection Time: 11/20/17 12:56 PM  Result Value Ref Range Status   Specimen Description   Final    BLOOD LEFT ARM Performed  at [facility], [address] [place], [place] [address]    Special Requests   Final    BAA BCAV Performed at [facility],  [address], [place], [place] [address]    Culture   Final    NO GROWTH 2 DAYS Performed at [facility], [address], [place], [place] [address]    Report Status PENDING  Incomplete  Blood culture (routine x 2)     Status: None (Preliminary result)   Collection Time: 11/20/17  1:01 PM  Result Value Ref Range Status   Specimen Description   Final    BLOOD LEFT ANTECUBITAL Performed at [facility], [address] [place], [place] [address]    Special Requests   Final    BOTTLES DRAWN AEROBIC ONLY Blood Culture adequate volume Performed at [facility], [address] [place], [place] [address]    Culture   Final    NO GROWTH 2 DAYS Performed at [facility], [address] [place], [place] [address]    Report Status PENDING  Incomplete     Labs: BNP (last 3 results) No results for input(s): BNP in the last 8760 hours. Basic Metabolic Panel: Recent Labs  Lab 11/20/17 1256 11/21/17 0623  NA 141 141  K 3.4* 3.8  CL 106 110  CO2 23 22  GLUCOSE 143* 111*  BUN 27* 20  CREATININE 2.26* 1.88*  CALCIUM 9.2 9.1   Liver Function Tests: No results for input(s): AST, ALT, ALKPHOS, BILITOT, PROT, ALBUMIN in the last 168 hours. No results for input(s): LIPASE, AMYLASE in the last 168 hours. No results for input(s): AMMONIA in the last 168 hours. CBC: Recent Labs  Lab 11/20/17 1256 11/21/17 0623  WBC 12.9* 14.8*  HGB 12.1* 12.7*  HCT 36.1* 37.4*  MCV 95.0 94.4  PLT 264 275   Cardiac Enzymes: Recent Labs  Lab 11/20/17 1256 11/20/17 1822 11/21/17 0022 11/21/17 0624  TROPONINI <0.03 <0.03 <0.03 <0.03   BNP: Invalid input(s): POCBNP CBG: Recent Labs  Lab 11/20/17 1233 11/21/17 0419 11/21/17 0802 11/22/17 0724  GLUCAP 129* 117* 101* 93    D-Dimer No results for input(s): DDIMER in the last 72 hours. Hgb A1c No results for input(s): HGBA1C in the last 72 hours. Lipid Profile No results for input(s): CHOL, HDL, LDLCALC, TRIG, CHOLHDL, LDLDIRECT in the last 72 hours. Thyroid function studies No results for input(s): TSH, T4TOTAL, T3FREE, THYROIDAB in the last 72 hours.  Invalid input(s): FREET3 Anemia work up No results for input(s): VITAMINB12, FOLATE, FERRITIN, TIBC, IRON, RETICCTPCT in the last 72 hours. Urinalysis    Component Value Date/Time   COLORURINE YELLOW 11/20/2017 1547   APPEARANCEUR CLEAR 11/20/2017 1547   LABSPEC 1.014 11/20/2017 1547   PHURINE 5.0 11/20/2017 1547   GLUCOSEU NEGATIVE 11/20/2017 1547   HGBUR SMALL (A) 11/20/2017 1547   BILIRUBINUR NEGATIVE 11/20/2017 1547   BILIRUBINUR Negative 07/06/2017 1223   KETONESUR NEGATIVE 11/20/2017 1547   PROTEINUR 100 (A) 11/20/2017 1547   UROBILINOGEN 0.2 07/06/2017 1223   UROBILINOGEN 0.2 03/28/2014 2004   NITRITE NEGATIVE 11/20/2017 1547   LEUKOCYTESUR NEGATIVE 11/20/2017 1547   Sepsis Labs Invalid input(s): PROCALCITONIN,  WBC,  LACTICIDVEN Microbiology Recent Results (from the past 240 hour(s))  Blood culture (routine x 2)     Status: None (Preliminary result)   Collection Time: 11/20/17 12:56 PM  Result Value Ref Range Status   Specimen Description   Final    BLOOD LEFT ARM Performed at [facility], [address] [place], [place] [address]    Special Requests   Final    BAA  BCAV Performed at [facility], [address] [place], [place] [address]    Culture   Final    NO GROWTH 2 DAYS Performed at [facility], [address] [place], [place] [address]    Report Status PENDING  Incomplete  Blood culture (routine x 2)     Status: None (Preliminary result)   Collection Time: 11/20/17  1:01 PM  Result Value Ref Range Status   Specimen Description   Final    BLOOD LEFT  ANTECUBITAL Performed at [facility], [address] [place], [place] [address]    Special Requests   Final    BOTTLES DRAWN AEROBIC ONLY Blood Culture adequate volume Performed at [facility], [address] [place], [place] [address]    Culture   Final    NO GROWTH 2 DAYS Performed at [facility], [address], [place], [place] [address]    Report Status PENDING  Incomplete     Time coordinating discharge: Over 30 minutes  SIGNED:   Delaine Lame, MD  [facility] 11/22/2017, 2:11 PM   If 7PM-7AM, please contact night-coverage [email or web address] Password [email or web address]

## 2017-11-21 NOTE — Care Management Note (Signed)
Case Management Note  Patient Details  Name: AVA FERRARIO MRN: [id number] Date of Birth: 11-26-1932  Subjective/Objective:  No CM needs.                  Action/Plan:d/c home.  Expected Discharge Date:  11/21/17               Expected Discharge Plan:  Home/Self Care  In-House Referral:     Discharge planning Services  CM Consult  Post Acute Care Choice:    Choice offered to:     DME Arranged:    DME Agency:     HH Arranged:    HH Agency:     Status of Service:  Completed, signed off  If discussed at [employer] of Stay Meetings, dates discussed:    Additional Comments:  Lanier Clam, RN 11/21/2017, 10:47 AM

## 2017-11-21 NOTE — Progress Notes (Signed)
Pt was pressing for discharge today and refused xray of knees. RN spoke with Dr. Clearnce Sorrel about request. Dr. said pt cannot go home until physical therapy evaluated pt and the MRI was completed.

## 2017-11-21 NOTE — Progress Notes (Signed)
ANTICOAGULATION CONSULT NOTE  Pharmacy Consult for warfarin Indication: hx atrial fibrillation  Allergies  Allergen Reactions  . Ciprofloxacin Other (See Comments)    Tendon pain- knee bilateral  . Iron Rash    Patient Measurements: Height: 5\' 6"  (167.6 cm) Weight: 139 lb 11.2 oz (63.4 kg) IBW/kg (Calculated) : 63.8 Heparin Dosing Weight:   Vital Signs: Temp: 98 F (36.7 C) (09/02 1404) Temp Source: Oral (09/02 1404) BP: 129/77 (09/02 1723) Pulse Rate: 86 (09/02 1404)  Labs: Recent Labs    11/20/17 1256 11/20/17 1822 11/21/17 0022 11/21/17 0623 11/21/17 0624  HGB 12.1*  --   --  12.7*  --   HCT 36.1*  --   --  37.4*  --   PLT 264  --   --  275  --   LABPROT 24.0*  --   --  24.1*  --   INR 2.17  --   --  2.19  --   CREATININE 2.26*  --   --  1.88*  --   TROPONINI <0.03 <0.03 <0.03  --  <0.03    Estimated Creatinine Clearance: 26.7 mL/min (A) (by C-G formula based on SCr of 1.88 mg/dL (H)).   Medications:  PTA warfarin regimen: 2.5 mg daily except 1.25 mg on MWF  Assessment: Patient's an 84 y.o M with hx afib on warfarin PTA, presented the ED on 9/1 with c/o syncopal episode.  Warfarin resumed on admission.  Today, 11/21/2017: - INR is therapeutic at 2.19 - cbc stable - no active bleeding documented  Goal of Therapy:  INR 2-3 Monitor platelets by anticoagulation protocol: Yes   Plan:  - warfarin 2.5 mg PO x1 today since he did not get dose on 9/1 - daily INR - monitor for s/s bleeding  Louretta Tantillo P 11/21/2017,5:26 PM

## 2017-11-21 NOTE — Progress Notes (Signed)
Patient refused knee x-ray stated his Voltaren gel/cream helped his knee and he can move it better; Dr. Clearnce Sorrel notified.

## 2017-11-21 NOTE — Progress Notes (Addendum)
PROGRESS NOTE    Daniel [name]  HQI:[id number] DOB: 06-18-1932 DOA: 11/20/2017 PCP: Shelva Majestic, MD    Brief Narrative:  84 year old with past medical history relevant for stage III CKD, BPH, chronic atrial fibrillation on warfarin, hyperlipidemia, hypertension, gout who was admitted with syncopal episode while standing at church and found to be hypothermic.   Assessment & Plan:   Principal Problem:   Syncope Active Problems:   Dyslipidemia   Essential hypertension   Atrial fibrillation (HCC)   CKD (chronic kidney disease), stage III (HCC)   BPH (benign prostatic hyperplasia)   Long term (current) use of anticoagulants   #) Syncope: At this time his episode of syncope is most likely secondary to orthostasis from pulling of his blood in his legs. -Follow-up echo on 11/2017 -Telemetry -Status post IV fluids  #) Bilateral knee pain: Patient is unable to ambulate due to severe knee pain.  There is no evidence of DVT on exam.  He reports his knee pain is an exacerbation of his arthritis secondary to an antibiotic that he received. - Physical therapy consult - Pain control - Bilateral x-rays pending -See gout plan below  #)hypothermia: Patient was admitted with hypothermia.  He received initially broad-spectrum antibiotics due to concern for sepsis however it was not felt likely to be sepsis. -Discontinue IV antibiotics -Procalcitonin negative -Blood culture from 11/20/2017-  #) Paroxysmal atrial fibrillation: -Continue warfarin, pharmacy consult  #) Gout: -Continue home allopurinol 300 mg daily - We will give 1 dose of 1.2 mg of colchicine  #) Hypertension/hyperlipidemia: -Continue atorvastatin 20 mg daily -Continue amlodipine 5 mg daily  #) BPH: -Continue tamsulosin 0.4 mg daily  Fluids: Tolerating p.o. Electrolytes: Monitor and supplement Nutrition: Heart healthy diet  Prophylaxis: On warfarin  Disposition: Pending ability to walk  Full  code   Consultants:   None  Procedures:   Echo 11/21/2017: Pending  Antimicrobials:   IV cefepime, metronidazole, vancomycin 11/20/2017 to 11/21/2017   Subjective: Patient reports he is doing well.  He denies any chest pain, nausea, vomiting, diarrhea, cough, congestion, rhinorrhea.  He does report severe bilateral knee pain and is unable to ambulate due to this.  He reports that he has bilateral knee pain due to osteoarthritis however apparently was exacerbated by a medication he received.  Objective: Vitals:   11/20/17 1753 11/20/17 2010 11/21/17 0411 11/21/17 0413  BP: 132/72 114/71 (!) 149/75   Pulse: 99 86 96   Resp: 18 18 20    Temp:  98.7 F (37.1 C) 98.3 F (36.8 C)   TempSrc:  Oral Oral   SpO2: 97% 98% 95%   Weight: 64.4 kg   63.4 kg  Height: 5\' 6"  (1.676 m)       Intake/Output Summary (Last 24 hours) at 11/21/2017 1229 Last data filed at 11/21/2017 1000 Gross per 24 hour  Intake 3380.62 ml  Output 2125 ml  Net 1255.62 ml   Filed Weights   11/20/17 1753 11/21/17 0413  Weight: 64.4 kg 63.4 kg    Examination:  General exam: Appears calm and comfortable  Respiratory system: Clear to auscultation. Respiratory effort normal. Cardiovascular system: Regular rate and rhythm, no murmurs Gastrointestinal system: Abdomen is nondistended, soft and nontender. No organomegaly or masses felt. Normal bowel sounds heard. Central nervous system: Alert and oriented. No focal neurological deficits.  Bilateral flexion and extension of lower extremities limited by pain Extremities: No lower extremity edema Skin: No rashes on visible skin Psychiatry: Judgement and insight appear normal. Mood &  affect appropriate.     Data Reviewed: I have personally reviewed following labs and imaging studies  CBC: Recent Labs  Lab 11/20/17 1256 11/21/17 0623  WBC 12.9* 14.8*  HGB 12.1* 12.7*  HCT 36.1* 37.4*  MCV 95.0 94.4  PLT 264 275   Basic Metabolic Panel: Recent Labs  Lab  11/20/17 1256 11/21/17 0623  NA 141 141  K 3.4* 3.8  CL 106 110  CO2 23 22  GLUCOSE 143* 111*  BUN 27* 20  CREATININE 2.26* 1.88*  CALCIUM 9.2 9.1   GFR: Estimated Creatinine Clearance: 26.7 mL/min (A) (by C-G formula based on SCr of 1.88 mg/dL (H)). Liver Function Tests: No results for input(s): AST, ALT, ALKPHOS, BILITOT, PROT, ALBUMIN in the last 168 hours. No results for input(s): LIPASE, AMYLASE in the last 168 hours. No results for input(s): AMMONIA in the last 168 hours. Coagulation Profile: Recent Labs  Lab 11/20/17 1256 11/21/17 0623  INR 2.17 2.19   Cardiac Enzymes: Recent Labs  Lab 11/20/17 1256 11/20/17 1822 11/21/17 0022 11/21/17 0624  TROPONINI <0.03 <0.03 <0.03 <0.03   BNP (last 3 results) No results for input(s): PROBNP in the last 8760 hours. HbA1C: No results for input(s): HGBA1C in the last 72 hours. CBG: Recent Labs  Lab 11/20/17 1233 11/21/17 0419 11/21/17 0802  GLUCAP 129* 117* 101*   Lipid Profile: No results for input(s): CHOL, HDL, LDLCALC, TRIG, CHOLHDL, LDLDIRECT in the last 72 hours. Thyroid Function Tests: No results for input(s): TSH, T4TOTAL, FREET4, T3FREE, THYROIDAB in the last 72 hours. Anemia Panel: No results for input(s): VITAMINB12, FOLATE, FERRITIN, TIBC, IRON, RETICCTPCT in the last 72 hours. Sepsis Labs: Recent Labs  Lab 11/20/17 1310 11/20/17 1505 11/20/17 1810  PROCALCITON  --   --  0.11  LATICACIDVEN 3.38* 1.81  --     Recent Results (from the past 240 hour(s))  Blood culture (routine x 2)     Status: None (Preliminary result)   Collection Time: 11/20/17 12:56 PM  Result Value Ref Range Status   Specimen Description   Final    BLOOD LEFT ARM Performed at [facility], [address], [place], [place] [address]    Special Requests   Final    BAA BCAV Performed at [facility], [address] [place], [place] [address]    Culture   Final    NO GROWTH < 24  HOURS Performed at [facility], [address] [place], [place] [address]    Report Status PENDING  Incomplete  Blood culture (routine x 2)     Status: None (Preliminary result)   Collection Time: 11/20/17  1:01 PM  Result Value Ref Range Status   Specimen Description   Final    BLOOD LEFT ANTECUBITAL Performed at [facility], [address] [place], [place] [address]    Special Requests   Final    BOTTLES DRAWN AEROBIC ONLY Blood Culture adequate volume Performed at [facility], [address], [place], [place] [address]    Culture   Final    NO GROWTH < 24 HOURS Performed at [facility], [address], [place], [place] [address]    Report Status PENDING  Incomplete         Radiology Studies: Dg Chest Port 1 View  Result Date: 11/20/2017 CLINICAL DATA:  Pt was standing to pray and had syncopal episode. Pt was reported to be drooling and staring into space.  Order for cough Hx of HTN EXAM: PORTABLE CHEST 1 VIEW COMPARISON:  07/05/2014 FINDINGS: Heart size is normal. Aorta is mildly tortuous and partially calcified. No focal consolidations or pleural effusions. No pulmonary edema. Chronic changes are seen in both shoulders. Degenerative changes are seen in thoracic spine. IMPRESSION: No evidence for acute cardiopulmonary abnormality. Electronically Signed   By: Norva Pavlov M.D.   On: 11/20/2017 14:53        Scheduled Meds: . amLODipine  5 mg Oral QPM  . atorvastatin  20 mg Oral q1800  . brimonidine  1 drop Both Eyes BID  . folic acid  1 mg Oral QPM  . sodium chloride flush  3 mL Intravenous Q12H  . tamsulosin  0.4 mg Oral QPC breakfast  . Warfarin - Pharmacist Dosing Inpatient   Does not apply q1800   Continuous Infusions:   LOS: 0 days    Time spent: 35    Delaine Lame, MD [facility]  If 7PM-7AM, please contact night-coverage [email or web address] Password [facility] 11/21/2017, 12:29 PM

## 2017-11-21 NOTE — Progress Notes (Deleted)
Patient had an earlier hypoglycemic event and glucagen had to be given to bring up the blood sugar. Concerned about a repeat incident d/t cbg being 76, paged oncall provider, Craige Cotta. New orders for CBGs q4h and SSI q4h in addition to changing fluids to D5NS. Continue to monitor.

## 2017-11-21 NOTE — Evaluation (Signed)
Physical Therapy Evaluation Patient Details Name: Daniel [name] MRN: [id number] DOB: 22-Oct-1932 Today's Date: 11/21/2017   History of Present Illness  -year-old with past medical history relevant for stage III CKD, BPH, chronic atrial fibrillation on warfarin, hyperlipidemia, hypertension, gout who was admitted 11/20/17 \\with  syncopal episode while standing at church and found to be hypothermic. H/P left patella fracture  Clinical Impression  The patient ambulated with a RW. Did not attempt without. Patient declines need for RW. Reports having a cane. Patient does report bilateral knee pain. Pt admitted with above diagnosis. Pt currently with functional limitations due to the deficits listed below (see PT Problem List).  Pt will benefit from skilled PT to increase their independence and safety with mobility to allow discharge to the venue listed below.       Follow Up Recommendations No PT follow up    Equipment Recommendations  None recommended by PT    Recommendations for Other Services       Precautions / Restrictions Precautions Precautions: Fall      Mobility  Bed Mobility Overal bed mobility: Independent                Transfers Overall transfer level: Needs assistance Equipment used: Rolling walker (2 wheeled) Transfers: Sit to/from Stand Sit to Stand: Min assist         General transfer comment: extra time to rise from bed, steady assist  until upright.  Ambulation/Gait Ambulation/Gait assistance: Min assist Gait Distance (Feet): 110 Feet Assistive device: Rolling walker (2 wheeled) Gait Pattern/deviations: Step-to pattern;Antalgic     General Gait Details: several episodes of complaints of increased pain and stopped and flexed over RW. Able to proceed and continue ambulation  Stairs            Wheelchair Mobility    Modified Rankin (Stroke Patients Only)       Balance Overall balance assessment: Needs assistance         Standing  balance support: Bilateral upper extremity supported;During functional activity   Standing balance comment: is able to stand and not hole onto RW                             Pertinent Vitals/Pain Pain Assessment: 0-10 Pain Score: 8  Pain Location: right > left knee Pain Descriptors / Indicators: Sharp Pain Intervention(s): Monitored during session;Premedicated before session;Repositioned    Home Living Family/patient expects to be discharged to:: Private residence Living Arrangements: Alone Available Help at Discharge: Family Type of Home: House Home Access: Stairs to enter Entrance Stairs-Rails: Doctor, general practice of Steps: 4 Home Layout: One level Home Equipment: Cane - single point      Prior Function Level of Independence: Independent               Hand Dominance        Extremity/Trunk Assessment   Upper Extremity Assessment Upper Extremity Assessment: Overall WFL for tasks assessed    Lower Extremity Assessment Lower Extremity Assessment: RLE deficits/detail;LLE deficits/detail RLE Deficits / Details: decreased knee flexion to ~100 LLE Deficits / Details: same as  right       Communication   Communication: No difficulties  Cognition Arousal/Alertness: Awake/alert Behavior During Therapy: WFL for tasks assessed/performed Overall Cognitive Status: Within Functional Limits for tasks assessed  General Comments      Exercises     Assessment/Plan    PT Assessment Patient needs continued PT services  PT Problem List Decreased strength;Decreased range of motion;Decreased activity tolerance;Decreased balance;Decreased mobility;Decreased knowledge of precautions;Decreased safety awareness;Decreased knowledge of use of DME;Pain       PT Treatment Interventions DME instruction;Gait training;Functional mobility training;Therapeutic activities;Patient/family education    PT  Goals (Current goals can be found in the Care Plan section)  Acute Rehab PT Goals Patient Stated Goal: to go home PT Goal Formulation: With patient Time For Goal Achievement: 11/28/17 Potential to Achieve Goals: Good    Frequency Min 3X/week   Barriers to discharge        Co-evaluation               AM-PAC PT "6 Clicks" Daily Activity  Outcome Measure Difficulty turning over in bed (including adjusting bedclothes, sheets and blankets)?: None Difficulty moving from lying on back to sitting on the side of the bed? : None Difficulty sitting down on and standing up from a chair with arms (e.g., wheelchair, bedside commode, etc,.)?: A Lot Help needed moving to and from a bed to chair (including a wheelchair)?: A Lot Help needed walking in hospital room?: A Lot Help needed climbing 3-5 steps with a railing? : Total 6 Click Score: 15    End of Session   Activity Tolerance: Patient tolerated treatment well Patient left: in bed;with call bell/phone within reach;with bed alarm set Nurse Communication: Mobility status PT Visit Diagnosis: Unsteadiness on feet (R26.81);Pain Pain - Right/Left: Right Pain - part of body: Knee    Time: 0981-1914 PT Time Calculation (min) (ACUTE ONLY): 27 min   Charges:   PT Evaluation $PT Eval Low Complexity: 1 Low PT Treatments $Gait Training: 8-22 mins        11/21/2017  Daniel [name] PT [phone]       Rada Hay 11/21/2017, 12:58 PM

## 2017-11-21 NOTE — Progress Notes (Signed)
Pt states that his "knees are hurting due to an allergic reaction to the antibiotic we are giving him". Pt refused his AM dose of IV flagyl. NP on call notified. Tylenol and heat applied for knee pain. Will continue to monitor.

## 2017-11-21 NOTE — Discharge Instructions (Signed)
Syncope Syncope is when you lose temporarily pass out (faint). Signs that you may be about to pass out include:  Feeling dizzy or light-headed.  Feeling sick to your stomach (nauseous).  Seeing all white or all black.  Having cold, clammy skin.  If you passed out, get help right away. Call your local emergency services (911 in the [place] Do not drive yourself to the hospital. Follow these instructions at home: Pay attention to any changes in your symptoms. Take these actions to help with your condition:  Have someone stay with you until you feel stable.  Do not drive, use machinery, or play sports until your doctor says it is okay.  Keep all follow-up visits as told by your doctor. This is important.  If you start to feel like you might pass out, lie down right away and raise (elevate) your feet above the level of your heart. Breathe deeply and steadily. Wait until all of the symptoms are gone.  Drink enough fluid to keep your pee (urine) clear or pale yellow.  If you are taking blood pressure or heart medicine, get up slowly and spend many minutes getting ready to sit and then stand. This can help with dizziness.  Take over-the-counter and prescription medicines only as told by your doctor.  Get help right away if:  You have a very bad headache.  You have unusual pain in your chest, tummy, or back.  You are bleeding from your mouth or rectum.  You have black or tarry poop (stool).  You have a very fast or uneven heartbeat (palpitations).  It hurts to breathe.  You pass out once or more than once.  You have jerky movements that you cannot control (seizure).  You are confused.  You have trouble walking.  You are very weak.  You have vision problems. These symptoms may be an emergency. Do not wait to see if the symptoms will go away. Get medical help right away. Call your local emergency services (911 in the [place] Do not drive yourself to the hospital. This  information is not intended to replace advice given to you by your health care provider. Make sure you discuss any questions you have with your health care provider. Document Released: 08/25/2007 Document Revised: 08/14/2015 Document Reviewed: 11/20/2014 Elsevier Interactive Patient Education  2018 Elsevier Inc.  

## 2017-11-21 NOTE — Progress Notes (Signed)
  Echocardiogram 2D Echocardiogram has been performed.  Daniel [name] F 11/21/2017, 9:45 AM

## 2017-11-22 ENCOUNTER — Observation Stay (HOSPITAL_COMMUNITY): Payer: PPO

## 2017-11-22 DIAGNOSIS — G9340 Encephalopathy, unspecified: Secondary | ICD-10-CM | POA: Diagnosis not present

## 2017-11-22 DIAGNOSIS — R55 Syncope and collapse: Secondary | ICD-10-CM | POA: Diagnosis not present

## 2017-11-22 LAB — GLUCOSE, CAPILLARY: GLUCOSE-CAPILLARY: 93 mg/dL (ref 70–99)

## 2017-11-22 LAB — PROTIME-INR
INR: 2.18
PROTHROMBIN TIME: 24.1 s — AB (ref 11.4–15.2)

## 2017-11-22 MED ORDER — LIDOCAINE-EPINEPHRINE 2 %-1:100000 IJ SOLN
1.7000 mL | Freq: Once | INTRAMUSCULAR | Status: DC
  Filled 2017-11-22: qty 1.7

## 2017-11-22 MED ORDER — LIDOCAINE-EPINEPHRINE 2 %-1:50000 IJ SOLN
1.7000 mL | Freq: Once | INTRAMUSCULAR | Status: DC
  Filled 2017-11-22: qty 1.7

## 2017-11-22 MED ORDER — TRIAMCINOLONE ACETONIDE 40 MG/ML IJ SUSP
40.0000 mg | Freq: Once | INTRAMUSCULAR | Status: DC
  Filled 2017-11-22: qty 1

## 2017-11-22 MED ORDER — WARFARIN SODIUM 2 MG PO TABS
2.0000 mg | ORAL_TABLET | Freq: Once | ORAL | Status: DC
  Filled 2017-11-22: qty 1

## 2017-11-22 NOTE — Care Management Note (Signed)
Case Management Note  Patient Details  Name: Daniel [name] MRN: [id number] Date of Birth: 07-02-1932  Subjective/Objective:  Admitted w/syncope. From home alone. Has 2 rw-but in attic-patient states he doesn't feel comfortable asking a neighbor or anyone to come into his house to get it [facility] rep Clydie Braun checked if 2 rw would qulify for a new one,but they were less than 45yrs old-patient would have to pay for a rw-patient declines to pay for a new rw. PT-no f/u. No further CM needs.                  Action/Plan:d/c home.   Expected Discharge Date:  11/21/17               Expected Discharge Plan:  Home/Self Care  In-House Referral:     Discharge planning Services  CM Consult  Post Acute Care Choice:  (2-rw) Choice offered to:     DME Arranged:    DME Agency:     HH Arranged:    HH Agency:     Status of Service:  Completed, signed off  If discussed at [employer] of Stay Meetings, dates discussed:    Additional Comments:  Lanier Clam, RN 11/22/2017, 10:35 AM

## 2017-11-22 NOTE — Care Management Obs Status (Signed)
MEDICARE OBSERVATION STATUS NOTIFICATION   Patient Details  Name: TAJE EADS MRN: [id number] Date of Birth: 06/30/1932   Medicare Observation Status Notification Given:  Yes    MahabirOlegario Messier, RN 11/22/2017, 11:41 AM

## 2017-11-22 NOTE — Progress Notes (Signed)
Pt to be discharged to home this afternoon. Pt given discharge instructions including Medications and schedules. Pt verbalized understanding of all instructions. Discharged with packet with AVS. VSS No acute changes noted with Pt's assessment at time of discharge.

## 2017-11-22 NOTE — Progress Notes (Signed)
Physical Therapy Treatment Patient Details Name: Daniel [name] MRN: [id number] DOB: [birth year] Today's Date: 11/22/2017    History of Present Illness 84 year old with past medical history relevant for stage III CKD, BPH, chronic atrial fibrillation on warfarin, hyperlipidemia, hypertension, gout who was admitted 11/20/17 \\with  syncopal episode while standing at church and found to be hypothermic. H/P left patella fracture    PT Comments    Pt doing well, would benefit from  Use of RW and HHPT but he is not currently amenable to this; will follow in acute setting  Follow Up Recommendations  Other (comment)(would benefit from HHPT, does not want it)     Equipment Recommendations  None recommended by PT    Recommendations for Other Services       Precautions / Restrictions Precautions Precautions: Fall Restrictions Weight Bearing Restrictions: No    Mobility  Bed Mobility Overal bed mobility: Independent                Transfers Overall transfer level: Needs assistance Equipment used: Straight cane Transfers: Sit to/from Stand Sit to Stand: Supervision         General transfer comment: min/guard for safety upon rising, cues for control of descent  and overall safety  Ambulation/Gait Ambulation/Gait assistance: Min guard;Min assist Gait Distance (Feet): 140 Feet Assistive device: Straight cane Gait Pattern/deviations: Step-through pattern;Wide base of support;Drifts right/left;Staggering right     General Gait Details: pt with continued d/o L knee pain; LOB x 3 while head turning and gesturing  with LUE during conversation, delayed balance reactions (steppage repsonse) but able to recover balance with min/guard; with more fluid gait and even wt shift when cued to focus on gati without talking   Stairs             Wheelchair Mobility    Modified Rankin (Stroke Patients Only)       Balance           Standing balance support: Single extremity  supported;During functional activity Standing balance-Leahy Scale: Fair Standing balance comment: unable to tol challenges             High level balance activites: Backward walking;Direction changes;Turns;Head turns High Level Balance Comments: dealyed steppage response with LOB whild turning head, min/guard to recover            Cognition Arousal/Alertness: Awake/alert Behavior During Therapy: WFL for tasks assessed/performed Overall Cognitive Status: Within Functional Limits for tasks assessed                                 General Comments: pt moves very quickly      Exercises      General Comments        Pertinent Vitals/Pain Pain Assessment: No/denies pain    Home Living                      Prior Function            PT Goals (current goals can now be found in the care plan section) Acute Rehab PT Goals Patient Stated Goal: to go home PT Goal Formulation: With patient Time For Goal Achievement: 11/28/17 Potential to Achieve Goals: Good Progress towards PT goals: Progressing toward goals    Frequency    Min 3X/week      PT Plan Current plan remains appropriate    Co-evaluation  AM-PAC PT "6 Clicks" Daily Activity  Outcome Measure  Difficulty turning over in bed (including adjusting bedclothes, sheets and blankets)?: None Difficulty moving from lying on back to sitting on the side of the bed? : None Difficulty sitting down on and standing up from a chair with arms (e.g., wheelchair, bedside commode, etc,.)?: A Little Help needed moving to and from a bed to chair (including a wheelchair)?: A Little Help needed walking in hospital room?: A Little Help needed climbing 3-5 steps with a railing? : A Little 6 Click Score: 20    End of Session Equipment Utilized During Treatment: Gait belt Activity Tolerance: Patient tolerated treatment well Patient left: in bed;with call bell/phone within reach;with bed  alarm set   PT Visit Diagnosis: Unsteadiness on feet (R26.81);Pain Pain - Right/Left: Left Pain - part of body: Knee     Time: 1133-1200 PT Time Calculation (min) (ACUTE ONLY): 27 min  Charges:  $Gait Training: 23-37 mins                     Drucilla Chalet, [place] Pager: [phone] 11/22/2017   [facility] Acute Rehab Dept [phone]    [facility] 11/22/2017, 12:34 PM

## 2017-11-22 NOTE — Procedures (Signed)
    Consent for Arthrocentesis: Risks and benefits discussed with patient and verbal consent obtained  The L Knee was prepped.    Anesthesia: 2 cc of Lidocaine  Site marked and prepared in sterile fashion. Wheel of lidocaine placed. Lidocaine then introduced into the joint space. Fluid was removed from the joint space.  Samples were not sent to the lab for analysis.   Bloody colored was fluid removed  Amount of fluid removed: 30 cc  Estimated Blood Loss: minimal   Complications:  The patient tolerated the procedure well without complications.

## 2017-11-23 ENCOUNTER — Telehealth: Payer: Self-pay | Admitting: *Deleted

## 2017-11-23 NOTE — Telephone Encounter (Signed)
Left voicemail requesting call back.  

## 2017-11-23 NOTE — Telephone Encounter (Signed)
noted thanks  

## 2017-11-23 NOTE — Telephone Encounter (Signed)
Per telephone call:  Admit date: 11/20/2017 Discharge date: 11/22/2017  Admitted From: Home Disposition: Home  Recommendations for Outpatient Follow-up:  1. Follow up with PCP in 1-2 weeks 2. Please obtain BMP/CBC/INR in one week 3. Please follow up on the following pending results: Echocardiogram, blood culture from 11/20/2017  Home Health: No Equipment/Devices: None  Discharge Condition: Stable CODE STATUS: Full Diet recommendation: Heart Healthy   __________________________________________________________ Transition Care Management Follow-up Telephone Call   Date discharged? 11/22/17   How have you been since you were released from the hospital? "alright"   Do you understand why you were in the hospital? yes   Do you understand the discharge instructions? yes   Where were you discharged to? Home   Items Reviewed:  Medications reviewed: yes  Allergies reviewed: yes  Dietary changes reviewed: yes  Referrals reviewed: yes   Functional Questionnaire:   Activities of Daily Living (ADLs):   He states they are independent in the following: ambulation, bathing and hygiene, feeding, continence, grooming, toileting, dressing and patient states he is doing everything independently but has a cane and walker at home if he needs it.  States they require assistance with the following: none. Patient states that he has a cane and walker at home and was using it but has not had to use it today.   Any transportation issues/concerns?: no   Any patient concerns? Yes. Patient states that he was going to go to the store to get vitamins and protein drinks. He feels that he does not eat healthy or enough and that could be why he ended up in the hospital. I advised him to speak with Dr Durene Cal regarding this during his TCM visit prior to buying any OTC vitamins. I also advised him that I would leave several different flavors of the Ensure Enlive with coupons at the front desk if he  wanted to try different flavors prior to buying any. He agreed to come in and pick them up.    Confirmed importance and date/time of follow-up visits scheduled yes  Provider Appointment booked with Dr Durene Cal 11/29/17 3:45. Patient was not inclined to schedule visit but did agree to do so after further direction. I was going to suggest a possible nutrition visit with Sam but patient may be more inclined after seeing Dr Durene Cal.   Confirmed with patient if condition begins to worsen call PCP or go to the ER.  Patient was given the office number and encouraged to call back with question or concerns.  : yes

## 2017-11-24 NOTE — Telephone Encounter (Signed)
noted 

## 2017-11-25 LAB — CULTURE, BLOOD (ROUTINE X 2)
CULTURE: NO GROWTH
Culture: NO GROWTH
SPECIAL REQUESTS: ADEQUATE
Special Requests: ADEQUATE

## 2017-11-29 ENCOUNTER — Ambulatory Visit (INDEPENDENT_AMBULATORY_CARE_PROVIDER_SITE_OTHER): Payer: PPO | Admitting: Family Medicine

## 2017-11-29 ENCOUNTER — Encounter: Payer: Self-pay | Admitting: Family Medicine

## 2017-11-29 VITALS — BP 130/70 | HR 90 | Temp 97.5°F | Ht 66.0 in | Wt 145.4 lb

## 2017-11-29 DIAGNOSIS — R55 Syncope and collapse: Secondary | ICD-10-CM | POA: Diagnosis not present

## 2017-11-29 DIAGNOSIS — E785 Hyperlipidemia, unspecified: Secondary | ICD-10-CM | POA: Diagnosis not present

## 2017-11-29 DIAGNOSIS — I10 Essential (primary) hypertension: Secondary | ICD-10-CM | POA: Diagnosis not present

## 2017-11-29 DIAGNOSIS — N400 Enlarged prostate without lower urinary tract symptoms: Secondary | ICD-10-CM | POA: Diagnosis not present

## 2017-11-29 DIAGNOSIS — N183 Chronic kidney disease, stage 3 unspecified: Secondary | ICD-10-CM

## 2017-11-29 DIAGNOSIS — M109 Gout, unspecified: Secondary | ICD-10-CM | POA: Diagnosis not present

## 2017-11-29 DIAGNOSIS — I480 Paroxysmal atrial fibrillation: Secondary | ICD-10-CM | POA: Diagnosis not present

## 2017-11-29 NOTE — Patient Instructions (Addendum)
Health Maintenance Due  Topic Date Due  . INFLUENZA VACCINE -please call our office to schedule this for October/November 10/20/2017   I want you to seek care immediately if you have recurrence of passing out. Thrilled you have done well since getting out of hospital  Make sure to remain well hydrated as that could have strongly contributed to your symptoms especially since your aortic valve has some narrowing (not bad, don't need surgery) but important to stay hydrated as a result  No changes today  Please stop by lab before you go

## 2017-11-29 NOTE — Progress Notes (Signed)
Subjective:  Daniel [name] is a 84 y.o. year old very pleasant male patient who presents for transitional care management and hospital follow up for syncope. Patient was hospitalized from 11/20/17 to 11/22/17. A TCM phone call was completed on 11/24/17. Medical complexity moderate   Patient was admitted after having 2 syncopal episodes at church. He admits he did not stay well hydrated that day. He had one while standing up for prolonged period with arms outstretched holding others hands praying. He passed out then went to collect himself in the bathroom after declining ER trip and had another syncopal episodes leaned over on the toilet (possibly stooling). Symptoms were thought most likely vasovagal from prolonged standing. Had telemetry evaluation with no changes. Had MRI brain which showed no acute process but did show old lacunar infarcts (does have a fib history which could have caused but now on anticoagulation).   He was initially hypothermic and received broad spectrum antibiotics with concern for sepsis but pro calcitonin was undetectable and no clear source of bacterial infection found so d/C of antibiotics was made. Blood cultures grew no blood (reviewed today and finalized)  He had an echocardiogram which showed normal EF, grade I diastolic dysfunction and mild aortic stenosis. He was continued on home warfarin and had therapeutic INR  Continued on home Flomax- since symptoms were not related to position change suggesting ortho stasis this was not discontinued   He also had bilateral knee pain left greater than right. Small effusion on left -given Voltaren gel- PT recommended walker. Later had large left knee effusion and arthrocentesis needed on 11/22/17 with removal 330 cc bloody fluid. Left knee pain improved after that.   Hypertension was maintained on home amlodipine. hyperlipidemia was maintained on home atorvastatin.   Patient was continued on allopurinol from home and didn't have gout  flares  MRI brain was reviewed independently without obvious acute infarcts. There are 2 remote lacunar infarcts noted. Some atrophy noted. Some maxillary sinus inflammation  CXR independently reviewed and showe dno acute cardiopulmonary disease, does have some calcification of aorta and is somewhat tortuous   See problem oriented charting as well ROS- he denies further syncope. No chest pain or shortness of breath. Not having dizziness either. No difficulty walking    Past Medical History-  Patient Active Problem List   Diagnosis Date Noted  . Atrial fibrillation (HCC) 11/04/2006    Priority: High  . BPH (benign prostatic hyperplasia) 10/31/2013    Priority: Medium  . Dyslipidemia 08/24/2006    Priority: Medium  . GOUT 08/24/2006    Priority: Medium  . Essential hypertension 08/24/2006    Priority: Medium  . CKD (chronic kidney disease), stage III (HCC) 08/24/2006    Priority: Medium  . Long term (current) use of anticoagulants 12/01/2016    Priority: Low  . Encounter for therapeutic drug monitoring 07/05/2013    Priority: Low  . PULMONARY NODULE, RIGHT UPPER LOBE 11/24/2007    Priority: Low  . Syncope 11/20/2017  . Leukocytosis 08/04/2017  . Caregiver burden 04/06/2017  . S/P knee surgery 07/16/2014    Medications- reviewed and updated  A medical reconciliation was performed comparing current medicines to hospital discharge medications. Current Outpatient Medications  Medication Sig Dispense Refill  . allopurinol (ZYLOPRIM) 300 MG tablet Take 1 tablet (300 mg total) by mouth daily. (Patient taking differently: Take 300 mg by mouth every evening. ) 90 tablet 1  . amLODipine (NORVASC) 5 MG tablet TAKE 1 TABLET (5 MG TOTAL) BY  MOUTH DAILY. (Patient taking differently: Take 5 mg by mouth every evening. ) 90 tablet 0  . atorvastatin (LIPITOR) 20 MG tablet TAKE ONE TABLET BY MOUTH DAILY AT 6 PM (REPLACES SIMVASTATIN) (Patient taking differently: Take 20 mg by mouth daily at 6  PM. ) 90 tablet 3  . brimonidine (ALPHAGAN) 0.2 % ophthalmic solution Place 1 drop into both eyes 2 (two) times daily.     . folic acid (FOLVITE) 1 MG tablet Take 1 tablet (1 mg total) by mouth daily. (Patient taking differently: Take 1 mg by mouth every evening. ) 30 tablet 2  . tamsulosin (FLOMAX) 0.4 MG CAPS capsule TAKE 1 CAPSULE BY MOUTH DAILY AFTER BREAKFAST. (Patient taking differently: Take 0.4 mg by mouth daily after breakfast. ) 90 capsule 0  . VOLTAREN 1 % GEL Apply 1 application topically daily as needed (for knee pain).     Marland Kitchen warfarin (COUMADIN) 2.5 MG tablet Take 1 tablet daily or TAKE AS DIRECTED BY ANTICOAGULATION CLINIC (Patient taking differently: Take 1.25-2.5 mg by mouth See admin instructions. Take 1.25 mg by mouth daily on Monday, Wednesday and Friday in the evening. Take 2.5 mg by mouth daily in the evening on all other days.) 90 tablet 1   Objective: BP 130/70 (BP Location: Left Arm, Patient Position: Sitting, Cuff Size: Normal)   Pulse 90   Temp (!) 97.5 F (36.4 C) (Oral)   Ht 5\' 6"  (1.676 m)   Wt 145 lb 6.4 oz (66 kg)   SpO2 97%   BMI 23.47 kg/m  Gen: NAD, resting comfortably CV: RRR no murmurs rubs or gallops Lungs: CTAB no crackles, wheeze, rhonchi Abdomen: soft/nontender/nondistended/normal bowel sounds.  Ext: no edema Skin: warm, dry Neuro: grossly normal, moves all extremities Not lightheaded with standing today, no assistive device used  Assessment/Plan:  Syncope Syncope likely vasovagal with prolonged standing and then 2nd episode with micturition or defecation. Echo was reassuring other than mild aortic stenosis- we discussed preload dependence of this and importance of staying well hydrated (which he was not at time of episode). Could consider carotid duplex if recurrence. No seizure like activity to suggest need for EEG and MRI reassuring other than remote strokes (could be related to known a fib)   Atrial fibrillation (HCC) Rate controlled without  rx. INR at goal today on coumadin between 1.5-2.5 due to history epistaxis  Dyslipidemia- continue atorvastatin  Gout, unspecified cause, unspecified chronicity, unspecified site- continue allopurinol  Essential hypertension- continue amlodipine 5mg    CKD (chronic kidney disease), stage III (HCC)- GFR around mid 30s on exam today and with gfr stable around 2 which appears to be baseline since 2017. If worsens refer to renal  Benign prostatic hyperplasia - continue flomax  Return precautions advised.  Tana Conch, MD

## 2017-11-30 LAB — COMPREHENSIVE METABOLIC PANEL
ALBUMIN: 3.6 g/dL (ref 3.5–5.2)
ALK PHOS: 159 U/L — AB (ref 39–117)
ALT: 21 U/L (ref 0–53)
AST: 30 U/L (ref 0–37)
BUN: 32 mg/dL — AB (ref 6–23)
CO2: 25 mEq/L (ref 19–32)
CREATININE: 1.92 mg/dL — AB (ref 0.40–1.50)
Calcium: 8.8 mg/dL (ref 8.4–10.5)
Chloride: 102 mEq/L (ref 96–112)
GFR: 35.72 mL/min — ABNORMAL LOW (ref >60.00)
Glucose, Bld: 89 mg/dL (ref 70–99)
Potassium: 4.1 mEq/L (ref 3.5–5.1)
SODIUM: 137 meq/L (ref 135–145)
TOTAL PROTEIN: 8 g/dL (ref 6.0–8.3)
Total Bilirubin: 0.3 mg/dL (ref 0.2–1.2)

## 2017-11-30 LAB — CBC
HCT: 36.8 % — ABNORMAL LOW (ref 39.0–52.0)
Hemoglobin: 12 g/dL — ABNORMAL LOW (ref 13.0–17.0)
MCHC: 32.5 g/dL (ref 30.0–36.0)
MCV: 96.3 fl (ref 78.0–100.0)
Platelets: 324 10*3/uL (ref 150.0–400.0)
RBC: 3.82 Mil/uL — AB (ref 4.22–5.81)
RDW: 15.6 % — ABNORMAL HIGH (ref 11.5–15.5)
WBC: 13.2 10*3/uL — AB (ref 4.0–10.5)

## 2017-11-30 LAB — PROTIME-INR
INR: 1.7 — AB
Prothrombin Time: 17.6 s — ABNORMAL HIGH (ref 9.0–11.5)

## 2017-11-30 NOTE — Assessment & Plan Note (Signed)
Rate controlled without rx. INR at goal today on coumadin between 1.5-2.5 due to history epistaxis

## 2017-11-30 NOTE — Assessment & Plan Note (Signed)
Syncope likely vasovagal with prolonged standing and then 2nd episode with micturition or defecation. Echo was reassuring other than mild aortic stenosis- we discussed preload dependence of this and importance of staying well hydrated (which he was not at time of episode). Could consider carotid duplex if recurrence. No seizure like activity to suggest need for EEG and MRI reassuring other than remote strokes (could be related to known a fib)

## 2017-12-13 ENCOUNTER — Encounter: Payer: Self-pay | Admitting: Podiatry

## 2017-12-13 ENCOUNTER — Ambulatory Visit: Payer: PPO | Admitting: Podiatry

## 2017-12-13 DIAGNOSIS — B351 Tinea unguium: Secondary | ICD-10-CM | POA: Diagnosis not present

## 2017-12-13 DIAGNOSIS — D689 Coagulation defect, unspecified: Secondary | ICD-10-CM | POA: Diagnosis not present

## 2017-12-13 DIAGNOSIS — M79676 Pain in unspecified toe(s): Secondary | ICD-10-CM

## 2017-12-14 NOTE — Progress Notes (Signed)
Subjective: 85 y.o. returns the office today for painful, elongated, thickened toenails which he cannot trim himself. Denies any redness or drainage around the nails. Denies any acute changes since last appointment and no new complaints today. Denies any systemic complaints such as fevers, chills, nausea, vomiting.   He is on coumadin.   Objective: AAO 3, NAD DP/PT pulses palpable, CRT less than 3 seconds Nails hypertrophic, dystrophic, elongated, brittle, discolored 10. All of the nails appear to be discolored and thickened. No open lesions or pre-ulcerative lesions are identified. No pain with calf compression, swelling, warmth, erythema.  Assessment: Patient presents with symptomatic onychomycosis  Plan: -Treatment options including alternatives, risks, complications were discussed -Nails sharply debrided 10 without complication/bleeding. -Discussed daily foot inspection. If there are any changes, to call the office immediately.  -Follow-up in 3 months or sooner if any problems are to arise. In the meantime, encouraged to call the office with any questions, concerns, changes symptoms.  Matthew Wagoner, DPM  

## 2017-12-21 ENCOUNTER — Ambulatory Visit (INDEPENDENT_AMBULATORY_CARE_PROVIDER_SITE_OTHER): Payer: PPO | Admitting: General Practice

## 2017-12-21 DIAGNOSIS — Z7901 Long term (current) use of anticoagulants: Secondary | ICD-10-CM

## 2017-12-21 DIAGNOSIS — I4891 Unspecified atrial fibrillation: Secondary | ICD-10-CM | POA: Diagnosis not present

## 2017-12-21 DIAGNOSIS — H401131 Primary open-angle glaucoma, bilateral, mild stage: Secondary | ICD-10-CM | POA: Diagnosis not present

## 2017-12-21 LAB — POCT INR: INR: 2.5 (ref 2.0–3.0)

## 2017-12-21 NOTE — Patient Instructions (Addendum)
Pre visit review using our clinic review tool, if applicable. No additional management support is needed unless otherwise documented below in the visit note.  Continue to take 1 tablet all days except a half tablet on Monday/Wednesday/Friday.  Re-check in 6 weeks at patient request.  

## 2017-12-21 NOTE — Progress Notes (Signed)
I have reviewed and agree with note, evaluation, plan.   Stephen Hunter, MD  

## 2018-01-10 ENCOUNTER — Other Ambulatory Visit: Payer: Self-pay | Admitting: Family Medicine

## 2018-02-01 ENCOUNTER — Ambulatory Visit: Payer: PPO

## 2018-02-01 DIAGNOSIS — Z85828 Personal history of other malignant neoplasm of skin: Secondary | ICD-10-CM | POA: Diagnosis not present

## 2018-02-01 DIAGNOSIS — X32XXXD Exposure to sunlight, subsequent encounter: Secondary | ICD-10-CM | POA: Diagnosis not present

## 2018-02-01 DIAGNOSIS — Z08 Encounter for follow-up examination after completed treatment for malignant neoplasm: Secondary | ICD-10-CM | POA: Diagnosis not present

## 2018-02-01 DIAGNOSIS — L570 Actinic keratosis: Secondary | ICD-10-CM | POA: Diagnosis not present

## 2018-02-04 ENCOUNTER — Other Ambulatory Visit: Payer: Self-pay | Admitting: Family Medicine

## 2018-02-08 ENCOUNTER — Ambulatory Visit (INDEPENDENT_AMBULATORY_CARE_PROVIDER_SITE_OTHER): Payer: PPO | Admitting: General Practice

## 2018-02-08 DIAGNOSIS — I4891 Unspecified atrial fibrillation: Secondary | ICD-10-CM

## 2018-02-08 DIAGNOSIS — Z7901 Long term (current) use of anticoagulants: Secondary | ICD-10-CM

## 2018-02-08 LAB — POCT INR: INR: 1.5 — AB (ref 2.0–3.0)

## 2018-02-08 NOTE — Progress Notes (Signed)
I have reviewed and agree with note, evaluation, plan.   Stephen Hunter, MD  

## 2018-02-08 NOTE — Patient Instructions (Addendum)
Pre visit review using our clinic review tool, if applicable. No additional management support is needed unless otherwise documented below in the visit note.  Continue to take 1 tablet all days except a half tablet on Monday/Wednesday/Friday.  Re-check in 6 weeks at patient request.  

## 2018-02-13 ENCOUNTER — Encounter: Payer: Self-pay | Admitting: Podiatry

## 2018-02-13 ENCOUNTER — Ambulatory Visit: Payer: PPO | Admitting: Podiatry

## 2018-02-13 DIAGNOSIS — M79676 Pain in unspecified toe(s): Secondary | ICD-10-CM | POA: Diagnosis not present

## 2018-02-13 DIAGNOSIS — B351 Tinea unguium: Secondary | ICD-10-CM | POA: Diagnosis not present

## 2018-02-13 DIAGNOSIS — D689 Coagulation defect, unspecified: Secondary | ICD-10-CM | POA: Diagnosis not present

## 2018-02-13 NOTE — Progress Notes (Signed)
Subjective: 84 y.o. returns the office today for painful, elongated, thickened toenails which he cannot trim himself. Denies any redness or drainage around the nails. Denies any acute changes since last appointment and no new complaints today. Denies any systemic complaints such as fevers, chills, nausea, vomiting.   He is on coumadin.   Objective: AAO 3, NAD DP/PT pulses palpable, CRT less than 3 seconds Nails hypertrophic, dystrophic, elongated, brittle, discolored 10. All of the nails appear to be discolored and thickened. No open lesions or pre-ulcerative lesions are identified. No pain with calf compression, swelling, warmth, erythema. Overall, doing well  Assessment: Patient presents with symptomatic onychomycosis  Plan: -Treatment options including alternatives, risks, complications were discussed -Nails sharply debrided 10 without complication/bleeding. -Discussed daily foot inspection. If there are any changes, to call the office immediately.  -Follow-up in 3 months or sooner if any problems are to arise. In the meantime, encouraged to call the office with any questions, concerns, changes symptoms.  Ovid CurdMatthew Wagoner, DPM

## 2018-03-02 ENCOUNTER — Emergency Department (HOSPITAL_COMMUNITY)
Admission: EM | Admit: 2018-03-02 | Discharge: 2018-03-02 | Disposition: A | Discharge status: Home / Self Care | Payer: PPO | Attending: Emergency Medicine | Admitting: Emergency Medicine

## 2018-03-02 ENCOUNTER — Encounter (HOSPITAL_COMMUNITY): Payer: Self-pay | Admitting: Emergency Medicine

## 2018-03-02 DIAGNOSIS — Z87891 Personal history of nicotine dependence: Secondary | ICD-10-CM | POA: Diagnosis not present

## 2018-03-02 DIAGNOSIS — Z79899 Other long term (current) drug therapy: Secondary | ICD-10-CM | POA: Diagnosis not present

## 2018-03-02 DIAGNOSIS — I129 Hypertensive chronic kidney disease with stage 1 through stage 4 chronic kidney disease, or unspecified chronic kidney disease: Secondary | ICD-10-CM | POA: Insufficient documentation

## 2018-03-02 DIAGNOSIS — Z8673 Personal history of transient ischemic attack (TIA), and cerebral infarction without residual deficits: Secondary | ICD-10-CM | POA: Insufficient documentation

## 2018-03-02 DIAGNOSIS — Z7901 Long term (current) use of anticoagulants: Secondary | ICD-10-CM | POA: Insufficient documentation

## 2018-03-02 DIAGNOSIS — N183 Chronic kidney disease, stage 3 (moderate): Secondary | ICD-10-CM | POA: Diagnosis not present

## 2018-03-02 DIAGNOSIS — M26621 Arthralgia of right temporomandibular joint: Secondary | ICD-10-CM | POA: Diagnosis not present

## 2018-03-02 DIAGNOSIS — K0889 Other specified disorders of teeth and supporting structures: Secondary | ICD-10-CM | POA: Diagnosis present

## 2018-03-02 MED ORDER — METHOCARBAMOL 500 MG PO TABS: 500.0000 mg | ORAL_TABLET | Freq: Two times a day (BID) | ORAL | 0 refills | Status: DC
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
## 2018-03-02 NOTE — ED Notes (Signed)
Discharged pt for Arlyce DiceM. Evans RN

## 2018-03-02 NOTE — ED Triage Notes (Signed)
Pt c/o right side dental pain possibly swelling.

## 2018-03-04 ENCOUNTER — Emergency Department (HOSPITAL_COMMUNITY)
Admission: EM | Admit: 2018-03-04 | Discharge: 2018-03-04 | Disposition: A | Discharge status: Home / Self Care | Payer: PPO | Attending: Emergency Medicine | Admitting: Emergency Medicine

## 2018-03-04 ENCOUNTER — Other Ambulatory Visit: Payer: Self-pay

## 2018-03-04 ENCOUNTER — Encounter (HOSPITAL_COMMUNITY): Payer: Self-pay

## 2018-03-04 DIAGNOSIS — Z79899 Other long term (current) drug therapy: Secondary | ICD-10-CM | POA: Insufficient documentation

## 2018-03-04 DIAGNOSIS — Z87891 Personal history of nicotine dependence: Secondary | ICD-10-CM | POA: Diagnosis not present

## 2018-03-04 DIAGNOSIS — M109 Gout, unspecified: Secondary | ICD-10-CM | POA: Diagnosis not present

## 2018-03-04 DIAGNOSIS — M25561 Pain in right knee: Secondary | ICD-10-CM | POA: Diagnosis present

## 2018-03-04 DIAGNOSIS — N183 Chronic kidney disease, stage 3 (moderate): Secondary | ICD-10-CM | POA: Insufficient documentation

## 2018-03-04 DIAGNOSIS — Z7901 Long term (current) use of anticoagulants: Secondary | ICD-10-CM | POA: Diagnosis not present

## 2018-03-04 DIAGNOSIS — I129 Hypertensive chronic kidney disease with stage 1 through stage 4 chronic kidney disease, or unspecified chronic kidney disease: Secondary | ICD-10-CM | POA: Insufficient documentation

## 2018-03-04 DIAGNOSIS — M10061 Idiopathic gout, right knee: Secondary | ICD-10-CM | POA: Diagnosis not present

## 2018-03-04 LAB — CBC
HCT: 39.2 % (ref 39.0–52.0)
Hemoglobin: 12.8 g/dL — ABNORMAL LOW (ref 13.0–17.0)
MCH: 31.7 pg (ref 26.0–34.0)
MCHC: 32.7 g/dL (ref 30.0–36.0)
MCV: 97 fL (ref 80.0–100.0)
Platelets: 227 10*3/uL (ref 150–400)
RBC: 4.04 MIL/uL — ABNORMAL LOW (ref 4.22–5.81)
RDW: 14.8 % (ref 11.5–15.5)
WBC: 18.4 10*3/uL — ABNORMAL HIGH (ref 4.0–10.5)
nRBC: 0 % (ref 0.0–0.2)

## 2018-03-04 LAB — BASIC METABOLIC PANEL
Anion gap: 13 (ref 5–15)
BUN: 32 mg/dL — ABNORMAL HIGH (ref 8–23)
CHLORIDE: 99 mmol/L (ref 98–111)
CO2: 22 mmol/L (ref 22–32)
Calcium: 8.8 mg/dL — ABNORMAL LOW (ref 8.9–10.3)
Creatinine, Ser: 2.46 mg/dL — ABNORMAL HIGH (ref 0.61–1.24)
GFR calc Af Amer: 27 mL/min — ABNORMAL LOW (ref >60)
GFR calc non Af Amer: 23 mL/min — ABNORMAL LOW (ref >60)
GLUCOSE: 124 mg/dL — AB (ref 70–99)
Potassium: 3.5 mmol/L (ref 3.5–5.1)
Sodium: 134 mmol/L — ABNORMAL LOW (ref 135–145)

## 2018-03-04 LAB — SYNOVIAL CELL COUNT + DIFF, W/ CRYSTALS
Eosinophils-Synovial: 0 % (ref 0–1)
Lymphocytes-Synovial Fld: 0 % (ref 0–20)
Monocyte-Macrophage-Synovial Fluid: 15 % — ABNORMAL LOW (ref 50–90)
NEUTROPHIL, SYNOVIAL: 85 % — AB (ref 0–25)
WBC, Synovial: 28500 /mm3 — ABNORMAL HIGH (ref 0–200)

## 2018-03-04 LAB — PROTIME-INR
INR: 2.12
PROTHROMBIN TIME: 23.4 s — AB (ref 11.4–15.2)

## 2018-03-04 MED ORDER — HYDROCODONE-ACETAMINOPHEN 5-325 MG PO TABS: 1.0000 | ORAL_TABLET | ORAL | 0 refills | Status: DC | PRN

## 2018-03-04 MED ORDER — LIDOCAINE-EPINEPHRINE (PF) 2 %-1:200000 IJ SOLN
20.0000 mL | Freq: Once | INTRAMUSCULAR | Status: AC
  Administered 2018-03-04: 20 mL
  Filled 2018-03-04: qty 20
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
## 2018-03-04 NOTE — ED Notes (Signed)
Patient advised to get undressed, patient arguing, patient not wanting to get undressed.

## 2018-03-04 NOTE — ED Triage Notes (Addendum)
Patient reports that he was seen recently for mouth pain and was given Methocarbamol and states since taking this he began having right knee pain and swelling. Patient denies any injury.  Writer was going to order right knee x-ray per protocol,but patient refused. Patient stted, "I just want the fluid removed."

## 2018-03-05 LAB — GLUCOSE, BODY FLUID OTHER: Glucose, Body Fluid Other: 41 mg/dL

## 2018-03-05 LAB — PROTEIN, BODY FLUID (OTHER): Total Protein, Body Fluid Other: 4 g/dL

## 2018-03-06 ENCOUNTER — Telehealth: Payer: Self-pay | Admitting: Family Medicine

## 2018-03-06 NOTE — Telephone Encounter (Signed)
Copied from CRM [id number]. Topic: Appointment Scheduling - Scheduling Inquiry for Clinic >> Mar 06, 2018 10:05 AM Jay SchlichterWeikart, Melissa J wrote: Reason for CRM: pt would like to be seen as soon as possible for ed follow up with Dr Durene CalHunter.  Call dropped with pt, and no answer at office.  Please call [phone] >> Mar 06, 2018 11:01 AM Pauline AusEvans, Kara B wrote: See if patient can be worked in, ask [employer]Jamie

## 2018-03-06 NOTE — Telephone Encounter (Signed)
See note

## 2018-03-06 NOTE — Telephone Encounter (Signed)
I called the patient after speaking to Dr. Durene CalHunter who advised me to tell the patient to go to the ED immediately. Patient stated that "he felt better, his chest just hurts." I advised again that "Dr. Durene CalHunter is not able to have him make an appointment as he needs to go to the ED to get possible scans that Dr. Durene CalHunter will need." Patient stated he would call his brother in law to see about taking him. I suggested "he call 911 for transportation if needed but to go today as this could be serious." Patient refused to tell me if he would or would not go. I advised I would update Dr. Erasmo LeventhalHunter's team.

## 2018-03-06 NOTE — Telephone Encounter (Signed)
Pt calling back to check status on hearing if he can get an appt. He also mentions that he fell last night and hit his head and "gashed" his head. He states it bled a lot. Pt also states when breathing it hurts in his chest today. I offered to send pt to [facility]. Pt refused. Please advise.

## 2018-03-07 ENCOUNTER — Telehealth: Payer: Self-pay

## 2018-03-07 LAB — BODY FLUID CULTURE
Culture: NO GROWTH
SPECIAL REQUESTS: NORMAL

## 2018-03-07 NOTE — Telephone Encounter (Signed)
See note. I advised Dr. Durene CalHunter and Victorino DikeJennifer to assist.

## 2018-03-07 NOTE — Telephone Encounter (Signed)
[facility]t tomorrow morning 12/18 @ 7:45 am.    Pt was originally advised today

## 2018-03-07 NOTE — Telephone Encounter (Signed)
[facility] been scheduled for appt tomorrow 12/18 @ 7:45 am.  Originally had called and spoken with Lorelee MarketWendy Box (daughter in law) and advised per Dr. Durene CalHunter that family should contact 911 for pt to be transported to ED immediately due to slurred speech thought to be a result of fall from 12/15.  Pt sustained head injury and is also on Coumadin.  Family had taken pt to ED last night 12/16 per daughter in law, Toniann FailWendy there was a 6 hr wait.  Pt refused to wait and they went home.  Peer daughter in law, they had given him Oxycodone last night for pain that he was c/o in his ribs that he sustained from his fall.  Per daughter in law, pt did not have slurred speech.  Dr. Durene CalHunter strongly suggesting that pt be evaluated for stroke.

## 2018-03-07 NOTE — Telephone Encounter (Signed)
See note

## 2018-03-07 NOTE — Telephone Encounter (Signed)
See other note from today-patient refuses to go to the emergency room for second day in a row-this time for newly reported slurred speech- Toniann FailWendy reports today that there is no slurred speech so it is not 100% clear where this concern came from.  He needs to be evaluated and he did agree to come in tomorrow morning for evaluation thankfully-still strongly suspect may need neuro imaging but thankful family members state no slurred speech in follow-up phone calls.  Once again our recommended disposition was the emergency room and he declined

## 2018-03-07 NOTE — Telephone Encounter (Signed)
Patients sister in law Toniann FailWendy Box stating patient is having slurred speech and not able to contact patient at the moment. Sister is requesting to speak to practice admin upon clinical advise.

## 2018-03-08 ENCOUNTER — Ambulatory Visit (INDEPENDENT_AMBULATORY_CARE_PROVIDER_SITE_OTHER): Payer: PPO

## 2018-03-08 ENCOUNTER — Ambulatory Visit (INDEPENDENT_AMBULATORY_CARE_PROVIDER_SITE_OTHER): Payer: PPO | Admitting: Family Medicine

## 2018-03-08 ENCOUNTER — Encounter: Payer: Self-pay | Admitting: Family Medicine

## 2018-03-08 ENCOUNTER — Ambulatory Visit (HOSPITAL_COMMUNITY)
Admission: RE | Admit: 2018-03-08 | Discharge: 2018-03-08 | Disposition: A | Discharge status: Home / Self Care | Payer: PPO | Source: Ambulatory Visit | Attending: Family Medicine | Admitting: Family Medicine

## 2018-03-08 VITALS — BP 114/68 | HR 85 | Temp 97.6°F | Ht 66.0 in | Wt 142.6 lb

## 2018-03-08 DIAGNOSIS — R51 Headache: Secondary | ICD-10-CM | POA: Diagnosis not present

## 2018-03-08 DIAGNOSIS — R0781 Pleurodynia: Secondary | ICD-10-CM | POA: Diagnosis not present

## 2018-03-08 DIAGNOSIS — S0990XA Unspecified injury of head, initial encounter: Secondary | ICD-10-CM

## 2018-03-08 DIAGNOSIS — S0101XA Laceration without foreign body of scalp, initial encounter: Secondary | ICD-10-CM | POA: Insufficient documentation

## 2018-03-08 DIAGNOSIS — W19XXXA Unspecified fall, initial encounter: Secondary | ICD-10-CM | POA: Diagnosis not present

## 2018-03-08 DIAGNOSIS — Z23 Encounter for immunization: Secondary | ICD-10-CM | POA: Diagnosis not present

## 2018-03-08 DIAGNOSIS — S299XXA Unspecified injury of thorax, initial encounter: Secondary | ICD-10-CM | POA: Diagnosis not present

## 2018-03-08 LAB — COMPREHENSIVE METABOLIC PANEL
ALT: 48 U/L (ref 0–53)
AST: 60 U/L — ABNORMAL HIGH (ref 0–37)
Albumin: 3.3 g/dL — ABNORMAL LOW (ref 3.5–5.2)
Alkaline Phosphatase: 190 U/L — ABNORMAL HIGH (ref 39–117)
BUN: 38 mg/dL — AB (ref 6–23)
CO2: 25 mEq/L (ref 19–32)
CREATININE: 2.08 mg/dL — AB (ref 0.40–1.50)
Calcium: 8.6 mg/dL (ref 8.4–10.5)
Chloride: 100 mEq/L (ref 96–112)
GFR: 32.54 mL/min — ABNORMAL LOW (ref >60.00)
Glucose, Bld: 123 mg/dL — ABNORMAL HIGH (ref 70–99)
Potassium: 3.5 mEq/L (ref 3.5–5.1)
SODIUM: 137 meq/L (ref 135–145)
TOTAL PROTEIN: 7.3 g/dL (ref 6.0–8.3)
Total Bilirubin: 0.5 mg/dL (ref 0.2–1.2)

## 2018-03-08 LAB — CBC
HCT: 36.1 % — ABNORMAL LOW (ref 39.0–52.0)
Hemoglobin: 11.9 g/dL — ABNORMAL LOW (ref 13.0–17.0)
MCHC: 33 g/dL (ref 30.0–36.0)
MCV: 95.7 fl (ref 78.0–100.0)
Platelets: 297 10*3/uL (ref 150.0–400.0)
RBC: 3.77 Mil/uL — ABNORMAL LOW (ref 4.22–5.81)
RDW: 15.2 % (ref 11.5–15.5)
WBC: 12.7 10*3/uL — ABNORMAL HIGH (ref 4.0–10.5)

## 2018-03-08 LAB — PROTIME-INR
INR: 2.2 ratio — ABNORMAL HIGH (ref 0.8–1.0)
Prothrombin Time: 25.5 s — ABNORMAL HIGH (ref 9.6–13.1)

## 2018-03-08 NOTE — Patient Instructions (Addendum)
Last tetanus shot over 5 years ago- team please update Td today  Please stop by lab and x-ray before you go   Do not leave until we get you set up for your CT scan-I am hoping to get this done today.  You can sit in the lobby to wait on your appointment after your labs and x-ray  If you have new or worsening symptoms please seek care in the emergency room.

## 2018-03-08 NOTE — Addendum Note (Signed)
Addended by: Heide SparkSOUTHERN HIZER, Rushi Chasen M on: 03/08/2018 09:03 AM   Modules accepted: Orders

## 2018-03-08 NOTE — Telephone Encounter (Signed)
Pt seen in office today at 7:45 AM.

## 2018-03-08 NOTE — Progress Notes (Addendum)
Subjective:  Daniel [name] is a 84 y.o. year old very pleasant male patient who presents for/with See problem oriented charting ROS- No facial or extremity weakness. No slurred words or trouble swallowing. no blurry vision or double vision. No paresthesias. No confusion or word finding difficulties.    Past Medical History-  Patient Active Problem List   Diagnosis Date Noted  . Atrial fibrillation (HCC) 11/04/2006    Priority: High  . BPH (benign prostatic hyperplasia) 10/31/2013    Priority: Medium  . Dyslipidemia 08/24/2006    Priority: Medium  . GOUT 08/24/2006    Priority: Medium  . Essential hypertension 08/24/2006    Priority: Medium  . CKD (chronic kidney disease), stage III (HCC) 08/24/2006    Priority: Medium  . Long term (current) use of anticoagulants 12/01/2016    Priority: Low  . Encounter for therapeutic drug monitoring 07/05/2013    Priority: Low  . PULMONARY NODULE, RIGHT UPPER LOBE 11/24/2007    Priority: Low  . Syncope 11/20/2017  . Leukocytosis 08/04/2017  . Caregiver burden 04/06/2017  . S/P knee surgery 07/16/2014    Medications- reviewed and updated Current Outpatient Medications  Medication Sig Dispense Refill  . allopurinol (ZYLOPRIM) 300 MG tablet Take 1 tablet (300 mg total) by mouth daily. (Patient taking differently: Take 300 mg by mouth every evening. ) 90 tablet 1  . amLODipine (NORVASC) 5 MG tablet TAKE ONE TABLET BY MOUTH DAILY (Patient taking differently: Take 5 mg by mouth daily. ) 90 tablet 2  . atorvastatin (LIPITOR) 20 MG tablet TAKE ONE TABLET BY MOUTH DAILY AT 6 PM (REPLACES SIMVASTATIN) (Patient taking differently: Take 20 mg by mouth daily at 6 PM. ) 90 tablet 3  . brimonidine (ALPHAGAN) 0.2 % ophthalmic solution Place 1 drop into both eyes 2 (two) times daily.     . folic acid (FOLVITE) 1 MG tablet Take 1 tablet (1 mg total) by mouth daily. (Patient taking differently: Take 1 mg by mouth every evening. ) 30 tablet 2  .  HYDROcodone-acetaminophen (NORCO/VICODIN) 5-325 MG tablet Take 1 tablet by mouth every 4 (four) hours as needed for moderate pain. 15 tablet 0  . methocarbamol (ROBAXIN) 500 MG tablet Take 1 tablet (500 mg total) by mouth 2 (two) times daily. 20 tablet 0  . tamsulosin (FLOMAX) 0.4 MG CAPS capsule TAKE ONE CAPSULE BY MOUTH DAILY AFTER BREAKFAST (Patient taking differently: Take 0.4 mg by mouth daily. ) 90 capsule 1  . VOLTAREN 1 % GEL Apply 1 application topically daily as needed (for knee pain).     Marland Kitchen. warfarin (COUMADIN) 2.5 MG tablet Take 1 tablet daily or TAKE AS DIRECTED BY ANTICOAGULATION CLINIC (Patient taking differently: Take 1.25-2.5 mg by mouth See admin instructions. Take 1.25 mg by mouth daily on Monday, Wednesday and Friday in the evening. Take 2.5 mg by mouth daily in the evening on Tuesday, Thursday, Saturday and Sunday) 90 tablet 1   No current facility-administered medications for this visit.     Objective: BP 114/68 (BP Location: Left Arm, Patient Position: Sitting, Cuff Size: Normal)   Pulse 85   Temp 97.6 F (36.4 C) (Oral)   Ht 5\' 6"  (1.676 m)   Wt 142 lb 9.6 oz (64.7 kg)   SpO2 97%   BMI 23.02 kg/m  Gen: NAD, resting comfortably Mucous membranes are dry CV: RRR no murmurs rubs or gallops Patient very tender in one spot over left chest Lungs: CTAB no crackles, wheeze, rhonchi Abdomen: soft/nontender/nondistended Skin: warm,  dry, scabbed over laceration on central frontal scalp Neuro: CN II-XII intact, sensation and reflexes normal throughout, 5/5 muscle strength in bilateral upper and lower extremities. Normal finger to nose. Normal rapid alternating movements. No pronator drift. Normal romberg. Normal gait.  Slight change in his baseline speech-potentially related to dry mouth- speech does improve after he drinks some water  Assessment/Plan:   Fall, initial encounter - Plan: Protime-INR, CT Head Wo Contrast  Laceration of scalp, initial encounter - Plan:  Protime-INR, CT Head Wo Contrast  Rib pain on left side - Plan: CBC, Comprehensive metabolic panel, DG Ribs Unilateral W/Chest Left  Injury of head, initial encounter - Plan: CT Head Wo Contrast  S: Patient fell and hit his head on 03/05/2018- he states he "gashed" his head and bled a lot initially.  He states was getting out of bed and didn't have walker or cane- he states has needed these since arthrocentesis on 03/04/18 in ED. He states reached for cane but started to go sideways- took several steps over and then fell into doorframe- hit his head on doorframe. Had some mild pain in his left wrist that is almost resolved. Also has some pain in left side of ribs when he fell all the way to the ground.   He called in on 03/06/2018 and due to him being on Coumadin was recommended he go to the emergency room.  He also complained of it hurting his chest when breathing per phone note.  Patient went to the emergency room but there was a 6-hour wait and he decided not to stay and went home.  The next day we got a call suggesting possible slurred speech but later family to denied this.  When we initially heard this we recommended emergency room again to rule out stroke and subdural hematoma but patient firmly declined this again.  Today, he states pain is about 3/10 in ribs. Not taking anything for pain over left ribs. He denies headache. He has not noted any slurred speech- daughter reportedly noted this but sister in law did not note any slurred speech. He states mouth has been dry and wonders if that was interpreted as slurred speech - better right after drinking water. He states had this dry mouth before the fall but has been worse since then.  A/P: 84 year old male with fall 2 days ago and head laceration and left-sided rib pain -Patient states he has a dry mouth and thus speech slightly altered - Need CT head to rule out subdural hematoma/bleed -We will get rib films to evaluate left rib  pain -Update labs particularly including INR in case anticoagulation needed to be reversed in case of subdural hematoma/bleeding - Encouraged patient to seek care in emergency room if he has new or worsening symptoms  Future Appointments  Date Time Provider Department Center  03/29/2018 11:00 AM [facility]  04/17/2018  1:15 PM Vivi Barrack, DPM [facility]   Lab/Order associations: Fall, initial encounter - Plan: Protime-INR, CT Head Wo Contrast  Laceration of scalp, initial encounter - Plan: Protime-INR, CT Head Wo Contrast  Rib pain on left side - Plan: CBC, Comprehensive metabolic panel, DG Ribs Unilateral W/Chest Left  Injury of head, initial encounter - Plan: CT Head Wo Contrast  Return precautions advised.  Tana Conch, MD

## 2018-03-14 ENCOUNTER — Other Ambulatory Visit: Payer: Self-pay

## 2018-03-14 DIAGNOSIS — M25461 Effusion, right knee: Secondary | ICD-10-CM

## 2018-03-29 ENCOUNTER — Ambulatory Visit (INDEPENDENT_AMBULATORY_CARE_PROVIDER_SITE_OTHER): Payer: PPO | Admitting: General Practice

## 2018-03-29 DIAGNOSIS — I4891 Unspecified atrial fibrillation: Secondary | ICD-10-CM

## 2018-03-29 DIAGNOSIS — Z7901 Long term (current) use of anticoagulants: Secondary | ICD-10-CM | POA: Diagnosis not present

## 2018-03-29 LAB — POCT INR: INR: 1.9 — AB (ref 2.0–3.0)

## 2018-03-29 NOTE — Progress Notes (Signed)
I have reviewed and agree with note, evaluation, plan.   Samar Dass, MD  

## 2018-03-29 NOTE — Patient Instructions (Addendum)
Pre visit review using our clinic review tool, if applicable. No additional management support is needed unless otherwise documented below in the visit note.  Continue to take 1 tablet all days except a half tablet on Monday/Wednesday/Friday.  Re-check in 6 weeks at patient request.  

## 2018-04-02 ENCOUNTER — Other Ambulatory Visit: Payer: Self-pay | Admitting: Family Medicine

## 2018-04-12 ENCOUNTER — Ambulatory Visit (INDEPENDENT_AMBULATORY_CARE_PROVIDER_SITE_OTHER): Payer: PPO | Admitting: Family Medicine

## 2018-04-12 ENCOUNTER — Encounter: Payer: Self-pay | Admitting: Family Medicine

## 2018-04-12 VITALS — BP 120/66 | HR 79 | Temp 97.3°F | Ht 66.0 in | Wt 141.4 lb

## 2018-04-12 DIAGNOSIS — M109 Gout, unspecified: Secondary | ICD-10-CM

## 2018-04-12 DIAGNOSIS — N183 Chronic kidney disease, stage 3 unspecified: Secondary | ICD-10-CM

## 2018-04-12 DIAGNOSIS — Z111 Encounter for screening for respiratory tuberculosis: Secondary | ICD-10-CM

## 2018-04-12 DIAGNOSIS — I10 Essential (primary) hypertension: Secondary | ICD-10-CM | POA: Diagnosis not present

## 2018-04-12 DIAGNOSIS — E785 Hyperlipidemia, unspecified: Secondary | ICD-10-CM | POA: Diagnosis not present

## 2018-04-12 DIAGNOSIS — I4891 Unspecified atrial fibrillation: Secondary | ICD-10-CM

## 2018-04-12 NOTE — Progress Notes (Signed)
Subjective:  Daniel [name] is a 85 y.o. year old very pleasant male patient who presents for/with See problem oriented charting ROS-  No chest pain or shortness of breath. No headache or blurry vision.     Past Medical History-  Patient Active Problem List   Diagnosis Date Noted  . Atrial fibrillation (HCC) 11/04/2006    Priority: High  . Leukocytosis 08/04/2017    Priority: Medium  . BPH (benign prostatic hyperplasia) 10/31/2013    Priority: Medium  . Dyslipidemia 08/24/2006    Priority: Medium  . GOUT 08/24/2006    Priority: Medium  . Essential hypertension 08/24/2006    Priority: Medium  . CKD (chronic kidney disease), stage III (HCC) 08/24/2006    Priority: Medium  . Caregiver burden 04/06/2017    Priority: Low  . Long term (current) use of anticoagulants 12/01/2016    Priority: Low  . Encounter for therapeutic drug monitoring 07/05/2013    Priority: Low  . PULMONARY NODULE, RIGHT UPPER LOBE 11/24/2007    Priority: Low  . Syncope 11/20/2017    Medications- reviewed and updated Current Outpatient Medications  Medication Sig Dispense Refill  . allopurinol (ZYLOPRIM) 300 MG tablet Take 1 tablet (300 mg total) by mouth daily. (Patient taking differently: Take 300 mg by mouth every evening. ) 90 tablet 1  . amLODipine (NORVASC) 5 MG tablet TAKE ONE TABLET BY MOUTH DAILY (Patient taking differently: Take 5 mg by mouth daily. ) 90 tablet 2  . atorvastatin (LIPITOR) 20 MG tablet TAKE ONE TABLET BY MOUTH DAILY AT 6 PM (REPLACES SIMVASTATIN) 90 tablet 2  . brimonidine (ALPHAGAN) 0.2 % ophthalmic solution Place 1 drop into both eyes 2 (two) times daily.     . folic acid (FOLVITE) 1 MG tablet Take 1 tablet (1 mg total) by mouth daily. (Patient taking differently: Take 1 mg by mouth every evening. ) 30 tablet 2  . tamsulosin (FLOMAX) 0.4 MG CAPS capsule TAKE ONE CAPSULE BY MOUTH DAILY AFTER BREAKFAST (Patient taking differently: Take 0.4 mg by mouth daily. ) 90 capsule 1  . VOLTAREN  1 % GEL Apply 1 application topically daily as needed (for knee pain).     Marland Kitchen warfarin (COUMADIN) 2.5 MG tablet Take 1 tablet daily or TAKE AS DIRECTED BY ANTICOAGULATION CLINIC (Patient taking differently: Take 1.25-2.5 mg by mouth See admin instructions. Take 1.25 mg by mouth daily on Monday, Wednesday and Friday in the evening. Take 2.5 mg by mouth daily in the evening on Tuesday, Thursday, Saturday and Sunday) 90 tablet 1   No current facility-administered medications for this visit.     Objective: BP 120/66 (BP Location: Left Arm, Patient Position: Sitting, Cuff Size: Normal)   Pulse 79   Temp (!) 97.3 F (36.3 C) (Oral)   Ht 5\' 6"  (1.676 m)   Wt 141 lb 6.1 oz (64.1 kg)   SpO2 98%   BMI 22.82 kg/m  Gen: NAD, resting comfortably CV: RRR no murmurs rubs or gallops Lungs: CTAB no crackles, wheeze, rhonchi Abdomen: soft/nontender/nondistended Ext: no edema Skin: warm, dry Neuro: Able to walk without cane without difficulty-he reports keeps on hand in case knee hurts him.  Moves all extremities.  Normal speech.  Assessment/Plan:  Other notes: 1.  Patient plans to move in at friend's home.  Needed examination and evaluation of chronic medical issues before moving in.  Form completed-wrote pending for TB skin test  #Atrial fibrillation S: Compliant with warfarin-monitored at [place].  Rate controlled without medication. A/P:  Stable. Continue current medications.    #Dyslipidemia S: Compliant with atorvastatin 20 mg. A/P: LDL goal less than 100.  Stable. Continue current medications.  Recheck lipid panel after July 07, 2018  #Gout S: Compliant with allopurinol 300 mg.  No recent flareups. A/P:  Stable. Continue current medications.  Last uric acid at goal 6 or less.   #CKD stage III S:GFR in 30s.  Patient knows to avoid NSAIDs  A/P:  Stable.  Continue to monitor- CMP needed next visit  #Essential hypertension S:Controlled on amlodipine 5 mg A/P:  Stable. Continue current  medications.     Future Appointments  Date Time Provider Department Center  04/17/2018  1:15 PM Vivi Barrack, DPM [facility]  05/10/2018 11:00 AM [facility]   We did not schedule planned follow-up  Lab/Order associations: Encounter for tuberculin skin test - Plan: TB Skin Test  Atrial fibrillation, unspecified type (HCC)  Dyslipidemia  Gout, unspecified cause, unspecified chronicity, unspecified site  Essential hypertension  CKD (chronic kidney disease), stage III (HCC)  Return precautions advised.  Tana Conch, MD

## 2018-04-12 NOTE — Patient Instructions (Signed)
Team-please give instructions for follow-up tuberculin skin test  Please fax forms to friend's home and give him a copy as well.

## 2018-04-14 ENCOUNTER — Ambulatory Visit: Payer: PPO

## 2018-04-14 LAB — TB SKIN TEST
Induration: 0 mm
TB Skin Test: NEGATIVE

## 2018-04-17 ENCOUNTER — Ambulatory Visit: Payer: PPO | Admitting: Podiatry

## 2018-04-17 DIAGNOSIS — M79676 Pain in unspecified toe(s): Secondary | ICD-10-CM

## 2018-04-17 DIAGNOSIS — B351 Tinea unguium: Secondary | ICD-10-CM

## 2018-04-17 DIAGNOSIS — D689 Coagulation defect, unspecified: Secondary | ICD-10-CM

## 2018-04-17 NOTE — Progress Notes (Signed)
Subjective: 85 y.o. returns the office today for painful, elongated, thickened toenails which he cannot trim himself. Denies any redness or drainage around the nails. Denies any acute changes since last appointment and no new complaints today. Denies any systemic complaints such as fevers, chills, nausea, vomiting.   He is on coumadin.   Objective: AAO 3, NAD DP/PT pulses palpable, CRT less than 3 seconds Nails hypertrophic, dystrophic, elongated, brittle, discolored 10. All of the nails appear to be discolored and thickened. No open lesions or pre-ulcerative lesions are identified. No pain with calf compression, swelling, warmth, erythema. Overall, doing well with no changes   Assessment: Patient presents with symptomatic onychomycosis  Plan: -Treatment options including alternatives, risks, complications were discussed -Nails sharply debrided 10 without complication/bleeding. -Discussed daily foot inspection. If there are any changes, to call the office immediately.  -Follow-up in 3 months or sooner if any problems are to arise. In the meantime, encouraged to call the office with any questions, concerns, changes symptoms.  Ovid Curd, DPM

## 2018-05-10 ENCOUNTER — Ambulatory Visit (INDEPENDENT_AMBULATORY_CARE_PROVIDER_SITE_OTHER): Payer: PPO | Admitting: General Practice

## 2018-05-10 DIAGNOSIS — Z7901 Long term (current) use of anticoagulants: Secondary | ICD-10-CM

## 2018-05-10 DIAGNOSIS — I4891 Unspecified atrial fibrillation: Secondary | ICD-10-CM

## 2018-05-10 LAB — POCT INR: INR: 1.8 — AB (ref 2.0–3.0)

## 2018-05-10 NOTE — Progress Notes (Signed)
I have reviewed this visit and I agree on the patient's plan of dosage and recommendations. Epiphany Seltzer, DO   

## 2018-05-10 NOTE — Patient Instructions (Addendum)
Pre visit review using our clinic review tool, if applicable. No additional management support is needed unless otherwise documented below in the visit note.  Continue to take 1 tablet all days except a half tablet on Monday/Wednesday/Friday.  Re-check in 6 weeks at patient request.

## 2018-05-21 ENCOUNTER — Other Ambulatory Visit: Payer: Self-pay | Admitting: Family Medicine

## 2018-05-23 ENCOUNTER — Encounter: Payer: Self-pay | Admitting: Family Medicine

## 2018-05-23 ENCOUNTER — Ambulatory Visit: Payer: Self-pay

## 2018-05-23 ENCOUNTER — Ambulatory Visit (INDEPENDENT_AMBULATORY_CARE_PROVIDER_SITE_OTHER): Payer: PPO | Admitting: Family Medicine

## 2018-05-23 VITALS — BP 114/70 | HR 89 | Temp 97.6°F | Ht 66.0 in | Wt 141.5 lb

## 2018-05-23 DIAGNOSIS — R040 Epistaxis: Secondary | ICD-10-CM

## 2018-05-23 DIAGNOSIS — R58 Hemorrhage, not elsewhere classified: Secondary | ICD-10-CM

## 2018-05-23 DIAGNOSIS — K1379 Other lesions of oral mucosa: Secondary | ICD-10-CM

## 2018-05-23 LAB — COMPREHENSIVE METABOLIC PANEL
ALT: 20 U/L (ref 0–53)
AST: 28 U/L (ref 0–37)
Albumin: 3.8 g/dL (ref 3.5–5.2)
Alkaline Phosphatase: 214 U/L — ABNORMAL HIGH (ref 39–117)
BUN: 35 mg/dL — AB (ref 6–23)
CO2: 22 mEq/L (ref 19–32)
Calcium: 8.7 mg/dL (ref 8.4–10.5)
Chloride: 106 mEq/L (ref 96–112)
Creatinine, Ser: 1.96 mg/dL — ABNORMAL HIGH (ref 0.40–1.50)
GFR: 32.78 mL/min — ABNORMAL LOW (ref >60.00)
Glucose, Bld: 104 mg/dL — ABNORMAL HIGH (ref 70–99)
Potassium: 4.5 mEq/L (ref 3.5–5.1)
Sodium: 138 mEq/L (ref 135–145)
Total Bilirubin: 0.5 mg/dL (ref 0.2–1.2)
Total Protein: 7.6 g/dL (ref 6.0–8.3)

## 2018-05-23 LAB — PROTIME-INR
INR: 1.1 ratio — ABNORMAL HIGH (ref 0.8–1.0)
Prothrombin Time: 13.4 s — ABNORMAL HIGH (ref 9.6–13.1)

## 2018-05-23 NOTE — Progress Notes (Signed)
Patient: Daniel [name] MRN: [id number] DOB: 1933/02/24 PCP: Shelva Majestic, MD     Subjective:  Chief Complaint  Patient presents with  . Body and mouth brusies    x 1week or more    HPI: The patient is a 85 y.o. male who presents today for bruising and bleeding x 1 week or longer. He stopped coumadin 2-3 days ago on his own accord. He thinks he is having a lot more bruising all over his body. He has not fallen but has huge ecchymosis all over his arms, hands, legs. He states he may have bumped into something. He has been on coumadin x 15 years and never had bruising like this before. He denies any shortness of breath or fatigue. He has no blood in his urine or stool. He also has some purple on his lower lip and a mass in his mouth he wants me to drain. He also has had a nose bleed that seems to be coming out a lot. He has no nose bleed right now. This has been going on x 15 years.   Review of Systems  Allergies Patient is allergic to ciprofloxacin; robaxin [methocarbamol]; and iron.  Past Medical History Patient  has a past medical history of ACUT DUOD ULCER W/HEMORR W/O MENTION OBSTRUCTION (11/24/2007), Anemia, Arthritis, Colon polyps, Dysrhythmia, Glaucoma, Gout, H/O epistaxis, Hyperlipidemia, Hypertension, Patella fracture, Pulmonary nodule, right, Renal insufficiency, Stroke (HCC), and Wears dentures.  Surgical History Patient  has a past surgical history that includes Cataract extraction; Band hemorrhoidectomy; and Knee arthroscopy with patella reconstruction (Left, 07/16/2014).  Family History Pateint's family history is not on file.  Social History Patient  reports that he has quit smoking. His smoking use included cigarettes. He has never used smokeless tobacco. He reports that he does not drink alcohol.    Objective: Vitals:   05/23/18 1121  BP: 114/70  Pulse: 89  Temp: 97.6 F (36.4 C)  TempSrc: Oral  SpO2: 96%  Weight: 141 lb 8 oz (64.2 kg)  Height: 5\' 6"   (1.676 m)    Body mass index is 22.84 kg/m.  Physical Exam Vitals signs reviewed.  Constitutional:      Appearance: Normal appearance.  HENT:     Right Ear: Tympanic membrane normal.     Left Ear: Tympanic membrane normal.     Nose:     Comments: Clotted blood bilateral nares. No active bleeding.     Mouth/Throat:     Comments: Small petechiae type lesions over mucosa of mouth.  Large lesion on left buccal area. Hard to palpation with irregular/rough appearance.  Eyes:     Extraocular Movements: Extraocular movements intact.     Pupils: Pupils are equal, round, and reactive to light.  Neck:     Musculoskeletal: Normal range of motion and neck supple.  Cardiovascular:     Rate and Rhythm: Normal rate.     Heart sounds: Normal heart sounds.  Pulmonary:     Effort: Pulmonary effort is normal.     Breath sounds: Normal breath sounds.  Abdominal:     General: Abdomen is flat. Bowel sounds are normal.     Palpations: Abdomen is soft.  Lymphadenopathy:     Cervical: No cervical adenopathy.  Skin:    Comments: Large ecchymoses over arms. Hematoma over right elbow. He has bruising in his palms, hands, arms legs as well as his lips  Neurological:     General: No focal deficit present.     Mental  Status: He is alert and oriented to person, place, and time.  Psychiatric:        Mood and Affect: Mood normal.        Behavior: Behavior normal.        Assessment/plan: 1. Bleeding Concern for excessive bruising/hematoma and areas in mouth/lips. Concern for hematological malignancy vs. INR issue for clotting disorder. Checking labs and will go from there. No bleeding in office. Holding coumadin. Very strict ER precautions given and will have PCP follow up with him.  - Protime-INR - Pathologist smear review - CBC with Differential/Platelet - Comprehensive metabolic panel  2. Epistaxis None at this time and clotted in nose. Concern for bleeding disorder and checking labs. Holding  coumadin. If nose bleeds again will need to go to ER.  - Ambulatory referral to ENT  3. Ecchymosis Concern for bleeding issue vs. Blood malignancy. Checking labs/smear and holding coumadin.  - Pathologist smear review  4. Mass of mouth Concern for malignancy. ENT referral placed stat. He could have just bit his lip, but it is getting large and has irregular appearance to it. Doing lab work up on his bruising and will need biopsy of this lesion.      Return in about 1 week (around 05/30/2018) for bleeding. Orland Mustard, MD [facility]   05/23/2018

## 2018-05-23 NOTE — Telephone Encounter (Signed)
Pt c/o bruising to face lips arms and legs, intermittent nosebleed for 2 days. Pt has stopped his warfarin 3 days ago. Pt stated there is a lump in his mouth that she feels lthe doctor needs to drain.  Pt has applied a tamponade of paper tissues to help stop the bleeding. Denies dizziness or lightheadedness.  Pt care advice given to pt and pt verbalized understanding. PCP no openings, appt made with Dr Artis Flock.  Reason for Disposition . [1] Skin bruises or bleeding gums AND [2] not caused by an injury . Taking Coumadin (warfarin) or other strong blood thinner, or known bleeding disorder (e.g., thrombocytopenia)  Answer Assessment - Initial Assessment Questions 1. AMOUNT OF BLEEDING: "How bad is the bleeding?" "How much blood was lost?" "Has the bleeding stopped?"   - MILD: needed a couple tissues   - MODERATE: needed many tissues   - SEVERE: large blood clots, soaked many tissues, lasted more than 30 minutes     severe 2. ONSET: "When did the nosebleed start?"      2 days 3. FREQUENCY: "How many nosebleeds have you had in the last 24 hours?"      continuous 4. RECURRENT SYMPTOMS: "Have there been other recent nosebleeds?" If so, ask: "How long did it take you to stop the bleeding?" "What worked best?"      No- applying a tamponade of tissue has not stopped  5. CAUSE: "What do you think caused this nosebleed?"     warfarin 6. LOCAL FACTORS: "Do you have any cold symptoms?", "Have you been rubbing or picking at your nose?"     no 7. SYSTEMIC FACTORS: "Do you have high blood pressure or any bleeding problems?"     Stopped anticoagulant 3 days ago 8. BLOOD THINNERS: "Do you take any blood thinners?" (e.g., coumadin, heparin, aspirin, Plavix)     warfarin 9. OTHER SYMPTOMS: "Do you have any other symptoms?" (e.g., lightheadedness)     no 10. PREGNANCY: "Is there any chance you are pregnant?" "When was your last menstrual period?"       n/a  Protocols used: NOSEBLEED-A-AH

## 2018-05-23 NOTE — Patient Instructions (Signed)
Checking labs and then I did a stat referral to ENT.  If you start bleeding again you will need to go to ER. Hold coumadin

## 2018-05-24 ENCOUNTER — Inpatient Hospital Stay (HOSPITAL_COMMUNITY)
Admission: EM | Admit: 2018-05-24 | Discharge: 2018-05-27 | DRG: 813 | Disposition: A | Discharge status: Home / Self Care | Payer: PPO | Attending: Family Medicine | Admitting: Family Medicine

## 2018-05-24 ENCOUNTER — Telehealth: Payer: Self-pay | Admitting: Internal Medicine

## 2018-05-24 ENCOUNTER — Encounter (HOSPITAL_COMMUNITY): Payer: Self-pay | Admitting: *Deleted

## 2018-05-24 ENCOUNTER — Telehealth: Payer: Self-pay | Admitting: Family Medicine

## 2018-05-24 ENCOUNTER — Other Ambulatory Visit: Payer: Self-pay

## 2018-05-24 DIAGNOSIS — N183 Chronic kidney disease, stage 3 unspecified: Secondary | ICD-10-CM | POA: Diagnosis present

## 2018-05-24 DIAGNOSIS — D693 Immune thrombocytopenic purpura: Secondary | ICD-10-CM | POA: Diagnosis not present

## 2018-05-24 DIAGNOSIS — T380X5A Adverse effect of glucocorticoids and synthetic analogues, initial encounter: Secondary | ICD-10-CM | POA: Diagnosis present

## 2018-05-24 DIAGNOSIS — N400 Enlarged prostate without lower urinary tract symptoms: Secondary | ICD-10-CM | POA: Diagnosis not present

## 2018-05-24 DIAGNOSIS — Z7901 Long term (current) use of anticoagulants: Secondary | ICD-10-CM

## 2018-05-24 DIAGNOSIS — K068 Other specified disorders of gingiva and edentulous alveolar ridge: Secondary | ICD-10-CM | POA: Diagnosis present

## 2018-05-24 DIAGNOSIS — Z87891 Personal history of nicotine dependence: Secondary | ICD-10-CM

## 2018-05-24 DIAGNOSIS — Z862 Personal history of diseases of the blood and blood-forming organs and certain disorders involving the immune mechanism: Secondary | ICD-10-CM | POA: Diagnosis present

## 2018-05-24 DIAGNOSIS — D696 Thrombocytopenia, unspecified: Secondary | ICD-10-CM | POA: Diagnosis present

## 2018-05-24 DIAGNOSIS — D72829 Elevated white blood cell count, unspecified: Secondary | ICD-10-CM | POA: Diagnosis present

## 2018-05-24 DIAGNOSIS — I129 Hypertensive chronic kidney disease with stage 1 through stage 4 chronic kidney disease, or unspecified chronic kidney disease: Secondary | ICD-10-CM | POA: Diagnosis not present

## 2018-05-24 DIAGNOSIS — D631 Anemia in chronic kidney disease: Secondary | ICD-10-CM | POA: Diagnosis present

## 2018-05-24 DIAGNOSIS — Z79899 Other long term (current) drug therapy: Secondary | ICD-10-CM | POA: Diagnosis not present

## 2018-05-24 DIAGNOSIS — I480 Paroxysmal atrial fibrillation: Secondary | ICD-10-CM | POA: Diagnosis not present

## 2018-05-24 DIAGNOSIS — H409 Unspecified glaucoma: Secondary | ICD-10-CM | POA: Diagnosis not present

## 2018-05-24 DIAGNOSIS — I10 Essential (primary) hypertension: Secondary | ICD-10-CM | POA: Diagnosis not present

## 2018-05-24 DIAGNOSIS — Z8673 Personal history of transient ischemic attack (TIA), and cerebral infarction without residual deficits: Secondary | ICD-10-CM | POA: Diagnosis not present

## 2018-05-24 DIAGNOSIS — M109 Gout, unspecified: Secondary | ICD-10-CM | POA: Diagnosis not present

## 2018-05-24 DIAGNOSIS — E785 Hyperlipidemia, unspecified: Secondary | ICD-10-CM | POA: Diagnosis not present

## 2018-05-24 DIAGNOSIS — Z888 Allergy status to other drugs, medicaments and biological substances status: Secondary | ICD-10-CM

## 2018-05-24 DIAGNOSIS — I4819 Other persistent atrial fibrillation: Secondary | ICD-10-CM | POA: Diagnosis not present

## 2018-05-24 DIAGNOSIS — R58 Hemorrhage, not elsewhere classified: Secondary | ICD-10-CM

## 2018-05-24 DIAGNOSIS — Z881 Allergy status to other antibiotic agents status: Secondary | ICD-10-CM | POA: Diagnosis not present

## 2018-05-24 DIAGNOSIS — Z8719 Personal history of other diseases of the digestive system: Secondary | ICD-10-CM | POA: Diagnosis not present

## 2018-05-24 DIAGNOSIS — I4891 Unspecified atrial fibrillation: Secondary | ICD-10-CM | POA: Diagnosis present

## 2018-05-24 LAB — CBC WITH DIFFERENTIAL/PLATELET
Absolute Monocytes: 1195 cells/uL — ABNORMAL HIGH (ref 200–950)
BAND NEUTROPHILS: 0 %
Basophils Absolute: 56 cells/uL (ref 0–200)
Basophils Absolute: 0.1 10*3/uL (ref 0.0–0.1)
Basophils Relative: 0.4 %
Basophils Relative: 1 %
Blasts: 0 %
Eosinophils Absolute: 181 cells/uL (ref 15–500)
Eosinophils Absolute: 0.3 10*3/uL (ref 0.0–0.5)
Eosinophils Relative: 1.3 %
Eosinophils Relative: 2 %
HCT: 35.2 % — ABNORMAL LOW (ref 38.5–50.0)
HCT: 35.9 % — ABNORMAL LOW (ref 39.0–52.0)
Hemoglobin: 12.1 g/dL — ABNORMAL LOW (ref 13.2–17.1)
Hemoglobin: 11.4 g/dL — ABNORMAL LOW (ref 13.0–17.0)
Lymphocytes Relative: 26 %
Lymphs Abs: 3058 cells/uL (ref 850–3900)
Lymphs Abs: 3.7 10*3/uL (ref 0.7–4.0)
MCH: 32 pg (ref 27.0–33.0)
MCH: 31.5 pg (ref 26.0–34.0)
MCHC: 34.4 g/dL (ref 32.0–36.0)
MCHC: 31.8 g/dL (ref 30.0–36.0)
MCV: 93.1 fL (ref 80.0–100.0)
MCV: 99.2 fL (ref 80.0–100.0)
METAMYELOCYTES PCT: 0 %
MONO ABS: 1.3 10*3/uL — AB (ref 0.1–1.0)
Monocytes Relative: 8.6 %
Monocytes Relative: 9 %
Myelocytes: 0 %
Neutro Abs: 9410 cells/uL — ABNORMAL HIGH (ref 1500–7800)
Neutro Abs: 9 10*3/uL — ABNORMAL HIGH (ref 1.7–7.7)
Neutrophils Relative %: 67.7 %
Neutrophils Relative %: 62 %
Other: 0 %
Platelets: 4 10*3/uL — CL (ref 140–400)
Platelets: 22 10*3/uL — CL (ref 150–400)
Promyelocytes Relative: 0 %
RBC: 3.78 10*6/uL — ABNORMAL LOW (ref 4.20–5.80)
RBC: 3.62 MIL/uL — ABNORMAL LOW (ref 4.22–5.81)
RDW: 14.1 % (ref 11.0–15.0)
RDW: 14.7 % (ref 11.5–15.5)
Total Lymphocyte: 22 %
WBC: 13.9 10*3/uL — ABNORMAL HIGH (ref 3.8–10.8)
WBC: 14.4 10*3/uL — ABNORMAL HIGH (ref 4.0–10.5)
nRBC: 0 % (ref 0.0–0.2)
nRBC: 0 /100 WBC

## 2018-05-24 LAB — COMPREHENSIVE METABOLIC PANEL
ALT: 23 U/L (ref 0–44)
AST: 30 U/L (ref 15–41)
Albumin: 3.6 g/dL (ref 3.5–5.0)
Alkaline Phosphatase: 180 U/L — ABNORMAL HIGH (ref 38–126)
Anion gap: 10 (ref 5–15)
BUN: 34 mg/dL — AB (ref 8–23)
CO2: 21 mmol/L — ABNORMAL LOW (ref 22–32)
Calcium: 9.1 mg/dL (ref 8.9–10.3)
Chloride: 107 mmol/L (ref 98–111)
Creatinine, Ser: 2.1 mg/dL — ABNORMAL HIGH (ref 0.61–1.24)
GFR calc Af Amer: 33 mL/min — ABNORMAL LOW (ref >60)
GFR, EST NON AFRICAN AMERICAN: 28 mL/min — AB (ref >60)
Glucose, Bld: 105 mg/dL — ABNORMAL HIGH (ref 70–99)
Potassium: 4.1 mmol/L (ref 3.5–5.1)
Sodium: 138 mmol/L (ref 135–145)
Total Bilirubin: 0.8 mg/dL (ref 0.3–1.2)
Total Protein: 8.1 g/dL (ref 6.5–8.1)

## 2018-05-24 LAB — CBC
HCT: 36.8 % — ABNORMAL LOW (ref 39.0–52.0)
Hemoglobin: 12.3 g/dL — ABNORMAL LOW (ref 13.0–17.0)
MCH: 33 pg (ref 26.0–34.0)
MCHC: 33.4 g/dL (ref 30.0–36.0)
MCV: 98.7 fL (ref 80.0–100.0)
Platelets: 2 10*3/uL — CL (ref 150–400)
RBC: 3.73 MIL/uL — ABNORMAL LOW (ref 4.22–5.81)
RDW: 14.8 % (ref 11.5–15.5)
WBC: 12.3 10*3/uL — ABNORMAL HIGH (ref 4.0–10.5)
nRBC: 0 % (ref 0.0–0.2)

## 2018-05-24 LAB — TYPE AND SCREEN
ABO/RH(D): O POS
Antibody Screen: NEGATIVE

## 2018-05-24 LAB — LACTATE DEHYDROGENASE: LDH: 183 U/L (ref 98–192)

## 2018-05-24 LAB — VITAMIN B12: Vitamin B-12: 399 pg/mL (ref 180–914)

## 2018-05-24 LAB — APTT: aPTT: 34 seconds (ref 24–36)

## 2018-05-24 LAB — IMMATURE PLATELET FRACTION: Immature Platelet Fraction: 17.9 % — ABNORMAL HIGH (ref 1.2–8.6)

## 2018-05-24 LAB — FIBRINOGEN: FIBRINOGEN: 539 mg/dL — AB (ref 210–475)

## 2018-05-24 LAB — PATHOLOGIST SMEAR REVIEW

## 2018-05-24 LAB — SAVE SMEAR(SSMR), FOR PROVIDER SLIDE REVIEW

## 2018-05-24 LAB — PROTIME-INR
INR: 1.1 (ref 0.8–1.2)
Prothrombin Time: 14.2 seconds (ref 11.4–15.2)

## 2018-05-24 MED ORDER — FOLIC ACID 1 MG PO TABS
1.0000 mg | ORAL_TABLET | Freq: Every day | ORAL | Status: DC
  Administered 2018-05-24 – 2018-05-27 (×4): 1 mg via ORAL
  Filled 2018-05-24 (×5): qty 1

## 2018-05-24 MED ORDER — AMLODIPINE BESYLATE 5 MG PO TABS
5.0000 mg | ORAL_TABLET | Freq: Every day | ORAL | Status: DC
  Administered 2018-05-24 – 2018-05-27 (×4): 5 mg via ORAL
  Filled 2018-05-24 (×5): qty 1

## 2018-05-24 MED ORDER — TAMSULOSIN HCL 0.4 MG PO CAPS
0.4000 mg | ORAL_CAPSULE | Freq: Every day | ORAL | Status: DC
  Administered 2018-05-25 – 2018-05-27 (×3): 0.4 mg via ORAL
  Filled 2018-05-24 (×3): qty 1

## 2018-05-24 MED ORDER — ONDANSETRON HCL 4 MG/2ML IJ SOLN: 4.0000 mg | Freq: Four times a day (QID) | INTRAMUSCULAR | Status: DC | PRN

## 2018-05-24 MED ORDER — ACETAMINOPHEN 325 MG PO TABS: 650.0000 mg | ORAL_TABLET | Freq: Four times a day (QID) | ORAL | Status: DC | PRN

## 2018-05-24 MED ORDER — IMMUNE GLOBULIN (HUMAN) 5 GM/50ML IV SOLN
1.0000 g/kg | INTRAVENOUS | Status: AC
  Administered 2018-05-24 – 2018-05-25 (×2): 65 g via INTRAVENOUS
  Filled 2018-05-24 (×2): qty 50

## 2018-05-24 MED ORDER — ONDANSETRON HCL 4 MG PO TABS: 4.0000 mg | ORAL_TABLET | Freq: Four times a day (QID) | ORAL | Status: DC | PRN

## 2018-05-24 MED ORDER — ACETAMINOPHEN 650 MG RE SUPP: 650.0000 mg | Freq: Four times a day (QID) | RECTAL | Status: DC | PRN

## 2018-05-24 MED ORDER — IMMUNE GLOBULIN (HUMAN) 10 GM/100ML IV SOLN: 1.0000 g/kg | INTRAVENOUS | Status: DC

## 2018-05-24 MED ORDER — TRAZODONE HCL 50 MG PO TABS: 25.0000 mg | ORAL_TABLET | Freq: Every evening | ORAL | Status: DC | PRN

## 2018-05-24 MED ORDER — ACETAMINOPHEN 500 MG PO TABS
1000.0000 mg | ORAL_TABLET | Freq: Once | ORAL | Status: AC | PRN
  Administered 2018-05-24 – 2018-05-25 (×2): 1000 mg via ORAL
  Filled 2018-05-24 (×2): qty 2

## 2018-05-24 MED ORDER — SODIUM CHLORIDE 0.9 % IV SOLN
10.0000 mL/h | Freq: Once | INTRAVENOUS | Status: AC
  Administered 2018-05-24: 10 mL/h via INTRAVENOUS

## 2018-05-24 MED ORDER — DIPHENHYDRAMINE HCL 25 MG PO CAPS
25.0000 mg | ORAL_CAPSULE | Freq: Once | ORAL | Status: AC | PRN
  Administered 2018-05-24 – 2018-05-25 (×2): 25 mg via ORAL
  Filled 2018-05-24 (×2): qty 1

## 2018-05-24 MED ORDER — BRIMONIDINE TARTRATE 0.2 % OP SOLN
1.0000 [drp] | Freq: Two times a day (BID) | OPHTHALMIC | Status: DC
  Administered 2018-05-24 – 2018-05-27 (×6): 1 [drp] via OPHTHALMIC
  Filled 2018-05-24: qty 5

## 2018-05-24 MED ORDER — ONDANSETRON HCL 4 MG PO TABS
4.0000 mg | ORAL_TABLET | Freq: Once | ORAL | Status: DC | PRN
  Administered 2018-05-24: 4 mg via ORAL
  Filled 2018-05-24: qty 1

## 2018-05-24 MED ORDER — DEXAMETHASONE 4 MG PO TABS
20.0000 mg | ORAL_TABLET | Freq: Every day | ORAL | Status: AC
  Administered 2018-05-24 – 2018-05-27 (×4): 20 mg via ORAL
  Filled 2018-05-24 (×4): qty 5

## 2018-05-24 NOTE — ED Notes (Signed)
Pharmacy remains at bedside
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
## 2018-05-24 NOTE — Telephone Encounter (Signed)
Team- I don't see he has gone to the ER- please call to check on him- he needs to go to the emergency room- we may need to call EMS if we cannot get in contact with him this morning

## 2018-05-24 NOTE — ED Notes (Signed)
Bed: WA11 Expected date:  Expected time:  Means of arrival:  Comments: Tr 2 
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
## 2018-05-24 NOTE — Telephone Encounter (Signed)
Spoke with patient at great length.  After much coaxing, patient has agreed to go to the ED, but has insisted that he has to speak with his neighbor first to make sure his mail is going to be picked up while he is in the hospital.  He first wanted to "wait a day or two" before going to the hospital.  I advised that would not be a good idea and that his condition was very serious and could ultimately result in death.  Patient stated as we were talking on the phone that his nose began to bleed.  I asked if he was able to drive himself to the hospital and he stated he is able.  I asked if he would like an ambulance sent to his home and he stated he "did not want to pay for that".  Before hanging up, I advised patient to dial 911 if his symptoms worsened in any way.  He assured me that he would "get to the emergency room some time today". 

## 2018-05-24 NOTE — Telephone Encounter (Signed)
Routing to PCP for FYI

## 2018-05-24 NOTE — Telephone Encounter (Signed)
Spoke with patient at great length.  After much coaxing, patient has agreed to go to the ED, but has insisted that he has to speak with his neighbor first to make sure his mail is going to be picked up while he is in the hospital.  He first wanted to "wait a day or two" before going to the hospital.  I advised that would not be a good idea and that his condition was very serious and could ultimately result in death.  Patient stated as we were talking on the phone that his nose began to bleed.  I asked if he was able to drive himself to the hospital and he stated he is able.  I asked if he would like an ambulance sent to his home and he stated he "did not want to pay for that".  Before hanging up, I advised patient to dial 911 if his symptoms worsened in any way.  He assured me that he would "get to the emergency room some time today".

## 2018-05-24 NOTE — ED Notes (Signed)
Date and time results received: 05/24/18 1506 (use smartphrase ".now" to insert current time)  Test: PLT Critical Value: 2  Name of Provider Notified: Dr. Roselyn Bering  Orders Received? Or Actions Taken?: Actions Taken: notified Dr Roselyn Bering PLT 2.

## 2018-05-24 NOTE — Telephone Encounter (Signed)
[facility] - [facility] TELEPHONE ADVICE RECORD [facility] Patient Name: Daniel [name] Gender: Male DOB: [birth year]  Age: 85 Y 7 M 13 D Return Phone Number: [phone] (Primary) Address:  City/State/Zip: [place]  [address] Client [facility] Night Physician Daniel [name]- MD Contact Type Call Who Is Calling Lab Lab Name [facility] Phone Number [phone] Lab Tech Name Daniel [name] Lab Reference Number [id number] j Chief Complaint Lab Result (Critical or Stat) Call Type Lab Send to RN Reason for Call Report lab results Initial Comment Caller states he is Daniel [name] from [facility]. Dr is Daniel [name]. With critical value. Additional Comment Translation No Nurse Assessment Nurse: Daniel Fanny, RN, Daniel [name] Date/Time (Eastern Time): 05/24/2018 3:14:56 AM Is there an on-call provider listed? ---Yes Please list name of person reporting value (Lab Employee) and a contact number. ---Daniel [name] at [phone] Please document the following items: Lab name Lab value (read back to lab to verify) Reference range for lab value Date and time blood was drawn Collect time of birth for bilirubin results ---Lab is platelets of 4000; verified by repeat analysis Reference ; 140,000-400,000 Drawn on 05/23/2018 at 1230 PM Please collect the patient contact information from the lab. (name, phone number and address) ---Pts phone [phone] Address is [address] [place] [address] Guidelines Guideline Title Affirmed Question Affirmed Notes Nurse Date/Time Daniel [name] Time) Disp. Time Daniel [name] Time) Disposition Final User 05/24/2018 3:27:00 AM Paged On Call back to [facility], RN, Daniel [name] 05/24/2018 4:17:59 AM Paged On Call back to [facility], RN, Daniel [name] 05/24/2018 4:21:45 AM Lab Call Daniel Fanny, RN, Daniel [name] Reason: Lab called to report a critical platelet  count 05/24/2018 4:25:01 AM Clinical Call Yes Daniel Fanny, RN, Daniel [name] PLEASE NOTE:  All timestamps contained within this report are represented as [place] Standard Time. CONFIDENTIALTY NOTICE: This fax transmission is intended only for the addressee.  It contains information that is legally privileged, confidential or otherwise protected from use or disclosure.  If you are not the intended recipient, you are strictly prohibited from reviewing, disclosing, copying using or disseminating any of this information or taking any action in reliance on or regarding this information.  If you have received this fax in error, please notify us immediately by telephone so that we can arrange for its return to [place]. Phone:  [phone], Toll-Free:  [phone], Fax:  [phone] Page: 2 of 2 Call Id: [id number] Comments User: Daniel Furlong, RN Date/Time Daniel [name] Time): 05/24/2018 4:14:12 AM Pt was called per MD instructions and informed that MD advised the pt should go to ED. The pt refused and said he is in Gods hands and he stated it is costing him too much money to got Ed. Md stated the pt could die from bleeding and pt was told that per MDs instructions, but pt was adamant about not going to ED. Paging DoctorName Phone DateTime Result/Outcome Message Type Notes Daniel [name] - MD [phone] 05/24/2018 3:27:00 AM Paged On Call Back to Call Center Doctor Paged Hello! This is Sport and exercise psychologist with [employer]. Please call me back at [phone] Daniel [name] - MD 05/24/2018 4:16:05 AM Spoke with On Call - General Message Result Spoke to MD and he advised to call the pt and advise him to go to ED right now. Pt was called back and advised to got to Ed now related to low platelet count and recent nose bleed. Per Md  pt was told he could die. Pt refused to go to Ed. Daniel [name] - [place] [phone] 05/24/2018 4:17:59 AM Paged On Call Back to Call Center Doctor Paged please call [phone] Daniel [name] - MD 05/24/2018 4:24:53 AM Spoke with On  Call - General Message Result Called MD back and spoke to him and explained that the pt refused to go the ED now as advised. Md was informed that the nurse informed the pt that the situation was serious one that although the nose bleed has stopped, he could still be bleeding internally and that he could die. Pt still refused to go to Ed and MD was informed of the pts refusal. MD reported he will report to pts own Dr. in the morning.

## 2018-05-24 NOTE — Telephone Encounter (Signed)
Called with critical lab, platelet count 4, previously normal. Other labs abnormal but near baseline. Pt was seen w/ bleeding, epistaxis, echymosis . Advised Nurse Erskine Squibb from [facility] to call pt an direct him to the ER now due to risk of severe bleeding with poor outcome. All contact numbers available in Epic provided. Rec to call me back if I need to be involved  JP

## 2018-05-24 NOTE — Telephone Encounter (Signed)
Dr. Drue Novel- thank you so much for caring for him this morning- really appreciate your care and expertise. Dr. Artis Flock- thanks for seeing patient yesterday.

## 2018-05-24 NOTE — ED Triage Notes (Signed)
Pt went to his doctor yesterday for a routine blood work.  He received a call today re a low platelet count of 4.  Pt presents with several large bruises on his arms, and a couple bruises on his upper and lower lips.  He was taking Warfarin, but stopped 3 days ago.  No sob, cp, or lightheadedness.  He is A&Ox 4, in NAD.

## 2018-05-24 NOTE — ED Notes (Signed)
Pt has no signs or symptoms of transfusion reaction.

## 2018-05-24 NOTE — ED Notes (Signed)
Lab called to add on all other ordered labs; will draw any additional labs required

## 2018-05-24 NOTE — Telephone Encounter (Signed)
FYI  Physician from emergency room called to verify pt medications. Pt informed physician that he was taking a diuretic. I informed physician pt last hx of using that was in 2008. Physician stated that pt may be getting his Rx confused with Flomax. Physician also wanted to informed Dr. Durene Cal that pt has stop taking Warfarin due to his nose bleed.

## 2018-05-24 NOTE — Telephone Encounter (Signed)
We know he stopped coumadin- thankfully since his platelets were 4- he is very likely going to need a transfusion with ongoing bleeding if low platelets confirmed.

## 2018-05-25 DIAGNOSIS — D696 Thrombocytopenia, unspecified: Secondary | ICD-10-CM

## 2018-05-25 LAB — COMPREHENSIVE METABOLIC PANEL
ALT: 24 U/L (ref 0–44)
AST: 34 U/L (ref 15–41)
Albumin: 3.5 g/dL (ref 3.5–5.0)
Alkaline Phosphatase: 169 U/L — ABNORMAL HIGH (ref 38–126)
Anion gap: 8 (ref 5–15)
BUN: 37 mg/dL — AB (ref 8–23)
CO2: 20 mmol/L — ABNORMAL LOW (ref 22–32)
Calcium: 9.2 mg/dL (ref 8.9–10.3)
Chloride: 105 mmol/L (ref 98–111)
Creatinine, Ser: 2.09 mg/dL — ABNORMAL HIGH (ref 0.61–1.24)
GFR calc Af Amer: 33 mL/min — ABNORMAL LOW (ref >60)
GFR calc non Af Amer: 28 mL/min — ABNORMAL LOW (ref >60)
Glucose, Bld: 175 mg/dL — ABNORMAL HIGH (ref 70–99)
POTASSIUM: 4.2 mmol/L (ref 3.5–5.1)
Sodium: 133 mmol/L — ABNORMAL LOW (ref 135–145)
Total Bilirubin: 0.6 mg/dL (ref 0.3–1.2)
Total Protein: 9.3 g/dL — ABNORMAL HIGH (ref 6.5–8.1)

## 2018-05-25 LAB — CBC
HCT: 33.8 % — ABNORMAL LOW (ref 39.0–52.0)
Hemoglobin: 10.7 g/dL — ABNORMAL LOW (ref 13.0–17.0)
MCH: 31.6 pg (ref 26.0–34.0)
MCHC: 31.7 g/dL (ref 30.0–36.0)
MCV: 99.7 fL (ref 80.0–100.0)
Platelets: 67 10*3/uL — ABNORMAL LOW (ref 150–400)
RBC: 3.39 MIL/uL — ABNORMAL LOW (ref 4.22–5.81)
RDW: 14.5 % (ref 11.5–15.5)
WBC: 19.4 10*3/uL — AB (ref 4.0–10.5)
nRBC: 0 % (ref 0.0–0.2)

## 2018-05-25 LAB — DIFFERENTIAL
Basophils Absolute: 0 10*3/uL (ref 0.0–0.1)
Basophils Relative: 0 %
EOS PCT: 0 %
Eosinophils Absolute: 0 10*3/uL (ref 0.0–0.5)
LYMPHS PCT: 6 %
Lymphs Abs: 1.1 10*3/uL (ref 0.7–4.0)
Monocytes Absolute: 0.2 10*3/uL (ref 0.1–1.0)
Monocytes Relative: 1 %
Neutro Abs: 17.8 10*3/uL — ABNORMAL HIGH (ref 1.7–7.7)
Neutrophils Relative %: 92 %

## 2018-05-25 LAB — PREPARE PLATELET PHERESIS: Unit division: 0

## 2018-05-25 LAB — FOLATE RBC
Folate, Hemolysate: 446 ng/mL
Folate, RBC: 1308 ng/mL (ref >498)
Hematocrit: 34.1 % — ABNORMAL LOW (ref 37.5–51.0)

## 2018-05-25 LAB — BPAM PLATELET PHERESIS
BLOOD PRODUCT EXPIRATION DATE: 202003062359
ISSUE DATE / TIME: 202003041544
Unit Type and Rh: 6200

## 2018-05-25 LAB — PROTIME-INR
INR: 1.2 (ref 0.8–1.2)
Prothrombin Time: 14.7 seconds (ref 11.4–15.2)

## 2018-05-25 MED ORDER — ATORVASTATIN CALCIUM 20 MG PO TABS
20.0000 mg | ORAL_TABLET | Freq: Every day | ORAL | Status: DC
  Administered 2018-05-25 – 2018-05-26 (×2): 20 mg via ORAL
  Filled 2018-05-25 (×2): qty 1

## 2018-05-25 MED ORDER — SODIUM CHLORIDE 0.9% IV SOLUTION: Freq: Once | INTRAVENOUS | Status: DC

## 2018-05-25 NOTE — Progress Notes (Signed)
PROGRESS NOTE    Daniel [name]  GQQ:[id number] DOB: Feb 12, 1933 DOA: 05/24/2018 PCP: Marin Olp, MD   Brief Narrative: Daniel [name] is a 85 y.o. male with a history of GI Bleed remotely, PAF, Gout, Glaucoma, hyperlipidemia, hypertension, Right upper lobe pulmonary nodule remotely, CKD, stroke. He presented secondary to an abnormal lab of 4,000 platelets concerning for ITP.   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 Recommend 1 unit of platelets for platelet count < 10k. Started on IVIG and decadron. Several bruises, no current bleeding -Heme/onc recommendations: Continue IVIG/decadron/transfusions as needed  Essential hypertension -Continue amlodipine  Atrial fibrillation Patient is on Coumadin as an outpatient. Not on rate control medications. Currently, rate is controlled. -Hold Coumadin for platelet count <50k  CKD stage III Baseline creatinine of about 2. Stable.  Anemia Chronic. Mild. Hemoglobin down to 10.6 today. Possibly dilutional. Iron panel from about 1 year ago -CBC in AM  Hyperlipidemia -Continue Lipitor  Gout Asymptomatic currently. On allopurinol as an outpatient.   Leukocytosis Chronically elevated of unknown etiology. Patient has been worked up as an outpatient, including bone marrow biopsy. Currently down to wnl.   DVT prophylaxis: SCDs Code Status:   Code Status: Full Code Family Communication: None at bedside Disposition Plan: Home pending hematology recommendations/management   Consultants:   Hematology/oncology  Procedures:   None  Antimicrobials:  None    Subjective: No concerns today. No headaches.  Objective: Vitals:   05/25/18 1145 05/25/18 1216 05/25/18 1352  05/25/18 1430  BP: 122/60 (!) 123/59  119/60  Pulse: 60 72 87 72  Resp: 16 16  16   Temp: 97.6 F (36.4 C) (!) 97.4 F (36.3 C) (!) 96.7 F (35.9 C) 97.6 F (36.4 C)  TempSrc: Oral Oral Axillary Axillary  SpO2: 97% 97% 97% 98%  Weight:      Height:        Intake/Output Summary (Last 24 hours) at 05/25/2018 1442 Last data filed at 05/25/2018 1400 Gross per 24 hour  Intake 2391.92 ml  Output 1025 ml  Net 1366.92 ml   Filed Weights   05/24/18 1859  Weight: 64.2 kg    Examination:  General exam: Appears calm and comfortable  Respiratory system: Clear to auscultation. Respiratory effort normal. Cardiovascular system: S1 & S2 heard, RRR. No murmurs, rubs, gallops or clicks. Gastrointestinal system: Abdomen is nondistended, soft and nontender. No organomegaly or masses felt. Normal bowel sounds heard. Central nervous system: Alert and oriented. No focal neurological deficits. CN intact Extremities: No edema. No calf tenderness Skin: No cyanosis. Multiple areas of ecchymosis and petechiae on legs Psychiatry: Judgement and insight appear normal. Mood & affect appropriate.     Data Reviewed: I have personally reviewed following labs and imaging studies  CBC: Recent Labs  Lab 05/23/18 1229 05/24/18 1402 05/24/18 1419 05/24/18 1823 05/25/18 0339  WBC 13.9* 12.3*  --  14.4* 6.7  NEUTROABS 9,410*  --   --  9.0*  --   HGB 12.1* 12.3*  --  11.4* 10.6*  HCT 35.2* 36.8* 34.1* 35.9* 33.8*  MCV 93.1 98.7  --  99.2 100.9*  PLT 4* 2*  --  22* 9*   Basic Metabolic Panel: Recent Labs  Lab 05/23/18 1230 05/24/18 1402  05/25/18 0339  NA 138 138 133*  K 4.5 4.1 4.2  CL 106 107 105  CO2 22 21* 20*  GLUCOSE 104* 105* 175*  BUN 35* 34* 37*  CREATININE 1.96* 2.10* 2.09*  CALCIUM 8.7 9.1 9.2   GFR: Estimated Creatinine Clearance: 24.2 mL/min (A) (by C-G formula based on SCr of 2.09 mg/dL (H)). Liver Function Tests: Recent Labs  Lab 05/23/18 1230 05/24/18 1402 05/25/18 0339    AST 28 30 34  ALT 20 23 24   ALKPHOS 214* 180* 169*  BILITOT 0.5 0.8 0.6  PROT 7.6 8.1 9.3*  ALBUMIN 3.8 3.6 3.5   No results for input(s): LIPASE, AMYLASE in the last 168 hours. No results for input(s): AMMONIA in the last 168 hours. Coagulation Profile: Recent Labs  Lab 05/23/18 1229 05/24/18 1402 05/25/18 0339  INR 1.1* 1.1 1.2   Cardiac Enzymes: No results for input(s): CKTOTAL, CKMB, CKMBINDEX, TROPONINI in the last 168 hours. BNP (last 3 results) No results for input(s): PROBNP in the last 8760 hours. HbA1C: No results for input(s): HGBA1C in the last 72 hours. CBG: No results for input(s): GLUCAP in the last 168 hours. Lipid Profile: No results for input(s): CHOL, HDL, LDLCALC, TRIG, CHOLHDL, LDLDIRECT in the last 72 hours. Thyroid Function Tests: No results for input(s): TSH, T4TOTAL, FREET4, T3FREE, THYROIDAB in the last 72 hours. Anemia Panel: Recent Labs    05/24/18 1402  VITAMINB12 399   Sepsis Labs: No results for input(s): PROCALCITON, LATICACIDVEN in the last 168 hours.  No results found for this or any previous visit (from the past 240 hour(s)).       Radiology Studies: No results found.      Scheduled Meds: . sodium chloride   Intravenous Once  . amLODipine  5 mg Oral Daily  . brimonidine  1 drop Both Eyes BID  . dexamethasone  20 mg Oral Daily  . folic acid  1 mg Oral Daily  . Immune Globulin 10%  1 g/kg Intravenous Q24 Hr x 2  . tamsulosin  0.4 mg Oral Daily   Continuous Infusions:   LOS: 1 day     Cordelia Poche, MD [facility] 05/25/2018, 2:42 PM  If 7PM-7AM, please contact night-coverage [email or web address]

## 2018-05-25 NOTE — Progress Notes (Addendum)
[facility] INPATIENT PROGRESS NOTE  SUBJECTIVE: Daniel [name] 85 y.o. 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  The patient received 1 unit of platelets on 05/24/2018.  Post platelet transfusion his platelet count increased to 22,000.  He received IVIG 1 g/kg overnight.  This morning his platelet count is down to 9000.  He has been started on dexamethasone 20 mg daily.  The patient reports that he is feeling well this morning.  He has not had any overt bleeding.  Still with multiple bruises and petechiae on his lower extremities.  He has been afebrile.  Denies chest pain, shortness of breath, cough.  Denies nausea, vomiting, constipation, diarrhea.  Denies headaches.  ALLERGIES:  is allergic to ciprofloxacin; robaxin [methocarbamol]; and iron.  MEDICATIONS:  Current Facility-Administered Medications  Medication Dose Route Frequency Provider Last Rate Last Dose  . 0.9 %  sodium chloride infusion (Manually program via Guardrails IV Fluids)   Intravenous Once Narda Bonds, MD      . acetaminophen (TYLENOL) tablet 650 mg  650 mg Oral Q6H PRN Swayze, Ava, DO       Or  . acetaminophen (TYLENOL) suppository 650 mg  650 mg Rectal Q6H PRN Swayze, Ava, DO      . acetaminophen (TYLENOL) tablet 1,000 mg  1,000 mg Oral Once PRN Johney Maine, MD   1,000 mg at 05/24/18 2309  . amLODipine (NORVASC) tablet 5 mg  5 mg Oral Daily Swayze, Ava, DO   5 mg at 05/24/18 1849  . brimonidine (ALPHAGAN) 0.2 % ophthalmic solution 1 drop  1 drop Both Eyes BID Swayze, Ava, DO   1 drop at 05/24/18 2210  . dexamethasone (DECADRON) tablet 20 mg  20 mg Oral Daily Johney Maine, MD   20 mg at 05/24/18 1849  . diphenhydrAMINE (BENADRYL) capsule 25 mg  25 mg Oral Once PRN Johney Maine, MD   25 mg at 05/24/18 2310  . folic acid (FOLVITE) tablet 1 mg  1 mg Oral Daily Swayze, Ava, DO   1 mg at 05/24/18 1849  . Immune Globulin 10% (PRIVIGEN) IV infusion 65 g  1 g/kg Intravenous Q24 Hr  x 2 Johney Maine, MD   Stopped at 05/25/18 0320  . ondansetron (ZOFRAN) tablet 4 mg  4 mg Oral Q6H PRN Swayze, Ava, DO       Or  . ondansetron (ZOFRAN) injection 4 mg  4 mg Intravenous Q6H PRN Swayze, Ava, DO      . ondansetron (ZOFRAN) tablet 4 mg  4 mg Oral Once PRN Johney Maine, MD   4 mg at 05/24/18 2310  . tamsulosin (FLOMAX) capsule 0.4 mg  0.4 mg Oral Daily Swayze, Ava, DO      . traZODone (DESYREL) tablet 25 mg  25 mg Oral QHS PRN Swayze, Ava, DO        REVIEW OF SYSTEMS:   Review of Systems  Constitutional: Negative for chills, fever and malaise/fatigue.  HENT: Negative for congestion, sinus pain and sore throat.        Denies recurrent nosebleeds since admission.  Eyes: Negative.   Respiratory: Negative.   Cardiovascular: Negative.   Gastrointestinal: Negative.   Genitourinary: Negative.   Musculoskeletal: Negative.   Skin: Negative for itching and rash.       Needs to have multiple ecchymotic areas and petechiae.  Neurological: Negative for dizziness, seizures, weakness and headaches.  Endo/Heme/Allergies:       Bruises easily.  Psychiatric/Behavioral: Negative.       PHYSICAL EXAMINATION:  Vital signs in last 24 hours: Temp:  [97.3 F (36.3 C)-98.4 F (36.9 C)] 97.8 F (36.6 C) (03/05 0442) Pulse Rate:  [65-106] 74 (03/05 0442) Resp:  [15-20] 16 (03/05 0442) BP: (96-162)/(55-91) 127/61 (03/05 0442) SpO2:  [95 %-99 %] 96 % (03/05 0442) Weight:  [141 lb 7.9 oz (64.2 kg)] 141 lb 7.9 oz (64.2 kg) (03/04 1859) Weight change:  Last BM Date: 05/24/18   Intake/Output from previous day: 03/04 0701 - 03/05 0700 In: 1804 [P.O.:595; I.V.:1000; Blood:209] Out: 1025 [Urine:1025] General: Alert, awake without distress. Head: Normocephalic atraumatic. Mouth: mucous membranes moist, pharynx normal without lesions.  Ecchymosis noted to the left side of his face and mouth. Eyes: No scleral icterus.  Pupils are equal and round reactive to light. Resp: clear  to auscultation bilaterally without rhonchi or wheezes or dullness to percussion. Cardio: regular rate and rhythm, S1, S2 normal, no murmur, click, rub or gallop GI: soft, non-tender; bowel sounds normal; no masses,  no organomegaly Musculoskeletal: No joint deformity or effusion. Neurological: No motor, sensory deficits.  Intact deep tendon reflexes. Skin: No rashes or lesions.  Multiple bruises noted to his arms, legs, and face.  AK I noted on his bilateral lower extremities.  LABORATORY DATA: Lab Results  Component Value Date   WBC 6.7 05/25/2018   HGB 10.6 (L) 05/25/2018   HCT 33.8 (L) 05/25/2018   MCV 100.9 (H) 05/25/2018   PLT 9 (LL) 05/25/2018    CMP Latest Ref Rng & Units 05/25/2018 05/24/2018 05/23/2018  Glucose 70 - 99 mg/dL 037(C) 488(Q) 916(X)  BUN 8 - 23 mg/dL 45(W) 38(U) 82(C)  Creatinine 0.61 - 1.24 mg/dL 0.03(K) 9.17(H) 1.50(V)  Sodium 135 - 145 mmol/L 133(L) 138 138  Potassium 3.5 - 5.1 mmol/L 4.2 4.1 4.5  Chloride 98 - 111 mmol/L 105 107 106  CO2 22 - 32 mmol/L 20(L) 21(L) 22  Calcium 8.9 - 10.3 mg/dL 9.2 9.1 8.7  Total Protein 6.5 - 8.1 g/dL 9.3(H) 8.1 7.6  Total Bilirubin 0.3 - 1.2 mg/dL 0.6 0.8 0.5  Alkaline Phos 38 - 126 U/L 169(H) 180(H) 214(H)  AST 15 - 41 U/L 34 30 28  ALT 0 - 44 U/L 24 23 20      RADIOGRAPHIC STUDIES:  No results found.   ASSESSMENT/PLAN:  This is an 85 year old male with:  1.  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  Prior work-up did not show any evidence of a myeloproliferative disorder.  LDH was normal.  He has an increased immature platelet fraction.  Peripheral smear was reviewed yesterday by Dr. Candise Che which showed no evidence of platelet clumping.  An absence of platelets without overt hemolysis on the peripheral blood smear.  Platelet count this morning is 9000.  He is not actively bleeding.  Agree with platelet transfusion as already ordered by his primary team.  Continue dexamethasone 20 mg daily.  Due for an additional dose of IVIG  later today.  Continue to monitor CBC at least twice a day.  Continue to hold Coumadin until platelet counts are at least stably above 50,000.  2.  Anemia: Likely due to renal insufficiency and may be in part dilutional.  Continue to monitor.  Consider transfusion if his hemoglobin is 8.0 or less.  3.  Leukocytosis: He has had longstanding leukocytosis dating back to at least 2010.  Prior work-up for CML was negative.  White blood cell count has normalized this morning at 6.7.  Continue to monitor.   LOS: 1 day   Clenton Pare, DNP, AGPCNP-BC, AOCNP 05/25/18   ADDENDUM  .Patient was Personally and independently interviewed, examined and relevant elements of the history of present illness were reviewed in details and an assessment and plan was created. All elements of the patient's history of present illness , assessment and plan were discussed in details with Clenton Pare NP. The above documentation reflects our combined findings assessment and plan.  Platelet counts are improved to 67k this evening. Leucocytosis from steroids. Patient notes no evidence of bleeding. Tolerated 1st dose of IVIG without any issues. . CBC    Component Value Date/Time   WBC 19.4 (H) 05/25/2018 1651   RBC 3.39 (L) 05/25/2018 1651   HGB 10.7 (L) 05/25/2018 1651   HGB 12.6 (L) 10/12/2017 0733   HCT 33.8 (L) 05/25/2018 1651   HCT 34.1 (L) 05/24/2018 1419   PLT 67 (L) 05/25/2018 1651   PLT 254 10/12/2017 0733   MCV 99.7 05/25/2018 1651   MCH 31.6 05/25/2018 1651   MCHC 31.7 05/25/2018 1651   RDW 14.5 05/25/2018 1651   LYMPHSABS 1.1 05/25/2018 1651   MONOABS 0.2 05/25/2018 1651   EOSABS 0.0 05/25/2018 1651   BASOSABS 0.0 05/25/2018 1651   PLAN -complete dexamethasone  daily x 4 doses as ordered. -will get 2nd planned dose of IVIG this evening with tylenol, benadryl and zofran for pre medications. -will continue to follow -watch for evidence of fluid overload and give lasix if  needed. -transfuse platelets prn for PLT<10k or if bleeding. plz recheck plt counts 30-60 mins after each platelet transfusion to evaluate for refractoriness.  Wyvonnia Lora MD MS

## 2018-05-26 ENCOUNTER — Other Ambulatory Visit: Payer: Self-pay | Admitting: *Deleted

## 2018-05-26 DIAGNOSIS — D693 Immune thrombocytopenic purpura: Secondary | ICD-10-CM

## 2018-05-26 LAB — CBC
HCT: 33.8 % — ABNORMAL LOW (ref 39.0–52.0)
HCT: 30.9 % — ABNORMAL LOW (ref 39.0–52.0)
HCT: 34.3 % — ABNORMAL LOW (ref 39.0–52.0)
HEMOGLOBIN: 10.6 g/dL — AB (ref 13.0–17.0)
Hemoglobin: 9.9 g/dL — ABNORMAL LOW (ref 13.0–17.0)
Hemoglobin: 10.8 g/dL — ABNORMAL LOW (ref 13.0–17.0)
MCH: 31.6 pg (ref 26.0–34.0)
MCH: 31.7 pg (ref 26.0–34.0)
MCH: 31.4 pg (ref 26.0–34.0)
MCHC: 31.4 g/dL (ref 30.0–36.0)
MCHC: 32 g/dL (ref 30.0–36.0)
MCHC: 31.5 g/dL (ref 30.0–36.0)
MCV: 100.9 fL — ABNORMAL HIGH (ref 80.0–100.0)
MCV: 99 fL (ref 80.0–100.0)
MCV: 99.7 fL (ref 80.0–100.0)
PLATELETS: 73 10*3/uL — AB (ref 150–400)
Platelets: 9 10*3/uL — CL (ref 150–400)
Platelets: 104 10*3/uL — ABNORMAL LOW (ref 150–400)
RBC: 3.35 MIL/uL — ABNORMAL LOW (ref 4.22–5.81)
RBC: 3.12 MIL/uL — ABNORMAL LOW (ref 4.22–5.81)
RBC: 3.44 MIL/uL — ABNORMAL LOW (ref 4.22–5.81)
RDW: 14.6 % (ref 11.5–15.5)
RDW: 14.6 % (ref 11.5–15.5)
RDW: 14.7 % (ref 11.5–15.5)
WBC: 6.7 10*3/uL (ref 4.0–10.5)
WBC: 19.8 10*3/uL — ABNORMAL HIGH (ref 4.0–10.5)
WBC: 21.5 10*3/uL — ABNORMAL HIGH (ref 4.0–10.5)
nRBC: 0 % (ref 0.0–0.2)
nRBC: 0 % (ref 0.0–0.2)
nRBC: 0 % (ref 0.0–0.2)

## 2018-05-26 LAB — BPAM PLATELET PHERESIS
Blood Product Expiration Date: 202003072359
ISSUE DATE / TIME: 202003051153
Unit Type and Rh: 5100

## 2018-05-26 LAB — PREPARE PLATELET PHERESIS: Unit division: 0

## 2018-05-26 MED ORDER — CHLORPROMAZINE HCL 25 MG PO TABS
25.0000 mg | ORAL_TABLET | Freq: Once | ORAL | Status: DC
  Filled 2018-05-26: qty 1

## 2018-05-26 NOTE — Progress Notes (Addendum)
[facility] INPATIENT PROGRESS NOTE  SUBJECTIVE: Daniel [name] 85 y.o. 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  The patient received 1 unit of platelets on 05/24/2018 and on 05/25/2018.  He received IVIG 1 g/kg X2 doses.  This morning his platelet count 73,000.  Remains on dexamethsone 20 mg daily. The patient reports that he is feeling well this morning.  He has not had any overt bleeding.  Still with multiple bruises and petechiae on his lower extremities.  He has been afebrile.  Denies chest pain, shortness of breath, cough.  Denies nausea, vomiting, constipation, diarrhea.  Denies headaches.  ALLERGIES:  is allergic to ciprofloxacin; robaxin [methocarbamol]; and iron.  MEDICATIONS:  Current Facility-Administered Medications  Medication Dose Route Frequency Provider Last Rate Last Dose  . 0.9 %  sodium chloride infusion (Manually program via Guardrails IV Fluids)   Intravenous Once Narda Bonds, MD      . acetaminophen (TYLENOL) tablet 650 mg  650 mg Oral Q6H PRN Swayze, Ava, DO       Or  . acetaminophen (TYLENOL) suppository 650 mg  650 mg Rectal Q6H PRN Swayze, Ava, DO      . amLODipine (NORVASC) tablet 5 mg  5 mg Oral Daily Swayze, Ava, DO   5 mg at 05/25/18 1714  . atorvastatin (LIPITOR) tablet 20 mg  20 mg Oral q1800 Narda Bonds, MD   20 mg at 05/25/18 1714  . brimonidine (ALPHAGAN) 0.2 % ophthalmic solution 1 drop  1 drop Both Eyes BID Swayze, Ava, DO   1 drop at 05/25/18 2230  . chlorproMAZINE (THORAZINE) tablet 25 mg  25 mg Oral Once Schorr, Roma Kayser, NP   Stopped at 05/26/18 0230  . dexamethasone (DECADRON) tablet 20 mg  20 mg Oral Daily Johney Maine, MD   20 mg at 05/25/18 1009  . folic acid (FOLVITE) tablet 1 mg  1 mg Oral Daily Swayze, Ava, DO   1 mg at 05/25/18 1714  . ondansetron (ZOFRAN) tablet 4 mg  4 mg Oral Q6H PRN Swayze, Ava, DO       Or  . ondansetron (ZOFRAN) injection 4 mg  4 mg Intravenous Q6H PRN Swayze, Ava, DO      .  ondansetron (ZOFRAN) tablet 4 mg  4 mg Oral Once PRN Johney Maine, MD   4 mg at 05/24/18 2310  . tamsulosin (FLOMAX) capsule 0.4 mg  0.4 mg Oral Daily Swayze, Ava, DO   0.4 mg at 05/25/18 1009  . traZODone (DESYREL) tablet 25 mg  25 mg Oral QHS PRN Swayze, Ava, DO        REVIEW OF SYSTEMS:   Review of Systems  Constitutional: Negative for chills, fever and malaise/fatigue.  HENT: Negative for congestion, sinus pain and sore throat.        Denies recurrent nosebleeds since admission.  Eyes: Negative.   Respiratory: Negative.   Cardiovascular: Negative.   Gastrointestinal: Negative.   Genitourinary: Negative.   Musculoskeletal: Negative.   Skin: Negative for itching and rash.       Needs to have multiple ecchymotic areas and petechiae.  Neurological: Negative for dizziness, seizures, weakness and headaches.  Endo/Heme/Allergies:       Bruises easily.  Psychiatric/Behavioral: Negative.       PHYSICAL EXAMINATION:  Vital signs in last 24 hours: Temp:  [96.7 F (35.9 C)-98 F (36.7 C)] 97.3 F (36.3 C) (03/06 0444) Pulse Rate:  [60-95] 72 (03/06 0444) Resp:  [  14-16] 14 (03/06 0444) BP: (116-134)/(59-86) 134/71 (03/06 0444) SpO2:  [91 %-98 %] 97 % (03/06 0444) Weight change:  Last BM Date: 05/25/18   Intake/Output from previous day: 03/05 0701 - 03/06 0700 In: 707.9 [P.O.:460; Blood:247.9] Out: 475 [Urine:475] General: Alert, awake without distress. Head: Normocephalic atraumatic. Mouth: mucous membranes moist, pharynx normal without lesions.  Ecchymosis noted to the left side of his face and mouth. Eyes: No scleral icterus.  Pupils are equal and round reactive to light. Resp: clear to auscultation bilaterally without rhonchi or wheezes or dullness to percussion. Cardio: regular rate and rhythm, S1, S2 normal, no murmur, click, rub or gallop GI: soft, non-tender; bowel sounds normal; no masses,  no organomegaly Musculoskeletal: No joint deformity or  effusion. Neurological: No motor, sensory deficits.  Intact deep tendon reflexes. Skin: No rashes or lesions.  Multiple bruises noted to his arms, legs, and face.  AK I noted on his bilateral lower extremities.  LABORATORY DATA: Lab Results  Component Value Date   WBC 19.8 (H) 05/26/2018   HGB 9.9 (L) 05/26/2018   HCT 30.9 (L) 05/26/2018   MCV 99.0 05/26/2018   PLT 73 (L) 05/26/2018    CMP Latest Ref Rng & Units 05/25/2018 05/24/2018 05/23/2018  Glucose 70 - 99 mg/dL 782(N) 562(Z) 308(M)  BUN 8 - 23 mg/dL 57(Q) 46(N) 62(X)  Creatinine 0.61 - 1.24 mg/dL 5.28(U) 1.32(G) 4.01(U)  Sodium 135 - 145 mmol/L 133(L) 138 138  Potassium 3.5 - 5.1 mmol/L 4.2 4.1 4.5  Chloride 98 - 111 mmol/L 105 107 106  CO2 22 - 32 mmol/L 20(L) 21(L) 22  Calcium 8.9 - 10.3 mg/dL 9.2 9.1 8.7  Total Protein 6.5 - 8.1 g/dL 9.3(H) 8.1 7.6  Total Bilirubin 0.3 - 1.2 mg/dL 0.6 0.8 0.5  Alkaline Phos 38 - 126 U/L 169(H) 180(H) 214(H)  AST 15 - 41 U/L 34 30 28  ALT 0 - 44 U/L 24 23 20      RADIOGRAPHIC STUDIES:  No results found.   ASSESSMENT/PLAN:  This is an 85 year old male with:  1.  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  Prior work-up did not show any evidence of a myeloproliferative disorder.  LDH was normal.  He has an increased immature platelet fraction.  Peripheral smear Previously reviewed by Dr. Candise Che which showed no evidence of platelet clumping.  An absence of platelets without overt hemolysis on the peripheral blood smear.  Platelet count this morning has improved to 73,000.  He is not actively bleeding.  He has received 2 units of platelets and 2 doses of IVIF 1g/kg during this admission. Continue dexamethasone 20 mg daily. Coumadin can be resumed once platelet counts are at least stably above 50,000.  2.  Anemia: Likely due to renal insufficiency and may be in part dilutional.  Continue to monitor.  Consider transfusion if his hemoglobin is 8.0 or less.  3.  Leukocytosis: He has had longstanding  leukocytosis dating back to at least 2010. Elevated in part due to steroids. Prior work-up for CML was negative. Continue to monitor.     LOS: 2 days   Clenton Pare, DNP, AGPCNP-BC, AOCNP 05/26/18   ADDENDUM  .Patient was Personally and independently interviewed, examined and relevant elements of the history of present illness were reviewed in details and an assessment and plan was created. All elements of the patient's history of present illness , assessment and plan were discussed in details with Clenton Pare. The above documentation reflects our combined findings assessment and plan.  Patient's platelets have continued to improve and are up to 73k today after 2 doses of IVIG. He is in good spirits today.  No evidence of overt bleeding.  Healing purpura on bilateral upper and lower extremities and on his upper lip. No evidence of hematuria hematochezia or melena.  No other evidence of bleeding. He is in good spirits and has no other acute symptoms. I discussed, with his consent, his diagnosis and clinical course with his daughter on the phone by his bedside. Plan -No indication for platelet transfusions at this time. -Patient has completed his 2 doses of IVIG no other additional IVIG at this time. -Complete 4 doses of dexamethasone 20 mg p.o. daily.  Would discharge on prednisone 50 mg daily with 10 mg taper per week. -We will set the patient up for outpatient follow-up in about a week with labs at the cancer clinic. -Patient may discharge home tomorrow morning if platelets are stably above 50,000 without transfusions. -Please call if any additional questions. -If platelets are stable for a week might be able to gradually restart Coumadin with a risk versus benefit discussion.  Wyvonnia Lora MD MS

## 2018-05-26 NOTE — Progress Notes (Addendum)
PROGRESS NOTE    Daniel [name]  VEL:[id number] DOB: [birth year] DOA: 05/24/2018 PCP: Marin Olp, MD   Brief Narrative: Daniel [name] is a 85 y.o. male with a history of GI Bleed remotely, PAF, Gout, Glaucoma, hyperlipidemia, hypertension, Right upper lobe pulmonary nodule remotely, CKD, stroke. He presented secondary to an abnormal lab of 4,000 platelets concerning for ITP.   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 Recommend 1 unit of platelets for platelet count < 10k. Started on IVIG and decadron. Several bruises, no current bleeding. Good response to treatment so far.  -Heme/onc recommendations: Continue IVIG/decadron/transfusions as needed  Essential hypertension -Continue amlodipine  Atrial fibrillation Patient is on Coumadin as an outpatient. Not on rate control medications. Currently, rate is controlled. Platelet count above 50 K but will wait until appears stable prior to restarting Coumadin per hematology recommendations -Hold Coumadin  CKD stage III Baseline creatinine of about 2. Stable.  Anemia Chronic. Mild. Hemoglobin down to 10.6 today. Possibly dilutional. Iron panel from about 1 year ago -CBC in AM  Hyperlipidemia -Continue Lipitor  Gout Asymptomatic currently. On allopurinol as an outpatient.   Leukocytosis Chronically elevated of unknown etiology. Patient has been worked up as an outpatient, including bone marrow biopsy. Worsened in setting of steroid therapy.   DVT prophylaxis: SCDs Code Status:   Code Status: Full Code Family Communication: None at bedside Disposition Plan: Home pending hematology recommendations/management   Consultants:   Hematology/oncology  Procedures:    None  Antimicrobials:  None    Subjective: No concerns today other than wanting clean clothes.   Objective: Vitals:   05/25/18 2354 05/26/18 0235 05/26/18 0444 05/26/18 1428  BP: 116/62 118/67 134/71 122/60  Pulse: 85 80 72 73  Resp:  14 14 16   Temp: 98 F (36.7 C) 98 F (36.7 C) (!) 97.3 F (36.3 C) 97.8 F (36.6 C)  TempSrc: Oral Oral Oral Oral  SpO2: 91% 93% 97% 94%  Weight:      Height:        Intake/Output Summary (Last 24 hours) at 05/26/2018 1456 Last data filed at 05/26/2018 2585 Gross per 24 hour  Intake 120 ml  Output 475 ml  Net -355 ml   Filed Weights   05/24/18 1859  Weight: 64.2 kg    Examination:  General exam: Appears calm and comfortable Respiratory system: Clear to auscultation. Respiratory effort normal. Cardiovascular system: S1 & S2 heard, RRR. No murmurs, rubs, gallops or clicks. Gastrointestinal system: Abdomen is nondistended, soft and nontender. No organomegaly or masses felt. Normal bowel sounds heard. Central nervous system: Alert and oriented. No focal neurological deficits. Extremities: No edema. No calf tenderness Skin: No cyanosis. Multiple areas of ecchymosis of arms and legs with petechiae of legs Psychiatry: Judgement and insight appear normal. Mood & affect appropriate.     Data Reviewed: I have personally reviewed following labs and imaging studies  CBC: Recent Labs  Lab 05/23/18 1229 05/24/18 1402 05/24/18 1419 05/24/18 1823 05/25/18 0339 05/25/18 1651 05/26/18 0437  WBC 13.9* 12.3*  --  14.4* 6.7 19.4* 19.8*  NEUTROABS 9,410*  --   --  9.0*  --  17.8*  --   HGB 12.1* 12.3*  --  11.4* 10.6* 10.7* 9.9*  HCT 35.2* 36.8* 34.1* 35.9* 33.8* 33.8* 30.9*  MCV 93.1 98.7  --  99.2 100.9* 99.7 99.0  PLT 4* 2*  --  22* 9* 67* 73*   Basic Metabolic Panel: Recent Labs  Lab 05/23/18 1230 05/24/18 1402 05/25/18 0339  NA 138 138 133*  K 4.5 4.1 4.2  CL 106 107 105  CO2 22 21* 20*  GLUCOSE 104* 105* 175*  BUN 35*  34* 37*  CREATININE 1.96* 2.10* 2.09*  CALCIUM 8.7 9.1 9.2   GFR: Estimated Creatinine Clearance: 24.2 mL/min (A) (by C-G formula based on SCr of 2.09 mg/dL (H)). Liver Function Tests: Recent Labs  Lab 05/23/18 1230 05/24/18 1402 05/25/18 0339  AST 28 30 34  ALT 20 23 24   ALKPHOS 214* 180* 169*  BILITOT 0.5 0.8 0.6  PROT 7.6 8.1 9.3*  ALBUMIN 3.8 3.6 3.5   No results for input(s): LIPASE, AMYLASE in the last 168 hours. No results for input(s): AMMONIA in the last 168 hours. Coagulation Profile: Recent Labs  Lab 05/23/18 1229 05/24/18 1402 05/25/18 0339  INR 1.1* 1.1 1.2   Cardiac Enzymes: No results for input(s): CKTOTAL, CKMB, CKMBINDEX, TROPONINI in the last 168 hours. BNP (last 3 results) No results for input(s): PROBNP in the last 8760 hours. HbA1C: No results for input(s): HGBA1C in the last 72 hours. CBG: No results for input(s): GLUCAP in the last 168 hours. Lipid Profile: No results for input(s): CHOL, HDL, LDLCALC, TRIG, CHOLHDL, LDLDIRECT in the last 72 hours. Thyroid Function Tests: No results for input(s): TSH, T4TOTAL, FREET4, T3FREE, THYROIDAB in the last 72 hours. Anemia Panel: Recent Labs    05/24/18 1402  VITAMINB12 399   Sepsis Labs: No results for input(s): PROCALCITON, LATICACIDVEN in the last 168 hours.  No results found for this or any previous visit (from the past 240 hour(s)).       Radiology Studies: No results found.      Scheduled Meds: . sodium chloride   Intravenous Once  . amLODipine  5 mg Oral Daily  . atorvastatin  20 mg Oral q1800  . brimonidine  1 drop Both Eyes BID  . chlorproMAZINE  25 mg Oral Once  . dexamethasone  20 mg Oral Daily  . folic acid  1 mg Oral Daily  . tamsulosin  0.4 mg Oral Daily   Continuous Infusions:   LOS: 2 days     Cordelia Poche, MD [facility] 05/26/2018, 2:56 PM  If 7PM-7AM, please contact night-coverage [email or web address]

## 2018-05-26 NOTE — Care Management Important Message (Signed)
Important Message  Patient Details  Name: Daniel [name] MRN: [id number] Date of Birth: 1932-05-05   Medicare Important Message Given:  Yes    [name] 05/26/2018, 8:56 AM

## 2018-05-27 LAB — CBC
HCT: 34.4 % — ABNORMAL LOW (ref 39.0–52.0)
Hemoglobin: 10.8 g/dL — ABNORMAL LOW (ref 13.0–17.0)
MCH: 31.3 pg (ref 26.0–34.0)
MCHC: 31.4 g/dL (ref 30.0–36.0)
MCV: 99.7 fL (ref 80.0–100.0)
NRBC: 0 % (ref 0.0–0.2)
Platelets: 138 10*3/uL — ABNORMAL LOW (ref 150–400)
RBC: 3.45 MIL/uL — ABNORMAL LOW (ref 4.22–5.81)
RDW: 15.1 % (ref 11.5–15.5)
WBC: 18.8 10*3/uL — ABNORMAL HIGH (ref 4.0–10.5)

## 2018-05-27 LAB — SAMPLE TO BLOOD BANK

## 2018-05-27 MED ORDER — PREDNISONE 10 MG PO TABS: ORAL_TABLET | ORAL | 0 refills | Status: AC

## 2018-05-27 NOTE — Discharge Instructions (Signed)
Daniel [name],  You were admitted to the hospital because of low platelet count.  This caused you to bleed a lot.  You are given therapies including IVIG, platelet transfusion, steroids with good results.  Be hematologist saw you and has prescribed a steroid taper for you.  Please follow-up with your primary care physician as your Coumadin has been held until your platelets remained stable.  Please also follow-up with the hematologist.

## 2018-05-27 NOTE — Discharge Summary (Signed)
Physician Discharge Summary  Daniel [name] WIO:[id number] DOB: [birth year] DOA: 05/24/2018  PCP: Marin Olp, MD  Admit date: 05/24/2018 Discharge date: 05/27/2018  Admitted From: Home Disposition: Home  Recommendations for Outpatient Follow-up:  1. Follow up with PCP in 1 week 2. Follow up with Hematology 3. Discuss when to restart Coumadin; per hematology, okay if platelets continue to remain stable 4. Please obtain BMP/CBC in one week 5. Please follow up on the following pending results: None  Home Health: None Equipment/Devices: None  Discharge Condition: Stable CODE STATUS: Full code Diet recommendation: Heart healthy   Brief/Interim Summary:  Admission HPI written by Karie Kirks, DO   Chief Complaint: Extensive ecchymoses all over his body. Platelets of 4K by PCP office labs. HPI: The patient is a 85 yr old man who presented to his PCP yesterday with complaints of bleeding gums, red spots on his legs and multiple bruises all over his body including his lips. CBC was obtained and the patient was found to have 4K platelets. He was called and told to come to the ED. In the ED he was found to have 2K platelets. The patient denies rectal bleeding. He has had epistaxis.  The patient has a past medical history significant for GI Bleed remotely, PAF, Gout, Glaucoma, hyperlipidemia, hypertension, Right upper lobe pulmonary nodule remotely, CKD, stroke. Futhermore last July the patient underwent a bone marrow biopsy to evaluate for his leukocytosis. No cause was found at that time. The patient denies fevers, chills, pruritis, or pain. He denies cough, shortness of breath, chest pain, nausea or vomiting. No hematemesis, melena or hematochezia. He denies significant recent weight loss. He does states that his appetite has been waning. He does think that he has lost around 15 lbs over 2 years.   The patient arrived to the [facility] with temperature of 97.7, respiratory rate of 17, pulse  of 106, blood pressure was 96/55. The patient was not symptomatic of his low blood pressure. He received 1 pheresis pack of platelets in the ED. He was saturating 97 on room air.   Sodium ws 138, potassium was 4.1. CO2 was 21. Creatinine was 2.1. This appears to be his baseline creatinine.LDH was 183. Vit B12 was 399. WBC was 12.3. Hemoglobin was 12.3. Platelets were 2K. Peripheral smear is pending. Fibrinogen was 539. INR was 1.1. PTT was 34.     Hospital course:  ITP Acute. Initial platelet count of 2 k. Patient required 2 units of platelets. Hematology consulted and patient received two IVIG infusions (on dates 3/4 and 3/5). He was also started on oral decadron. Platelets rebounded nicely. Outpatient follow-up with hematology in addition to PCP. Platelets of 138 k on discharge.  Essential hypertension Continue amlodipine  Atrial fibrillation Patient is on Coumadin as on outpatient. Not on rate control medications. Currently, rate is controlled. CHA2DS2-VASc Score is 3. Coumadin recommended to be held on discharge per hematology. Recommend continued evaluation for restarting Coumadin as an outpatient.  CKD stage III Baseline creatinine of about 2. Stable.  Anemia Chronic. Mild. Unknown etiology. Hemoglobin down to a low of 9.9. Possibly dilutional. Stable at just under 11. Iron panel from about 1 year ago unremarkable.  Hyperlipidemia Continue Lipitor  Gout Asymptomatic currently. On allopurinol as an outpatient. Continue.  Leukocytosis Chronically elevated of unknown etiology. Patient has been worked up as an outpatient, including bone marrow biopsy. Worsened in setting of steroid therapy. Outpatient follow-up.  Discharge Diagnoses:  Principal Problem:   Acute ITP (  HCC) Active Problems:   Dyslipidemia   Essential hypertension   Atrial fibrillation (HCC)   CKD (chronic kidney disease), stage III (HCC)   Bleeding   Long term (current) use of anticoagulants    Leukocytosis   Thrombocytopenia (HCC)    Discharge Instructions  Discharge Instructions    Increase activity slowly   Complete by:  As directed      Allergies as of 05/27/2018      Reactions   Ciprofloxacin Other (See Comments)   Tendon pain - knee bilateral   Robaxin [methocarbamol] Swelling   Iron Rash      Medication List    STOP taking these medications   warfarin 2.5 MG tablet Commonly known as:  COUMADIN     TAKE these medications   allopurinol 300 MG tablet Commonly known as:  ZYLOPRIM TAKE ONE TABLET BY MOUTH DAILY   amLODipine 5 MG tablet Commonly known as:  NORVASC TAKE ONE TABLET BY MOUTH DAILY   atorvastatin 20 MG tablet Commonly known as:  LIPITOR TAKE ONE TABLET BY MOUTH DAILY AT 6 PM (REPLACES SIMVASTATIN) What changed:  See the new instructions.   brimonidine 0.2 % ophthalmic solution Commonly known as:  ALPHAGAN Place 1 drop into both eyes 2 (two) times daily.   folic acid 1 MG tablet Commonly known as:  FOLVITE Take 1 tablet (1 mg total) by mouth daily. What changed:  when to take this   predniSONE 10 MG tablet Commonly known as:  DELTASONE Take 5 tablets (50 mg total) by mouth daily with breakfast for 5 days, THEN 4 tablets (40 mg total) daily with breakfast for 5 days, THEN 3 tablets (30 mg total) daily with breakfast for 7 days, THEN 2 tablets (20 mg total) daily with breakfast for 10 days, THEN 1 tablet (10 mg total) daily with breakfast for 10 days, THEN 0.5 tablets (5 mg total) daily with breakfast for 10 days. Start taking on:  May 27, 2018   tamsulosin 0.4 MG Caps capsule Commonly known as:  FLOMAX TAKE ONE CAPSULE BY MOUTH DAILY AFTER BREAKFAST What changed:  See the new instructions.   Voltaren 1 % Gel Generic drug:  diclofenac sodium Apply 1 application topically daily as needed (for knee pain).      Follow-up Information    Hunter, Stephen O, MD. Schedule an appointment as soon as possible for a visit in 1 week(s).     Specialty:  Family Medicine Why:  Hospital follow-up and Coumadin follow-up Contact information: [address] [place] [address] [phone]        Kale, Gautam Kishore, MD. Schedule an appointment as soon as possible for a visit.   Specialties:  Hematology, Oncology Why:  Thrombocytopenia Contact information: [address] [place] [address] [phone]          Allergies  Allergen Reactions  . Ciprofloxacin Other (See Comments)    Tendon pain - knee bilateral  . Robaxin [Methocarbamol] Swelling  . Iron Rash    Consultations:  Hematology   Procedures/Studies:  No results found.   Subjective: Feels great today. No bleeding.  Discharge Exam: Vitals:   05/26/18 2119 05/27/18 0503  BP: 124/64 118/72  Pulse: 95 76  Resp: 20 16  Temp: 98 F (36.7 C) (!) 97.4 F (36.3 C)  SpO2: 96% 98%   Vitals:   05/26/18 0444 05/26/18 1428 05/26/18 2119 05/27/18 0503  BP: 134/71 122/60 124/64 118/72  Pulse: 72 73 95 76  Resp: 14   _0 Temp: (!) 97.3 F (36.3 C) 97.8 F (36.6 C) 98 F (36.7 C) (!) 97.4 F (36.3 C)  TempSrc: Oral Oral Oral Oral  SpO2: 97% 94% 96% 98%  Weight:      Height:        General: Pt is alert, awake, not in acute distress. Thin. Cardiovascular: RRR, S1/S2 +, no rubs, no gallops Respiratory: CTA bilaterally, no wheezing, no rhonchi Abdominal: Soft, NT, ND, bowel sounds + Extremities: multiple areas of ecchymosis on arms and legs. Petechiae present on legs bilaterally    The results of significant diagnostics from this hospitalization (including imaging, microbiology, ancillary and laboratory) are listed below for reference.     Microbiology: No results found for this or any previous visit (from the past 240 hour(s)).   Labs: BNP (last 3 results) No results for input(s): BNP in the last 8760 hours. Basic Metabolic Panel: Recent Labs  Lab 05/23/18 1230 05/24/18 1402 05/25/18 0339  NA 138 138  133*  K 4.5 4.1 4.2  CL 106 107 105  CO2 22 21* 20*  GLUCOSE 104* 105* 175*  BUN 35* 34* 37*  CREATININE 1.96* 2.10* 2.09*  CALCIUM 8.7 9.1 9.2   Liver Function Tests: Recent Labs  Lab 05/23/18 1230 05/24/18 1402 05/25/18 0339  AST 28 30 34  ALT _1 ALKPHOS 214* 180* 169*  BILITOT 0.5 0.8 0.6  PROT 7.6 8.1 9.3*  ALBUMIN 3.8 3.6 3.5   No results for input(s): LIPASE, AMYLASE in the last 168 hours. No results for input(s): AMMONIA in the last 168 hours. CBC: Recent Labs  Lab 05/23/18 1229  05/24/18 1823 05/25/18 0339 05/25/18 1651 05/26/18 0437 05/26/18 1638 05/27/18 0446  WBC 13.9*   < > 14.4* 6.7 19.4* 19.8* 21.5* 18.8*  NEUTROABS 9,410*  --  9.0*  --  17.8*  --   --   --   HGB 12.1*   < > 11.4* 10.6* 10.7* 9.9* 10.8* 10.8*  HCT 35.2*   < > 35.9* 33.8* 33.8* 30.9* 34.3* 34.4*  MCV 93.1   < > 99.2 100.9* 99.7 99.0 99.7 99.7  PLT 4*   < > 22* 9* 67* 73* 104* 138*   < > = values in this interval not displayed.   Cardiac Enzymes: No results for input(s): CKTOTAL, CKMB, CKMBINDEX, TROPONINI in the last 168 hours. BNP: Invalid input(s): POCBNP CBG: No results for input(s): GLUCAP in the last 168 hours. D-Dimer No results for input(s): DDIMER in the last 72 hours. Hgb A1c No results for input(s): HGBA1C in the last 72 hours. Lipid Profile No results for input(s): CHOL, HDL, LDLCALC, TRIG, CHOLHDL, LDLDIRECT in the last 72 hours. Thyroid function studies No results for input(s): TSH, T4TOTAL, T3FREE, THYROIDAB in the last 72 hours.  Invalid input(s): FREET3 Anemia work up Recent Labs    05/24/18 1402  VITAMINB12 399   Urinalysis    Component Value Date/Time   COLORURINE YELLOW 11/20/2017 [place] 11/20/2017 1547   LABSPEC 1.014 11/20/2017 1547   PHURINE 5.0 11/20/2017 1547   GLUCOSEU NEGATIVE 11/20/2017 1547   HGBUR SMALL (A) 11/20/2017 1547   BILIRUBINUR NEGATIVE 11/20/2017 1547   BILIRUBINUR Negative 07/06/2017 1223    KETONESUR NEGATIVE 11/20/2017 1547   PROTEINUR 100 (A) 11/20/2017 1547   UROBILINOGEN 0.2 07/06/2017 1223   UROBILINOGEN 0.2 03/28/2014 2004   NITRITE NEGATIVE 11/20/2017 1547   LEUKOCYTESUR NEGATIVE 11/20/2017 1547    SIGNED:   Deidre Ala  , MD [facility] 05/27/2018, 12:36 PM   
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
## 2018-05-27 NOTE — Progress Notes (Signed)
Reviewed discharge paperwork, medication regimen, and reminded patient to pick up prescription with printed Rx. Patient escorted out via wheelchair by NT.

## 2018-05-29 ENCOUNTER — Telehealth: Payer: Self-pay | Admitting: Hematology

## 2018-05-29 NOTE — Telephone Encounter (Signed)
Scheduled appt per 3/6 sch message - pt is aware of appt date and time
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
## 2018-05-30 ENCOUNTER — Encounter: Payer: Self-pay | Admitting: Hematology

## 2018-05-30 ENCOUNTER — Other Ambulatory Visit: Payer: Self-pay

## 2018-05-30 ENCOUNTER — Inpatient Hospital Stay: Payer: PPO | Attending: Hematology | Admitting: Hematology

## 2018-05-30 ENCOUNTER — Inpatient Hospital Stay: Payer: PPO

## 2018-05-30 ENCOUNTER — Telehealth: Payer: Self-pay | Admitting: Hematology

## 2018-05-30 ENCOUNTER — Telehealth: Payer: Self-pay

## 2018-05-30 VITALS — BP 120/76 | HR 74 | Temp 97.7°F | Resp 18 | Ht 66.0 in | Wt 145.7 lb

## 2018-05-30 DIAGNOSIS — E785 Hyperlipidemia, unspecified: Secondary | ICD-10-CM | POA: Insufficient documentation

## 2018-05-30 DIAGNOSIS — R918 Other nonspecific abnormal finding of lung field: Secondary | ICD-10-CM | POA: Diagnosis not present

## 2018-05-30 DIAGNOSIS — D693 Immune thrombocytopenic purpura: Secondary | ICD-10-CM

## 2018-05-30 DIAGNOSIS — Z86010 Personal history of colonic polyps: Secondary | ICD-10-CM | POA: Insufficient documentation

## 2018-05-30 DIAGNOSIS — Z8673 Personal history of transient ischemic attack (TIA), and cerebral infarction without residual deficits: Secondary | ICD-10-CM | POA: Insufficient documentation

## 2018-05-30 DIAGNOSIS — Z79899 Other long term (current) drug therapy: Secondary | ICD-10-CM | POA: Insufficient documentation

## 2018-05-30 DIAGNOSIS — D649 Anemia, unspecified: Secondary | ICD-10-CM | POA: Insufficient documentation

## 2018-05-30 DIAGNOSIS — Z87891 Personal history of nicotine dependence: Secondary | ICD-10-CM | POA: Insufficient documentation

## 2018-05-30 DIAGNOSIS — M129 Arthropathy, unspecified: Secondary | ICD-10-CM | POA: Diagnosis not present

## 2018-05-30 DIAGNOSIS — N2889 Other specified disorders of kidney and ureter: Secondary | ICD-10-CM

## 2018-05-30 DIAGNOSIS — I10 Essential (primary) hypertension: Secondary | ICD-10-CM | POA: Diagnosis not present

## 2018-05-30 LAB — CBC WITH DIFFERENTIAL (CANCER CENTER ONLY)
ABS IMMATURE GRANULOCYTES: 0.36 10*3/uL — AB (ref 0.00–0.07)
Basophils Absolute: 0 10*3/uL (ref 0.0–0.1)
Basophils Relative: 0 %
Eosinophils Absolute: 0 10*3/uL (ref 0.0–0.5)
Eosinophils Relative: 0 %
HCT: 32.8 % — ABNORMAL LOW (ref 39.0–52.0)
Hemoglobin: 10.8 g/dL — ABNORMAL LOW (ref 13.0–17.0)
Immature Granulocytes: 2 %
Lymphocytes Relative: 12 %
Lymphs Abs: 2.7 10*3/uL (ref 0.7–4.0)
MCH: 31.5 pg (ref 26.0–34.0)
MCHC: 32.9 g/dL (ref 30.0–36.0)
MCV: 95.6 fL (ref 80.0–100.0)
Monocytes Absolute: 1.7 10*3/uL — ABNORMAL HIGH (ref 0.1–1.0)
Monocytes Relative: 8 %
NRBC: 0 % (ref 0.0–0.2)
Neutro Abs: 17.5 10*3/uL — ABNORMAL HIGH (ref 1.7–7.7)
Neutrophils Relative %: 78 %
Platelet Count: 356 10*3/uL (ref 150–400)
RBC: 3.43 MIL/uL — ABNORMAL LOW (ref 4.22–5.81)
RDW: 15.4 % (ref 11.5–15.5)
WBC Count: 22.3 10*3/uL — ABNORMAL HIGH (ref 4.0–10.5)

## 2018-05-30 LAB — CMP (CANCER CENTER ONLY)
ALBUMIN: 3 g/dL — AB (ref 3.5–5.0)
ALT: 41 U/L (ref 0–44)
AST: 32 U/L (ref 15–41)
Alkaline Phosphatase: 152 U/L — ABNORMAL HIGH (ref 38–126)
Anion gap: 8 (ref 5–15)
BUN: 45 mg/dL — ABNORMAL HIGH (ref 8–23)
CHLORIDE: 106 mmol/L (ref 98–111)
CO2: 21 mmol/L — AB (ref 22–32)
Calcium: 8 mg/dL — ABNORMAL LOW (ref 8.9–10.3)
Creatinine: 1.92 mg/dL — ABNORMAL HIGH (ref 0.61–1.24)
GFR, Est AFR Am: 36 mL/min — ABNORMAL LOW (ref >60)
GFR, Estimated: 31 mL/min — ABNORMAL LOW (ref >60)
Glucose, Bld: 131 mg/dL — ABNORMAL HIGH (ref 70–99)
Potassium: 3.7 mmol/L (ref 3.5–5.1)
Sodium: 135 mmol/L (ref 135–145)
Total Bilirubin: 0.6 mg/dL (ref 0.3–1.2)
Total Protein: 8.6 g/dL — ABNORMAL HIGH (ref 6.5–8.1)

## 2018-05-30 LAB — IMMATURE PLATELET FRACTION: Immature Platelet Fraction: 6.8 % (ref 1.2–8.6)

## 2018-05-30 NOTE — Telephone Encounter (Signed)
THanks [place]- I do not see him on the schedule at all though- can you please confirm that you scheduled it?

## 2018-05-30 NOTE — Telephone Encounter (Signed)
Per Chart Review    Admit date: 05/24/2018 Discharge date: 05/27/2018  Admitted From: Home Disposition: Home  Recommendations for Outpatient Follow-up:  1. Follow up with PCP in 1 week 2. Follow up with Hematology 3. Discuss when to restart Coumadin; per hematology, okay if platelets continue to remain stable 4. Please obtain BMP/CBC in one week 5. Please follow up on the following pending results: None  Home Health: None Equipment/Devices: None  Discharge Condition: Stable CODE STATUS: Full code Diet recommendation: Heart healthy  ___________________________________  Per Telephone Call  Transition Care Management Follow-up Telephone Call   Date discharged? 05/27/18  How have you been since you were released from the hospital? Patient stated he has been fatigued,and having pain on left side abdomen.    Do you understand why you were in the hospital? Yes   Do you understand the discharge instructions? Yes   Where were you discharged to? Home   Items Reviewed:  Medications reviewed: Yes  Allergies reviewed: Yes  Dietary changes reviewed: Yes  Referrals reviewed: Yes   Functional Questionnaire:   Activities of Daily Living (ADLs):   He states they are independent in the following: ambulation, bathing and hygiene, feeding, continence, grooming, toileting and dressing States they require assistance with the following: None   Any transportation issues/concerns?: NO     Any patient concerns? No   Confirmed importance and date/time of follow-up visits scheduled Yes  Provider Appointment booked with Dr.Hunter on 06/05/18.  Confirmed with patient if condition begins to worsen call PCP or go to the ER.  Patient was given the office number and encouraged to call back with question or concerns.  : yes

## 2018-05-30 NOTE — Patient Instructions (Signed)
Thank you for choosing [facility] to provide your oncology and hematology care.  To afford each patient quality time with our providers, please arrive 30 minutes before your scheduled appointment time.  If you arrive late for your appointment, you may be asked to reschedule.  We strive to give you quality time with our providers, and arriving late affects you and other patients whose appointments are after yours.    If you are a no show for multiple scheduled visits, you may be dismissed from the clinic at the providers discretion.     Again, thank you for choosing [facility], our hope is that these requests will decrease the amount of time that you wait before being seen by our physicians.  ______________________________________________________________________   Should you have questions after your visit to the  Cancer Center, please contact our office at [phone] between the hours of 8:30 and 4:30 p.m.    Voicemails left after 4:30p.m will not be returned until the following business day.     For prescription refill requests, please have your pharmacy contact us directly.  Please also try to allow 48 hours for prescription requests.     Please contact the scheduling department for questions regarding scheduling.  For scheduling of procedures such as PET scans, CT scans, MRI, Ultrasound, etc please contact central scheduling at [phone].     Resources For Cancer Patients and Caregivers:    Oncolink.org:  A wonderful resource for patients and healthcare providers for information regarding your disease, ways to tract your treatment, what to expect, etc.      American Cancer Society:  800-227-2345  Can help patients locate various types of support and financial assistance   Cancer Care: 1-800-813-HOPE (4673) Provides financial assistance, online support groups, medication/co-pay assistance.     [place] DSS:  336-641-3447 Where to apply  for food stamps, Medicaid, and utility assistance   Medicare Rights Center: 800-333-4114 Helps people with Medicare understand their rights and benefits, navigate the Medicare system, and secure the quality healthcare they deserve   SCAT: 336-333-6589 [place] [employer]'s shared-ride transportation service for eligible riders who have a disability that prevents them from riding the fixed route bus.     For additional information on assistance programs please contact our social worker:   Abigail Elmore:  [phone]  

## 2018-05-30 NOTE — Telephone Encounter (Signed)
Scheduled appt per 3/10 los. ° °Printed calendar and avs. °

## 2018-05-31 NOTE — Telephone Encounter (Signed)
This encounter was created in error - please disregard.

## 2018-06-01 NOTE — Telephone Encounter (Signed)
Pt has been scheduled for 06/05/2018. No further action need at this time.

## 2018-06-01 NOTE — Telephone Encounter (Signed)
Noted thanks °

## 2018-06-05 ENCOUNTER — Encounter: Payer: Self-pay | Admitting: Family Medicine

## 2018-06-05 ENCOUNTER — Ambulatory Visit (INDEPENDENT_AMBULATORY_CARE_PROVIDER_SITE_OTHER): Payer: PPO | Admitting: Family Medicine

## 2018-06-05 ENCOUNTER — Other Ambulatory Visit: Payer: Self-pay

## 2018-06-05 VITALS — BP 122/60 | HR 66 | Temp 97.8°F | Ht 66.0 in | Wt 141.0 lb

## 2018-06-05 DIAGNOSIS — D693 Immune thrombocytopenic purpura: Secondary | ICD-10-CM

## 2018-06-05 DIAGNOSIS — M109 Gout, unspecified: Secondary | ICD-10-CM | POA: Diagnosis not present

## 2018-06-05 DIAGNOSIS — N183 Chronic kidney disease, stage 3 unspecified: Secondary | ICD-10-CM

## 2018-06-05 DIAGNOSIS — I4819 Other persistent atrial fibrillation: Secondary | ICD-10-CM

## 2018-06-05 DIAGNOSIS — I10 Essential (primary) hypertension: Secondary | ICD-10-CM

## 2018-06-05 DIAGNOSIS — E785 Hyperlipidemia, unspecified: Secondary | ICD-10-CM

## 2018-06-05 NOTE — Progress Notes (Signed)
Phone [phone]   Subjective:  Daniel [name] is a 85 y.o. year old very pleasant male patient who presents for transitional care management and hospital follow up for ITP. Patient was hospitalized from May 24, 2018 to May 27, 2018. A TCM phone call was completed on May 30, 2018. Medical complexity moderate   Patient presented to our office last week with extensive bruising, bleeding gums and bruised lips- patient with history of remote GI bleed, atrial fibrillation, gout, glaucoma, hypertension, hyperlipidemia, CKD, stroke.  Patient with some known CBC abnormalities-had bone marrow biopsy to evaluate for leukocytosis last July 2019.  No cause was found at that time.  Due to extreme thrombocytopenia-he was referred to the emergency room.  Patient denied any rectal bleeding.  He was having active epistaxis.   Vital signs were stable other than slightly low blood pressure upon arrival to the emergency room.  Patient was not symptomatic of low blood pressure fortunately.  He received 1 three 6 pack of platelets in the emergency room.  LDH was mildly elevated.  For acute ITP-   Patient required 2 units of platelets eventually.  Hematology consulted and patient received 2 IVIG infusions on the fourth and the fifth.  He was started on oral Decadron.  Platelets responded to these measures and were 138 at discharge.  Hypertension-patient was continued on amlodipine as blood pressure trended back up  Atrial fibrillation-patient with chads Vasc score of 5 (age, htn, stoke history)- corresponding to 7.2% risk of stroke per year.  Hematology recommended Coumadin be held upon discharge. -INR goal 2-2.5 if restarted  - He is firm today that he wants to remain off coumadin despite 7.2% yearly risk for stroke.   CKD stage III-creatinine was around 2 in the hospital.  Anemia- chronic and mild with unknown etiology.  Did trend down during hospitalization but may have been  dilutional iron panel was  unremarkable  Hyperlipidemia-maintained on Lipitor  Gout- patient was continued on allopurinol as an outpatient.  He was asymptomatic  Leukocytosis- elevated of unknown etiology.  Has been worked up on her biopsy in the past.  Worsened with steroid therapy.  We will continue hematology follow-up.  Patient has already had hematology follow-up on outpatient basis on the 10th-platelets have normalized thankfully.  Hemoglobin was trending up.  They said that he could restart Coumadin in consultation with [place] but patient had low interest in restarting.  He was discharged on prednisone 50 mg with 10 mg taper per week.  Plan was to recheck labs again in 2 weeks.  He reports no new bruising and bleeding. He feels great overall. He still has some light bloody noses but he can stop it.    See problem oriented charting as well ROS- No chest pain or shortness of breath. No headache or blurry vision.    Past Medical History-  Patient Active Problem List   Diagnosis Date Noted  . Atrial fibrillation ([place]) 11/04/2006    Priority: High  . Leukocytosis 08/04/2017    Priority: Medium  . BPH (benign prostatic hyperplasia) 10/31/2013    Priority: Medium  . Dyslipidemia 08/24/2006    Priority: Medium  . GOUT 08/24/2006    Priority: Medium  . Essential hypertension 08/24/2006    Priority: Medium  . CKD (chronic kidney disease), stage III ([place]) 08/24/2006    Priority: Medium  . Caregiver burden 04/06/2017    Priority: Low  . Long term (current) use of anticoagulants 12/01/2016    Priority: Low  .  Encounter for therapeutic drug monitoring 07/05/2013    Priority: Low  . PULMONARY NODULE, RIGHT UPPER LOBE 11/24/2007    Priority: Low  . Thrombocytopenia ([place]) 05/24/2018  . Acute ITP ([place])   . Syncope 11/20/2017  . Bleeding 03/29/2014    Medications- reviewed and updated  A medical reconciliation was performed comparing current medicines to hospital discharge medications. Current Outpatient  Medications  Medication Sig Dispense Refill  . allopurinol (ZYLOPRIM) 300 MG tablet TAKE ONE TABLET BY MOUTH DAILY (Patient taking differently: Take 300 mg by mouth daily. ) 90 tablet 0  . amLODipine (NORVASC) 5 MG tablet TAKE ONE TABLET BY MOUTH DAILY (Patient taking differently: Take 5 mg by mouth daily. ) 90 tablet 2  . atorvastatin (LIPITOR) 20 MG tablet TAKE ONE TABLET BY MOUTH DAILY AT 6 PM (REPLACES SIMVASTATIN) (Patient taking differently: Take 20 mg by mouth daily at 6 PM. ) 90 tablet 2  . brimonidine (ALPHAGAN) 0.2 % ophthalmic solution Place 1 drop into both eyes 2 (two) times daily.     . folic acid (FOLVITE) 1 MG tablet Take 1 tablet (1 mg total) by mouth daily. (Patient taking differently: Take 1 mg by mouth every evening. ) 30 tablet 2  . predniSONE (DELTASONE) 10 MG tablet Take 5 tablets (50 mg total) by mouth daily with breakfast for 5 days, THEN 4 tablets (40 mg total) daily with breakfast for 5 days, THEN 3 tablets (30 mg total) daily with breakfast for 7 days, THEN 2 tablets (20 mg total) daily with breakfast for 10 days, THEN 1 tablet (10 mg total) daily with breakfast for 10 days, THEN 0.5 tablets (5 mg total) daily with breakfast for 10 days. 100 tablet 0  . tamsulosin (FLOMAX) 0.4 MG CAPS capsule TAKE ONE CAPSULE BY MOUTH DAILY AFTER BREAKFAST (Patient taking differently: Take 0.4 mg by mouth daily. ) 90 capsule 1  . VOLTAREN 1 % GEL Apply 1 application topically daily as needed (for knee pain).      No current facility-administered medications for this visit.    Objective  Objective:  BP 122/60 (BP Location: Left Arm, Patient Position: Sitting, Cuff Size: Normal)   Pulse 66   Temp 97.8 F (36.6 C) (Oral)   Ht _0  (1.676 m)   Wt 141 lb (64 kg)   SpO2 98%   BMI 22.76 kg/m  Gen: NAD, resting comfortably No bruising or bleeding on the lips or gums.  No active bleeding from the nose. CV: RRR no murmurs rubs or gallops Lungs: CTAB no crackles, wheeze, rhonchi Abdomen:  soft/nontender/nondistended/normal bowel sounds. Ext: no edema Skin: warm, dry Neuro: grossly normal, moves all extremities, speech normal, walks without cane today MSK: Walks without walker- he jumped up and down to show me that he is without pain-I did not request this    Assessment and Plan:   #ITP-required 2 units of platelets.  Now status post 2 IVIG infusions.  Patient is on prednisone taper.  Platelets have responded nicely-continue hematology follow-up and has repeat labs in about 2 weeks.  #Atrial fibrillation- despite elevated chads vasc score of 5-patient is firmly against restarting Coumadin.  He is aware of a 7.2% stroke risk per year.  We will re-counsel next visit-if he changes his mind would use INR goal of 2-2.5.  #Hypertension-well controlled on amlodipine  #CKD stage III- creatinine was around 2 in the hospital and remained stable in the outpatient setting with check at hematology within the week  #Anemia/leukocytosis-chronic and stable.  Continue hematology follow-up-had bone marrow biopsy previously and they have not suggested repeat  #Hyperlipidemia-continue Lipitor  #Gout- has remained asymptomatic.  Continue allopurinol.  #Arthritis- patient is very encouraged that he has had very little pain since getting out of the hospital.  He was walking without a cane and literally jumping in the office today. Discussed prednisone may contribute to him walking without pain  Future Appointments  Date Time Provider [place]  06/14/2018  1:30 PM [facility] 2 [facility] None  06/14/2018  2:00 PM Brunetta Genera, MD [facility] None  06/19/2018  1:15 PM Trula Slade, DPM [facility]  12/04/2018 11:20 AM Yong Channel Brayton Mars, MD [facility]  With current coronavirus issues- he wanted to do 5-monthfollow-up  Lab/Order associations: Idiopathic thrombocytopenic purpura (ITP) ([place]  Other persistent atrial fibrillation  CKD (chronic kidney  disease), stage III ([place]  Essential hypertension  Dyslipidemia  Gout, unspecified cause, unspecified chronicity, unspecified site  Return precautions advised.  SGarret Reddish MD

## 2018-06-05 NOTE — Patient Instructions (Addendum)
Make sure to get follow up labs with hematology as planned  You have opted to remain off of coumadin. We discussed there is a 7.2% risk per year of stroke but you prefer to remain off medicine due to concern of bleeding at present. We can discuss again at future visit.

## 2018-06-05 NOTE — Assessment & Plan Note (Signed)
#  ITP-required 2 units of platelets after platelets got as low as 2000.  Now status post 2 IVIG infusions.  Patient is on prednisone taper.  Platelets have responded nicely-continue hematology follow-up and has repeat labs in about 2 weeks.

## 2018-06-05 NOTE — Assessment & Plan Note (Signed)
#  Atrial fibrillation- despite elevated chads vasc score of 5-patient is firmly against restarting Coumadin.  He is aware of a 7.2% stroke risk per year.  We will re-counsel next visit-if he changes his mind would use INR goal of 2-2.5.
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
## 2018-06-14 ENCOUNTER — Inpatient Hospital Stay: Payer: PPO

## 2018-06-14 ENCOUNTER — Telehealth: Payer: Self-pay | Admitting: Hematology

## 2018-06-14 ENCOUNTER — Inpatient Hospital Stay (HOSPITAL_BASED_OUTPATIENT_CLINIC_OR_DEPARTMENT_OTHER): Payer: PPO | Admitting: Hematology

## 2018-06-14 ENCOUNTER — Other Ambulatory Visit: Payer: Self-pay

## 2018-06-14 VITALS — BP 153/72 | HR 92 | Temp 97.9°F | Resp 8 | Ht 66.0 in | Wt 145.9 lb

## 2018-06-14 DIAGNOSIS — Z87891 Personal history of nicotine dependence: Secondary | ICD-10-CM | POA: Diagnosis not present

## 2018-06-14 DIAGNOSIS — Z86010 Personal history of colonic polyps: Secondary | ICD-10-CM | POA: Diagnosis not present

## 2018-06-14 DIAGNOSIS — E785 Hyperlipidemia, unspecified: Secondary | ICD-10-CM | POA: Diagnosis not present

## 2018-06-14 DIAGNOSIS — R918 Other nonspecific abnormal finding of lung field: Secondary | ICD-10-CM

## 2018-06-14 DIAGNOSIS — Z8673 Personal history of transient ischemic attack (TIA), and cerebral infarction without residual deficits: Secondary | ICD-10-CM

## 2018-06-14 DIAGNOSIS — Z79899 Other long term (current) drug therapy: Secondary | ICD-10-CM

## 2018-06-14 DIAGNOSIS — D693 Immune thrombocytopenic purpura: Secondary | ICD-10-CM | POA: Diagnosis not present

## 2018-06-14 DIAGNOSIS — D649 Anemia, unspecified: Secondary | ICD-10-CM

## 2018-06-14 DIAGNOSIS — N2889 Other specified disorders of kidney and ureter: Secondary | ICD-10-CM | POA: Diagnosis not present

## 2018-06-14 DIAGNOSIS — I10 Essential (primary) hypertension: Secondary | ICD-10-CM

## 2018-06-14 DIAGNOSIS — M129 Arthropathy, unspecified: Secondary | ICD-10-CM

## 2018-06-14 LAB — CMP (CANCER CENTER ONLY)
ALT: 41 U/L (ref 0–44)
ANION GAP: 7 (ref 5–15)
AST: 27 U/L (ref 15–41)
Albumin: 2.8 g/dL — ABNORMAL LOW (ref 3.5–5.0)
Alkaline Phosphatase: 128 U/L — ABNORMAL HIGH (ref 38–126)
BUN: 30 mg/dL — ABNORMAL HIGH (ref 8–23)
CO2: 25 mmol/L (ref 22–32)
Calcium: 8 mg/dL — ABNORMAL LOW (ref 8.9–10.3)
Chloride: 107 mmol/L (ref 98–111)
Creatinine: 1.67 mg/dL — ABNORMAL HIGH (ref 0.61–1.24)
GFR, EST AFRICAN AMERICAN: 43 mL/min — AB (ref >60)
GFR, Estimated: 37 mL/min — ABNORMAL LOW (ref >60)
Glucose, Bld: 156 mg/dL — ABNORMAL HIGH (ref 70–99)
Potassium: 4 mmol/L (ref 3.5–5.1)
Sodium: 139 mmol/L (ref 135–145)
Total Bilirubin: 0.7 mg/dL (ref 0.3–1.2)
Total Protein: 6.7 g/dL (ref 6.5–8.1)

## 2018-06-14 LAB — CBC WITH DIFFERENTIAL/PLATELET
Abs Immature Granulocytes: 0.09 10*3/uL — ABNORMAL HIGH (ref 0.00–0.07)
BASOS ABS: 0 10*3/uL (ref 0.0–0.1)
Basophils Relative: 0 %
Eosinophils Absolute: 0 10*3/uL (ref 0.0–0.5)
Eosinophils Relative: 0 %
HCT: 34.7 % — ABNORMAL LOW (ref 39.0–52.0)
Hemoglobin: 11.5 g/dL — ABNORMAL LOW (ref 13.0–17.0)
IMMATURE GRANULOCYTES: 1 %
Lymphocytes Relative: 5 %
Lymphs Abs: 0.7 10*3/uL (ref 0.7–4.0)
MCH: 32.7 pg (ref 26.0–34.0)
MCHC: 33.1 g/dL (ref 30.0–36.0)
MCV: 98.6 fL (ref 80.0–100.0)
Monocytes Absolute: 0.6 10*3/uL (ref 0.1–1.0)
Monocytes Relative: 4 %
NEUTROS ABS: 13.7 10*3/uL — AB (ref 1.7–7.7)
NEUTROS PCT: 90 %
PLATELETS: 100 10*3/uL — AB (ref 150–400)
RBC: 3.52 MIL/uL — ABNORMAL LOW (ref 4.22–5.81)
RDW: 17.1 % — ABNORMAL HIGH (ref 11.5–15.5)
WBC: 15.1 10*3/uL — ABNORMAL HIGH (ref 4.0–10.5)
nRBC: 0 % (ref 0.0–0.2)

## 2018-06-14 NOTE — Telephone Encounter (Signed)
Gave avs and calendar ° °

## 2018-06-19 ENCOUNTER — Ambulatory Visit: Payer: PPO | Admitting: Podiatry

## 2018-06-20 ENCOUNTER — Other Ambulatory Visit: Payer: Self-pay

## 2018-06-20 ENCOUNTER — Encounter: Payer: Self-pay | Admitting: Podiatry

## 2018-06-20 ENCOUNTER — Ambulatory Visit (INDEPENDENT_AMBULATORY_CARE_PROVIDER_SITE_OTHER): Payer: PPO | Admitting: Podiatry

## 2018-06-20 VITALS — Temp 97.3°F

## 2018-06-20 DIAGNOSIS — M79676 Pain in unspecified toe(s): Secondary | ICD-10-CM | POA: Diagnosis not present

## 2018-06-20 DIAGNOSIS — D689 Coagulation defect, unspecified: Secondary | ICD-10-CM

## 2018-06-20 DIAGNOSIS — B351 Tinea unguium: Secondary | ICD-10-CM

## 2018-06-20 NOTE — Progress Notes (Signed)
Subjective: 85 y.o. returns the office today for painful, elongated, thickened toenails which he cannot trim himself. Denies any redness or drainage around the nails. Denies any acute changes to his lower extremities since last appointment and no new complaints today. Denies any systemic complaints such as fevers, chills, nausea, vomiting.   He has been in the hospital for ITP  He is on coumadin.   Objective: AAO 3, NAD DP/PT pulses palpable, CRT less than 3 seconds Nails hypertrophic, dystrophic, elongated, brittle, discolored 10. All of the nails appear to be discolored and thickened. No open lesions or pre-ulcerative lesions are identified. No pain with calf compression, swelling, warmth, erythema. Overall, doing well with no changes   Assessment: Patient presents with symptomatic onychomycosis  Plan: -Treatment options including alternatives, risks, complications were discussed -Nails sharply debrided 10 without complication/bleeding. -Discussed daily foot inspection. If there are any changes, to call the office immediately.  -Follow-up in 3 months or sooner if any problems are to arise. In the meantime, encouraged to call the office with any questions, concerns, changes symptoms.  Ovid Curd, DPM

## 2018-06-21 ENCOUNTER — Ambulatory Visit: Payer: PPO

## 2018-06-23 ENCOUNTER — Other Ambulatory Visit: Payer: Self-pay | Admitting: Family Medicine

## 2018-06-28 ENCOUNTER — Other Ambulatory Visit: Payer: Self-pay

## 2018-06-28 ENCOUNTER — Inpatient Hospital Stay: Payer: PPO | Attending: Hematology

## 2018-06-28 DIAGNOSIS — E785 Hyperlipidemia, unspecified: Secondary | ICD-10-CM | POA: Insufficient documentation

## 2018-06-28 DIAGNOSIS — I10 Essential (primary) hypertension: Secondary | ICD-10-CM | POA: Insufficient documentation

## 2018-06-28 DIAGNOSIS — Z7901 Long term (current) use of anticoagulants: Secondary | ICD-10-CM | POA: Insufficient documentation

## 2018-06-28 DIAGNOSIS — Z79899 Other long term (current) drug therapy: Secondary | ICD-10-CM | POA: Insufficient documentation

## 2018-06-28 DIAGNOSIS — R911 Solitary pulmonary nodule: Secondary | ICD-10-CM | POA: Diagnosis not present

## 2018-06-28 DIAGNOSIS — D649 Anemia, unspecified: Secondary | ICD-10-CM | POA: Diagnosis not present

## 2018-06-28 DIAGNOSIS — M129 Arthropathy, unspecified: Secondary | ICD-10-CM | POA: Insufficient documentation

## 2018-06-28 DIAGNOSIS — Z8711 Personal history of peptic ulcer disease: Secondary | ICD-10-CM | POA: Insufficient documentation

## 2018-06-28 DIAGNOSIS — D693 Immune thrombocytopenic purpura: Secondary | ICD-10-CM | POA: Insufficient documentation

## 2018-06-28 DIAGNOSIS — Z8673 Personal history of transient ischemic attack (TIA), and cerebral infarction without residual deficits: Secondary | ICD-10-CM | POA: Insufficient documentation

## 2018-06-28 DIAGNOSIS — Z87891 Personal history of nicotine dependence: Secondary | ICD-10-CM | POA: Insufficient documentation

## 2018-06-28 LAB — CBC WITH DIFFERENTIAL/PLATELET
Abs Immature Granulocytes: 0.1 10*3/uL — ABNORMAL HIGH (ref 0.00–0.07)
Basophils Absolute: 0 10*3/uL (ref 0.0–0.1)
Basophils Relative: 0 %
Eosinophils Absolute: 0 10*3/uL (ref 0.0–0.5)
Eosinophils Relative: 0 %
HCT: 37.8 % — ABNORMAL LOW (ref 39.0–52.0)
Hemoglobin: 12.3 g/dL — ABNORMAL LOW (ref 13.0–17.0)
Immature Granulocytes: 1 %
Lymphocytes Relative: 6 %
Lymphs Abs: 0.9 10*3/uL (ref 0.7–4.0)
MCH: 32.4 pg (ref 26.0–34.0)
MCHC: 32.5 g/dL (ref 30.0–36.0)
MCV: 99.5 fL (ref 80.0–100.0)
Monocytes Absolute: 0.3 10*3/uL (ref 0.1–1.0)
Monocytes Relative: 2 %
Neutro Abs: 12 10*3/uL — ABNORMAL HIGH (ref 1.7–7.7)
Neutrophils Relative %: 91 %
Platelets: 260 10*3/uL (ref 150–400)
RBC: 3.8 MIL/uL — ABNORMAL LOW (ref 4.22–5.81)
RDW: 16.6 % — ABNORMAL HIGH (ref 11.5–15.5)
WBC: 13.3 10*3/uL — ABNORMAL HIGH (ref 4.0–10.5)
nRBC: 0 % (ref 0.0–0.2)

## 2018-07-07 ENCOUNTER — Ambulatory Visit: Payer: Self-pay | Admitting: General Practice
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
## 2018-07-12 ENCOUNTER — Other Ambulatory Visit: Payer: Self-pay

## 2018-07-12 ENCOUNTER — Telehealth: Payer: Self-pay | Admitting: Hematology

## 2018-07-12 ENCOUNTER — Inpatient Hospital Stay: Payer: PPO

## 2018-07-12 ENCOUNTER — Inpatient Hospital Stay (HOSPITAL_BASED_OUTPATIENT_CLINIC_OR_DEPARTMENT_OTHER): Payer: PPO | Admitting: Hematology

## 2018-07-12 VITALS — BP 135/74 | HR 96 | Temp 97.9°F | Resp 18 | Ht 66.0 in | Wt 145.7 lb

## 2018-07-12 DIAGNOSIS — R233 Spontaneous ecchymoses: Secondary | ICD-10-CM

## 2018-07-12 DIAGNOSIS — Z7901 Long term (current) use of anticoagulants: Secondary | ICD-10-CM

## 2018-07-12 DIAGNOSIS — R911 Solitary pulmonary nodule: Secondary | ICD-10-CM | POA: Diagnosis not present

## 2018-07-12 DIAGNOSIS — Z8673 Personal history of transient ischemic attack (TIA), and cerebral infarction without residual deficits: Secondary | ICD-10-CM | POA: Diagnosis not present

## 2018-07-12 DIAGNOSIS — D649 Anemia, unspecified: Secondary | ICD-10-CM

## 2018-07-12 DIAGNOSIS — I10 Essential (primary) hypertension: Secondary | ICD-10-CM

## 2018-07-12 DIAGNOSIS — Z87891 Personal history of nicotine dependence: Secondary | ICD-10-CM

## 2018-07-12 DIAGNOSIS — Z8711 Personal history of peptic ulcer disease: Secondary | ICD-10-CM

## 2018-07-12 DIAGNOSIS — E785 Hyperlipidemia, unspecified: Secondary | ICD-10-CM

## 2018-07-12 DIAGNOSIS — M129 Arthropathy, unspecified: Secondary | ICD-10-CM | POA: Diagnosis not present

## 2018-07-12 DIAGNOSIS — D693 Immune thrombocytopenic purpura: Secondary | ICD-10-CM

## 2018-07-12 DIAGNOSIS — Z79899 Other long term (current) drug therapy: Secondary | ICD-10-CM | POA: Diagnosis not present

## 2018-07-12 LAB — CMP (CANCER CENTER ONLY)
ALT: 19 U/L (ref 0–44)
AST: 19 U/L (ref 15–41)
Albumin: 2.7 g/dL — ABNORMAL LOW (ref 3.5–5.0)
Alkaline Phosphatase: 127 U/L — ABNORMAL HIGH (ref 38–126)
Anion gap: 8 (ref 5–15)
BUN: 21 mg/dL (ref 8–23)
CO2: 28 mmol/L (ref 22–32)
Calcium: 9.1 mg/dL (ref 8.9–10.3)
Chloride: 104 mmol/L (ref 98–111)
Creatinine: 2.01 mg/dL — ABNORMAL HIGH (ref 0.61–1.24)
GFR, Est AFR Am: 35 mL/min — ABNORMAL LOW (ref >60)
GFR, Estimated: 30 mL/min — ABNORMAL LOW (ref >60)
Glucose, Bld: 119 mg/dL — ABNORMAL HIGH (ref 70–99)
Potassium: 4.6 mmol/L (ref 3.5–5.1)
Sodium: 140 mmol/L (ref 135–145)
Total Bilirubin: 0.3 mg/dL (ref 0.3–1.2)
Total Protein: 7.6 g/dL (ref 6.5–8.1)

## 2018-07-12 LAB — CBC WITH DIFFERENTIAL/PLATELET
Abs Immature Granulocytes: 0.03 10*3/uL (ref 0.00–0.07)
Basophils Absolute: 0.1 10*3/uL (ref 0.0–0.1)
Basophils Relative: 1 %
Eosinophils Absolute: 0.2 10*3/uL (ref 0.0–0.5)
Eosinophils Relative: 2 %
HCT: 37.2 % — ABNORMAL LOW (ref 39.0–52.0)
Hemoglobin: 12.3 g/dL — ABNORMAL LOW (ref 13.0–17.0)
Immature Granulocytes: 0 %
Lymphocytes Relative: 21 %
Lymphs Abs: 2.1 10*3/uL (ref 0.7–4.0)
MCH: 32.2 pg (ref 26.0–34.0)
MCHC: 33.1 g/dL (ref 30.0–36.0)
MCV: 97.4 fL (ref 80.0–100.0)
Monocytes Absolute: 0.9 10*3/uL (ref 0.1–1.0)
Monocytes Relative: 9 %
Neutro Abs: 6.6 10*3/uL (ref 1.7–7.7)
Neutrophils Relative %: 67 %
Platelets: 248 10*3/uL (ref 150–400)
RBC: 3.82 MIL/uL — ABNORMAL LOW (ref 4.22–5.81)
RDW: 14.9 % (ref 11.5–15.5)
WBC: 10 10*3/uL (ref 4.0–10.5)
nRBC: 0 % (ref 0.0–0.2)

## 2018-07-12 LAB — IMMATURE PLATELET FRACTION: Immature Platelet Fraction: 3.2 % (ref 1.2–8.6)

## 2018-07-12 NOTE — Telephone Encounter (Signed)
Per 4/22 los RTC with Dr Candise Che as needed.

## 2018-08-20 ENCOUNTER — Other Ambulatory Visit: Payer: Self-pay | Admitting: Family Medicine

## 2018-08-22 ENCOUNTER — Other Ambulatory Visit: Payer: Self-pay

## 2018-08-22 ENCOUNTER — Encounter: Payer: Self-pay | Admitting: Podiatry

## 2018-08-22 ENCOUNTER — Ambulatory Visit: Payer: PPO | Admitting: Podiatry

## 2018-08-22 VITALS — Temp 99.0°F

## 2018-08-22 DIAGNOSIS — B351 Tinea unguium: Secondary | ICD-10-CM | POA: Diagnosis not present

## 2018-08-22 DIAGNOSIS — M79676 Pain in unspecified toe(s): Secondary | ICD-10-CM

## 2018-08-22 NOTE — Progress Notes (Signed)
Subjective: 85 y.o. returns the office today for painful, elongated, thickened toenails which he cannot trim himself. Denies any redness or drainage around the nails.  Denies any systemic complaints such as fevers, chills, nausea, vomiting.   Objective: AAO 3, NAD DP/PT pulses palpable, CRT less than 3 seconds Nails hypertrophic, dystrophic, elongated, brittle, discolored 10. All of the nails appear to be discolored and thickened. No open lesions or pre-ulcerative lesions are identified. No pain with calf compression, swelling, warmth, erythema. Overall, doing well with no changes   Assessment: Patient presents with symptomatic onychomycosis  Plan: -Treatment options including alternatives, risks, complications were discussed -Nails sharply debrided 10 without complication/bleeding. -Discussed daily foot inspection. If there are any changes, to call the office immediately.  -Follow-up in 3 months or sooner if any problems are to arise. In the meantime, encouraged to call the office with any questions, concerns, changes symptoms.  Ovid Curd, DPM

## 2018-09-19 ENCOUNTER — Other Ambulatory Visit: Payer: Self-pay | Admitting: Family Medicine

## 2018-09-27 DIAGNOSIS — H401131 Primary open-angle glaucoma, bilateral, mild stage: Secondary | ICD-10-CM | POA: Diagnosis not present

## 2018-09-28 IMAGING — MR MR HEAD W/O CM
8 of 11 series · 37 of 48 positions shown · non-contrast
Comparison: CT head without contrast 07/05/2014

CLINICAL DATA: Encephalopathy. Stage III kidney disease. Known
metallic artifact.

EXAM:
MRI HEAD WITHOUT CONTRAST
TECHNIQUE: Multiplanar, multiecho pulse sequences of the brain and surrounding
structures were obtained without intravenous contrast.

[Series 3: DWI · axial · 3.0mm · 1.09mm/px · z∈[-37,+105]mm · 9 of 98 slices shown (1 of 4)]
[im 1/98]
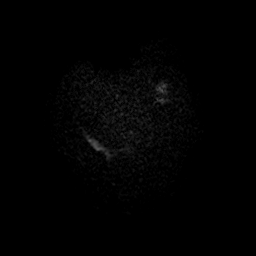
[im 13/98]
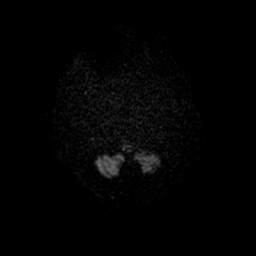
[im 25/98]
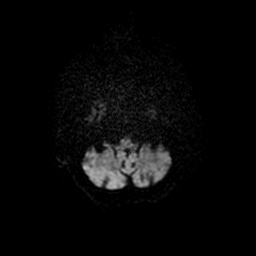
[im 37/98]
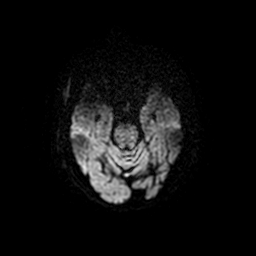
[im 49/98]
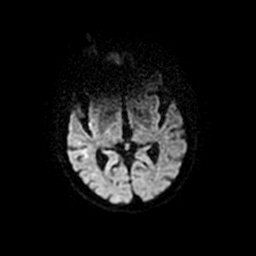
[im 61/98]
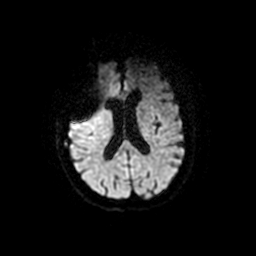
[im 73/98]
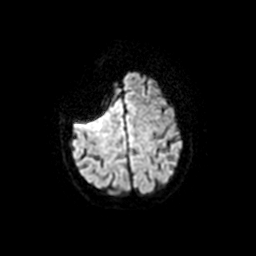
[im 85/98]
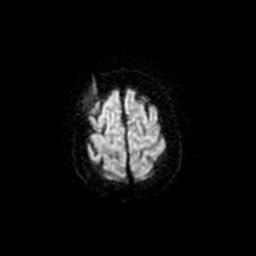
[im 98/98]
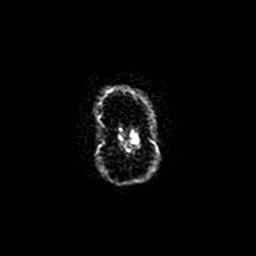

[Series 4: T1 · sagittal · 5.0mm · 0.47mm/px · 2 of 24 slices shown]
[im 1/24]
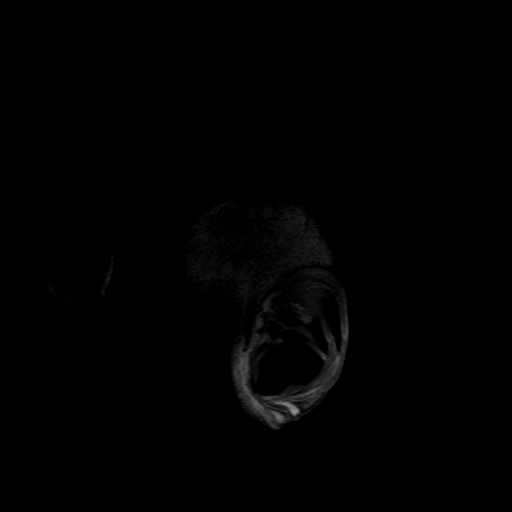
[im 24/24]
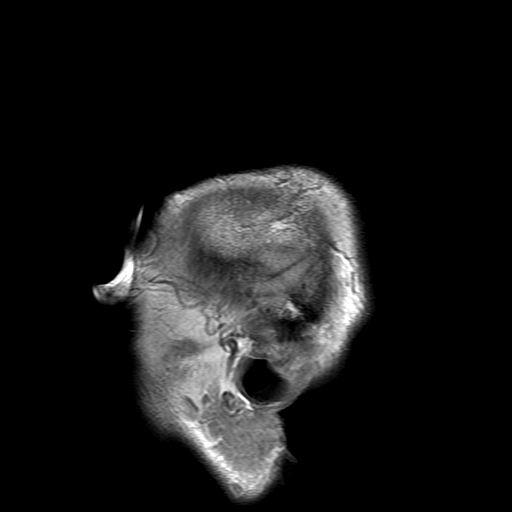

[Series 7: DWI · coronal · 3.0mm · 1.09mm/px · 8 of 100 slices shown (2 of 4)]
[im 1/100]
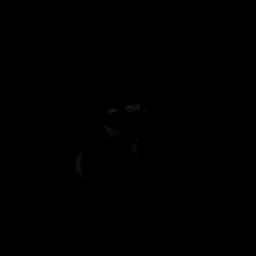
[im 12/100]
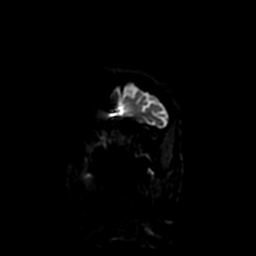
[im 34/100]
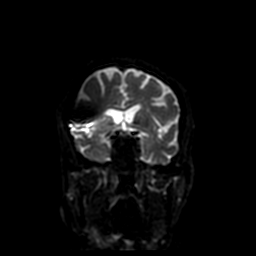
[im 45/100]
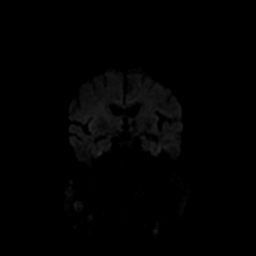
[im 56/100]
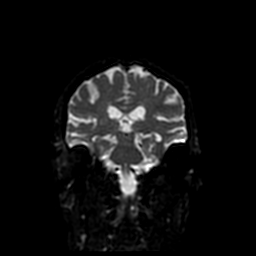
[im 67/100]
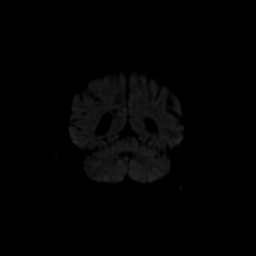
[im 89/100]
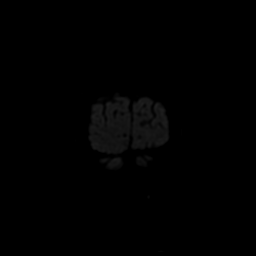
[im 100/100]
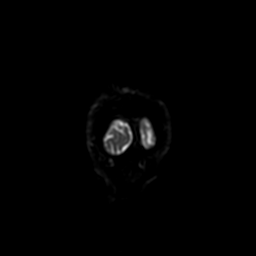

[Series 8: T2 · axial · 5.0mm · 0.43mm/px · z∈[-52,+105]mm · 3 of 28 slices shown (1 of 2)]
[im 1/28]
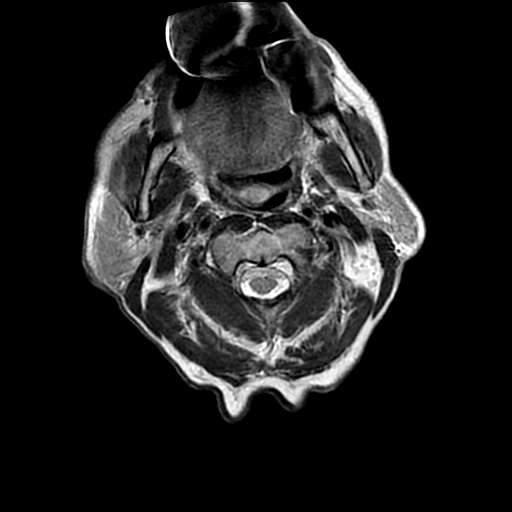
[im 14/28]
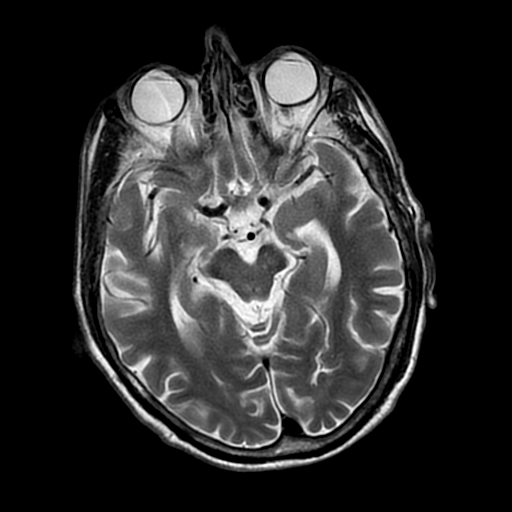
[im 28/28]
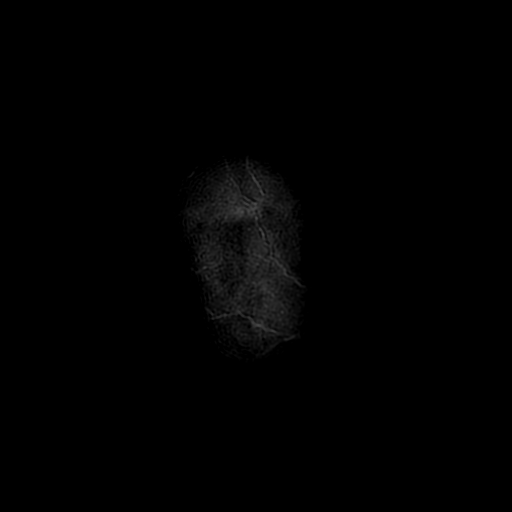

[Series 10: FLAIR · axial · 5.0mm · 0.43mm/px · z∈[-52,+105]mm · 3 of 28 slices shown]
[im 1/28]
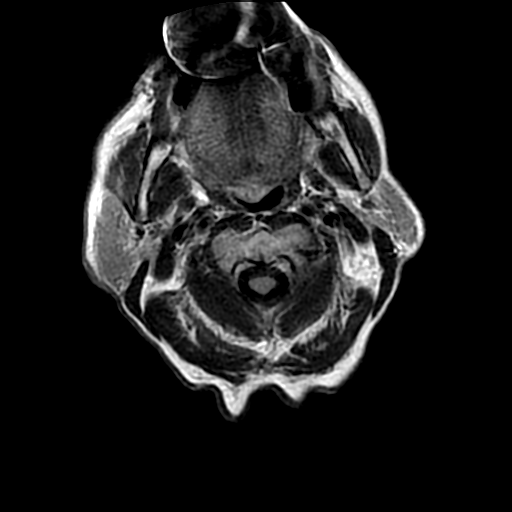
[im 14/28]
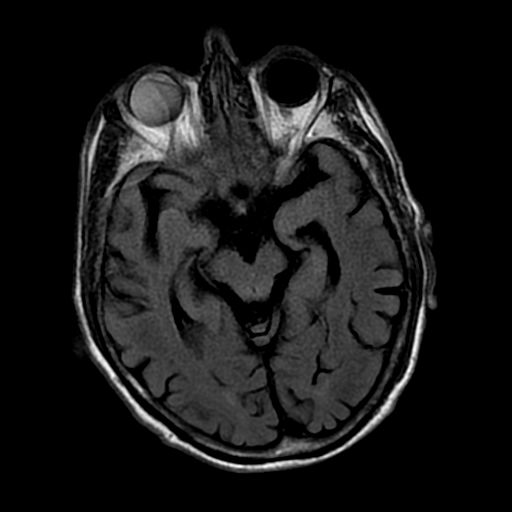
[im 28/28]
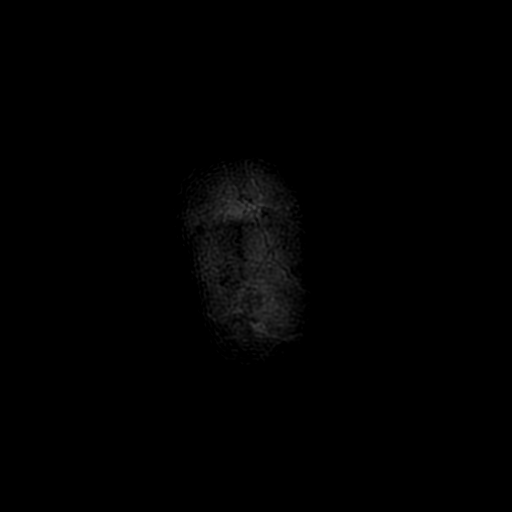

[Series 12: T2 · coronal · 5.0mm · 0.47mm/px · 2 of 24 slices shown (2 of 2)]
[im 1/24]
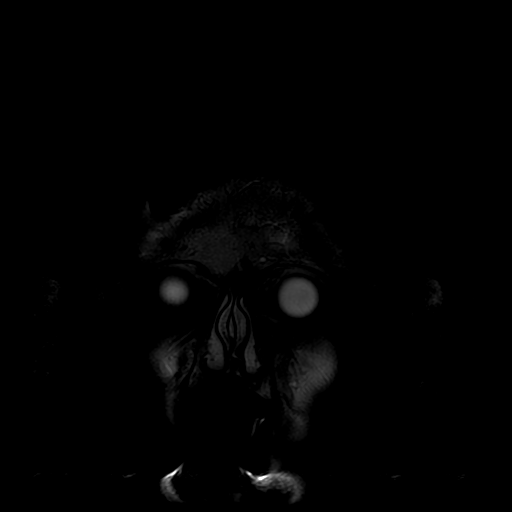
[im 24/24]
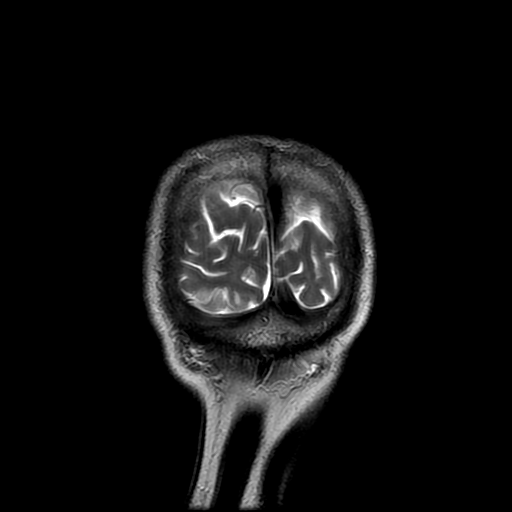

[Series 300: DWI · axial · 3.0mm · 1.09mm/px · z∈[-37,+105]mm · 5 of 49 slices shown (3 of 4)]
[im 1/49]
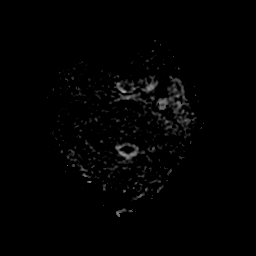
[im 13/49]
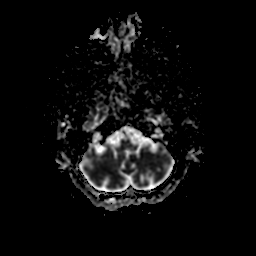
[im 25/49]
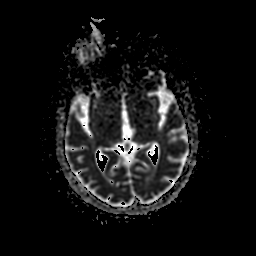
[im 37/49]
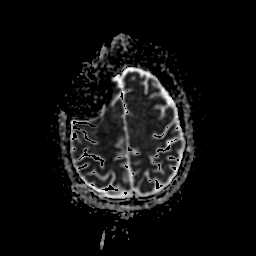
[im 49/49]
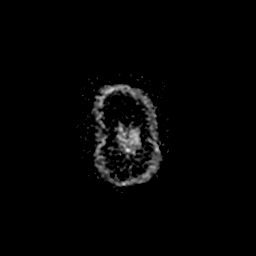

[Series 700: DWI · coronal · 3.0mm · 1.09mm/px · 5 of 50 slices shown (4 of 4)]
[im 1/50]
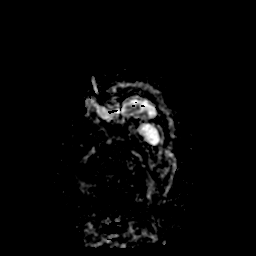
[im 13/50]
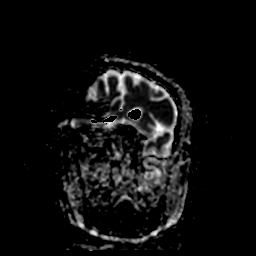
[im 25/50]
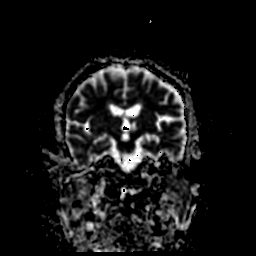
[im 37/50]
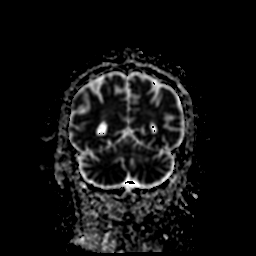
[im 50/50]
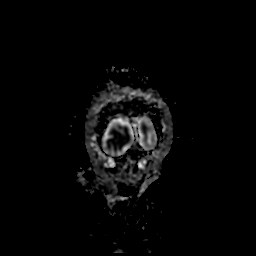

[37 of 48 positions shown; findings below may reference images not displayed]

FINDINGS: Brain: The diffusion-weighted images demonstrate no acute or
subacute infarction. No acute hemorrhage or mass lesion is present.
Ventricles are of normal size. Remote lacunar infarcts are present
in the cerebellum, left greater than right. A remote lacunar infarct
is present in the left thalamus. Minimal periventricular white
matter disease is present. Brainstem is normal. Internal auditory
canals are within normal limits bilaterally. No significant
extraaxial fluid collection is present.

Vascular: Low is present in the major intracranial arteries.

Skull and upper cervical spine: The skull base is within normal
limits. The craniocervical junction is normal. The upper cervical
spine is unremarkable.

Sinuses/Orbits: Mild mucosal thickening is present in the inferior
maxillary sinuses bilaterally. The frontal sinuses are hypoplastic.
Paranasal sinuses are otherwise clear. There is fluid in left
mastoid air cells. No obstructing nasopharyngeal lesion is present.

Bilateral lens replacements are present. Globes and orbits are
within normal limits.
IMPRESSION: 1. No acute or focal lesion to explain the patient's encephalopathy.
2. Remote lacunar infarcts involving the left cerebellum and left
thalamus most notably.
3. Atrophy and white matter disease are otherwise within normal
limits for age.
4. Mild maxillary sinus disease.

## 2018-10-25 ENCOUNTER — Ambulatory Visit: Payer: PPO

## 2018-10-25 ENCOUNTER — Ambulatory Visit (INDEPENDENT_AMBULATORY_CARE_PROVIDER_SITE_OTHER): Payer: PPO

## 2018-10-25 ENCOUNTER — Other Ambulatory Visit: Payer: Self-pay

## 2018-10-25 DIAGNOSIS — Z0000 Encounter for general adult medical examination without abnormal findings: Secondary | ICD-10-CM

## 2018-10-25 NOTE — Patient Instructions (Addendum)
Daniel [name] , Thank you for taking time to come for your Medicare Wellness Visit. I appreciate your ongoing commitment to your health goals. Please review the following plan we discussed and let me know if I can assist you in the future.   Screening recommendations/referrals: Colorectal Screening: not needed   Vision and Dental Exams: Recommended annual ophthalmology exams for early detection of glaucoma and other disorders of the eye Recommended annual dental exams for proper oral hygiene  Vaccinations: Influenza vaccine: recommended this fall  Pneumococcal vaccine: completed  Tdap vaccine: 03/08/18 Shingles vaccine: Please call your insurance company to determine your out of pocket expense for the Shingrix vaccine. You may receive this vaccine at your local pharmacy.  Advanced directives: Please bring a copy of your POA (Power of Attorney) and/or Living Will to your next appointment.  Goals: Maintain current state of health   Next appointment: Please schedule your Annual Wellness Visit with your Nurse Health Advisor in one year.  Preventive Care 1 Years and Older, Male Preventive care refers to lifestyle choices and visits with your health care provider that can promote health and wellness. What does preventive care include?  A yearly physical exam. This is also called an annual well check.  Dental exams once or twice a year.  Routine eye exams. Ask your health care provider how often you should have your eyes checked.  Personal lifestyle choices, including:  Daily care of your teeth and gums.  Regular physical activity.  Eating a healthy diet.  Avoiding tobacco and drug use.  Limiting alcohol use.  Practicing safe sex.  Taking low doses of aspirin every day if recommended by your health care provider..  Taking vitamin and mineral supplements as recommended by your health care provider. What happens during an annual well check? The services and screenings done by  your health care provider during your annual well check will depend on your age, overall health, lifestyle risk factors, and family history of disease. Counseling  Your health care provider may ask you questions about your:  Alcohol use.  Tobacco use.  Drug use.  Emotional well-being.  Home and relationship well-being.  Sexual activity.  Eating habits.  History of falls.  Memory and ability to understand (cognition).  Work and work Statistician. Screening  You may have the following tests or measurements:  Height, weight, and BMI.  Blood pressure.  Lipid and cholesterol levels. These may be checked every 5 years, or more frequently if you are over 33 years old.  Skin check.  Lung cancer screening. You may have this screening every year starting at age 56 if you have a 30-pack-year history of smoking and currently smoke or have quit within the past 15 years.  Fecal occult blood test (FOBT) of the stool. You may have this test every year starting at age 69.  Flexible sigmoidoscopy or colonoscopy. You may have a sigmoidoscopy every 5 years or a colonoscopy every 10 years starting at age 76.  Prostate cancer screening. Recommendations will vary depending on your family history and other risks.  Hepatitis C blood test.  Hepatitis B blood test.  Sexually transmitted disease (STD) testing.  Diabetes screening. This is done by checking your blood sugar (glucose) after you have not eaten for a while (fasting). You may have this done every 1-3 years.  Abdominal aortic aneurysm (AAA) screening. You may need this if you are a current or former smoker.  Osteoporosis. You may be screened starting at age 16 if you  are at high risk. Talk with your health care provider about your test results, treatment options, and if necessary, the need for more tests. Vaccines  Your health care provider [place] recommend certain vaccines, such as:  Influenza vaccine. This is recommended every  year.  Tetanus, diphtheria, and acellular pertussis (Tdap, Td) vaccine. You Pour[place]u need a Td booster every 10 years.  Zoster vaccine. You [place] need this after age 65.  Pneumococcal 13-valent conjugate (PCV13) vaccine. One dose is recommended after age 56.  Pneumococcal polysaccharide (PPSV23) vaccine. One dose is recommended after age 36. Talk to your health care provider about which screenings and vaccines you need and how often you need them. This information is not intended to replace advice given to you by your health care provider. Make sure you discuss any questions you have with your health care provider. Document Released: 04/04/2015 Document Revised: 11/26/2015 Document Reviewed: 01/07/2015 Elsevier Interactive Patient Education  2017 [place] Prevention in the Home Falls can cause injuries. They can happen to people of all ages. There are many things you can do to make your home safe and to help prevent falls. What can I do on the outside of my home?  Regularly fix the edges of walkways and driveways and fix any cracks.  Remove anything that might make you trip as you walk through a door, such as a raised step or threshold.  Trim any bushes or trees on the path to your home.  Use bright outdoor lighting.  Clear any walking paths of anything that might make someone trip, such as rocks or tools.  Regularly check to see if handrails are loose or broken. Make sure that both sides of any steps have handrails.  Any raised decks and porches should have guardrails on the edges.  Have any leaves, snow, or ice cleared regularly.  Use sand or salt on walking paths during winter.  Clean up any spills in your garage right away. This includes oil or grease spills. What can I do in the bathroom?  Use night lights.  Install grab bars by the toilet and in the tub and shower. Do not use towel bars as grab bars.  Use non-skid mats or decals in the tub or shower.  If you  need to sit down in the shower, use a plastic, non-slip stool.  Keep the floor dry. Clean up any water that spills on the floor as soon as it happens.  Remove soap buildup in the tub or shower regularly.  Attach bath mats securely with double-sided non-slip rug tape.  Do not have throw rugs and other things on the floor that can make you trip. What can I do in the bedroom?  Use night lights.  Make sure that you have a light by your bed that is easy to reach.  Do not use any sheets or blankets that are too big for your bed. They should not hang down onto the floor.  Have a firm chair that has side arms. You can use this for support while you get dressed.  Do not have throw rugs and other things on the floor that can make you trip. What can I do in the kitchen?  Clean up any spills right away.  Avoid walking on wet floors.  Keep items that you use a lot in easy-to-reach places.  If you need to reach something above you, use a strong step stool that has a grab bar.  Keep electrical cords out  of the way.  Do not use floor polish or wax that makes floors slippery. If you must use wax, use non-skid floor wax.  Do not have throw rugs and other things on the floor that can make you trip. What can I do with my stairs?  Do not leave any items on the stairs.  Make sure that there are handrails on both sides of the stairs and use them. Fix handrails that are broken or loose. Make sure that handrails are as long as the stairways.  Check any carpeting to make sure that it is firmly attached to the stairs. Fix any carpet that is loose or worn.  Avoid having throw rugs at the top or bottom of the stairs. If you do have throw rugs, attach them to the floor with carpet tape.  Make sure that you have a light switch at the top of the stairs and the bottom of the stairs. If you do not have them, ask someone to add them for you. What else can I do to help prevent falls?  Wear shoes that:   Do not have high heels.  Have rubber bottoms.  Are comfortable and fit you well.  Are closed at the toe. Do not wear sandals.  If you use a stepladder:  Make sure that it is fully opened. Do not climb a closed stepladder.  Make sure that both sides of the stepladder are locked into place.  Ask someone to hold it for you, if possible.  Clearly mark and make sure that you can see:  Any grab bars or handrails.  First and last steps.  Where the edge of each step is.  Use tools that help you move around (mobility aids) if they are needed. These include:  Canes.  Walkers.  Scooters.  Crutches.  Turn on the lights when you go into a dark area. Replace any light bulbs as soon as they burn out.  Set up your furniture so you have a clear path. Avoid moving your furniture around.  If any of your floors are uneven, fix them.  If there are any pets around you, be aware of where they are.  Review your medicines with your doctor. Some medicines can make you feel dizzy. This can increase your chance of falling. Ask your doctor what other things that you can do to help prevent falls. This information is not intended to replace advice given to you by your health care provider. Make sure you discuss any questions you have with your health care provider. Document Released: 01/02/2009 Document Revised: 08/14/2015 Document Reviewed: 04/12/2014 Elsevier Interactive Patient Education  2017 [employer].
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
## 2018-10-26 NOTE — Progress Notes (Signed)
I have reviewed and agree with note, evaluation, plan.   Detrick Dani, MD  

## 2018-10-31 ENCOUNTER — Ambulatory Visit: Payer: PPO | Admitting: Podiatry

## 2018-10-31 ENCOUNTER — Other Ambulatory Visit: Payer: Self-pay

## 2018-10-31 ENCOUNTER — Encounter: Payer: Self-pay | Admitting: Podiatry

## 2018-10-31 VITALS — Temp 98.1°F | Resp 16

## 2018-10-31 DIAGNOSIS — M79676 Pain in unspecified toe(s): Secondary | ICD-10-CM

## 2018-10-31 DIAGNOSIS — B351 Tinea unguium: Secondary | ICD-10-CM

## 2018-11-01 NOTE — Progress Notes (Signed)
Subjective: 85 y.o. returns the office today for painful, elongated, thickened toenails which he cannot trim himself. Denies any redness or drainage around the nails.  Denies any systemic complaints such as fevers, chills, nausea, vomiting.   Objective: AAO 3, NAD DP/PT pulses palpable, CRT less than 3 seconds Nails hypertrophic, dystrophic, elongated, brittle, discolored 10. All of the nails appear to be discolored and thickened. No open lesions or pre-ulcerative lesions are identified. No pain with calf compression, swelling, warmth, erythema. No changes.   Assessment: Patient presents with symptomatic onychomycosis  Plan: -Treatment options including alternatives, risks, complications were discussed -Nails sharply debrided 10 without complication/bleeding. -Discussed daily foot inspection. If there are any changes, to call the office immediately.  -Follow-up in 3 months or sooner if any problems are to arise. In the meantime, encouraged to call the office with any questions, concerns, changes symptoms.  Celesta Gentile, DPM

## 2018-11-25 ENCOUNTER — Other Ambulatory Visit: Payer: Self-pay | Admitting: Family Medicine

## 2018-11-27 ENCOUNTER — Other Ambulatory Visit: Payer: Self-pay | Admitting: Family Medicine

## 2018-12-04 ENCOUNTER — Ambulatory Visit (INDEPENDENT_AMBULATORY_CARE_PROVIDER_SITE_OTHER): Payer: PPO | Admitting: Family Medicine

## 2018-12-04 ENCOUNTER — Encounter: Payer: Self-pay | Admitting: Family Medicine

## 2018-12-04 VITALS — Ht 66.0 in | Wt 135.0 lb

## 2018-12-04 DIAGNOSIS — N183 Chronic kidney disease, stage 3 unspecified: Secondary | ICD-10-CM

## 2018-12-04 DIAGNOSIS — E785 Hyperlipidemia, unspecified: Secondary | ICD-10-CM

## 2018-12-04 DIAGNOSIS — N400 Enlarged prostate without lower urinary tract symptoms: Secondary | ICD-10-CM

## 2018-12-04 DIAGNOSIS — M109 Gout, unspecified: Secondary | ICD-10-CM | POA: Diagnosis not present

## 2018-12-04 DIAGNOSIS — I10 Essential (primary) hypertension: Secondary | ICD-10-CM | POA: Diagnosis not present

## 2018-12-04 DIAGNOSIS — D693 Immune thrombocytopenic purpura: Secondary | ICD-10-CM

## 2018-12-04 NOTE — Patient Instructions (Addendum)
Health Maintenance Due  Topic Date Due  . INFLUENZA VACCINE - declines 10/21/2018    Depression screen [facility] 2/9 06/05/2018 03/08/2018 08/04/2017  Decreased Interest 0 0 0  Down, Depressed, Hopeless 0 0 0  PHQ - 2 Score 0 0 0  Some recent data might be hidden

## 2018-12-04 NOTE — Progress Notes (Signed)
Phone [phone]   Subjective:  Virtual visit via phonenote Chief Complaint  Patient presents with  . Follow-up  . Atrial Fibrillation  . Dyslipidemia  . Chronic Kidney Disease  . Gout  . Hypertension  . Benign Prostatic Hypertrophy    This visit type was conducted due to national recommendations for restrictions regarding the COVID-19 Pandemic (e.g. social distancing).  This format is felt to be most appropriate for this patient at this time balancing risks to patient and risks to population by having him in for in person visit.  All issues noted in this document were discussed and addressed.  No physical exam was performed (except for noted visual exam or audio findings with Telehealth visits).  The patient has consented to conduct a Telehealth visit and understands insurance will be billed.   Our team/I connected with Daniel [name] at 11:20 AM EDT by phone (patient did not have equipment for webex) and verified that I am speaking with the correct person using two identifiers.  Location patient: [place] Location provider: [facility], office Persons participating in the virtual visit:  patient  Time on phone: 12 minutes Counseling provided about COVID-19, dealing with not being able to see his wife  Our team/I discussed the limitations of evaluation and management by telemedicine and the availability of in person appointments. In light of current covid-19 pandemic, patient also understands that we are trying to protect them by minimizing in office contact if at all possible.  The patient expressed consent for telemedicine visit and agreed to proceed. Patient understands insurance will be billed.   ROS- Denies HA, dizziness, CP, SOB, visual changes   Past Medical History-  Patient Active Problem List   Diagnosis Date Noted  . Acute ITP (HCC)     Priority: High  . Leukocytosis 08/04/2017    Priority: Medium  . BPH (benign prostatic hyperplasia) 10/31/2013    Priority: Medium   . Dyslipidemia 08/24/2006    Priority: Medium  . GOUT 08/24/2006    Priority: Medium  . Essential hypertension 08/24/2006    Priority: Medium  . CKD (chronic kidney disease), stage III (HCC) 08/24/2006    Priority: Medium  . Caregiver burden 04/06/2017    Priority: Low  . Encounter for therapeutic drug monitoring 07/05/2013    Priority: Low  . PULMONARY NODULE, RIGHT UPPER LOBE 11/24/2007    Priority: Low  . Thrombocytopenia (HCC) 05/24/2018  . Syncope 11/20/2017  . Bleeding 03/29/2014    Medications- reviewed and updated Current Outpatient Medications  Medication Sig Dispense Refill  . allopurinol (ZYLOPRIM) 300 MG tablet TAKE ONE TABLET BY MOUTH DAILY 90 tablet 0  . amLODipine (NORVASC) 5 MG tablet Take 1 tablet (5 mg total) by mouth daily. 90 tablet 0  . atorvastatin (LIPITOR) 20 MG tablet TAKE ONE TABLET BY MOUTH DAILY AT 6 PM (REPLACES SIMVASTATIN) (Patient taking differently: Take 20 mg by mouth daily at 6 PM. ) 90 tablet 2  . brimonidine (ALPHAGAN) 0.2 % ophthalmic solution Place 1 drop into both eyes 2 (two) times daily.     . folic acid (FOLVITE) 1 MG tablet Take 1 tablet (1 mg total) by mouth daily. (Patient taking differently: Take 1 mg by mouth every evening. ) 30 tablet 2  . tamsulosin (FLOMAX) 0.4 MG CAPS capsule TAKE ONE CAPSULE BY MOUTH DAILY 90 capsule 0  . VOLTAREN 1 % GEL Apply 1 application topically daily as needed (for knee pain).      No current facility-administered medications for  this visit.      Objective:  Ht 5\' 6"  (1.676 m)   Wt 135 lb (61.2 kg)   BMI 21.79 kg/m  self reported vitals  Nonlabored voice, normal speech     Assessment and Plan  #Atrial fibrillation #ITP S: Compliant with warfarin in past- later developed ITP and he declines restarting- gets nosebleeds even without it.  Rate controlled without medication in office in past- he states "anywhere I go its normal" such as at eye doctor A/P:  Off rate controlled medication appears to  remain rate controlled.  Declines anticoagulation due to ITP-does agree to come by for labs-we discussed with ITP recommendation was labs every month for 6 months then every 2 months for 6 months then every 3 to 4 months- he agrees after next labs to schedule II month lab follow-up.  Also advised 54-month physical  #Dyslipidemia S: Compliant with atorvastatin 20 mg. Lab Results  Component Value Date   CHOL 129 07/06/2017   HDL 41.70 07/06/2017   LDLCALC 73 07/06/2017   LDLDIRECT 160.7 09/13/2008   TRIG 70.0 07/06/2017   CHOLHDL 3 07/06/2017  A/P: LDL goal less than 100 at least- update lipids when he comes by for labs   #Gout S: Compliant with allopurinol 300 mg.  No recent flareups.  A/P: well controlled on allopurinol- we will update uric acid when he comes by for labs   #CKD stage III S:GFR in 56s.  Patient knows to avoid NSAIDs  A/P: hopefully stable- he agrees to come by for labs   #Essential hypertension S:Controlled on amlodipine 5 mg. Not checking BP at home. . Reports decreased appetite, doesn't feel like eating anymore. No restricting salt intake. Not exercising regularly.  A/P: hopefully blood pressure is still controlled- recommended he get a home cuff- he states he will consider. Continue amlodipine 5 mg for now   #BPH S: Stable on Flomax 0.4 mg daily-compliant with medication. A/P: Stable. Continue current medications.    Recommended follow up: 37-month physical, 2 and four-month labs after setting up labs in 2 weeks or so Future Appointments  Date Time Provider [place]  01/30/2019  1:15 PM Trula Slade, DPM [facility]    Lab/Order associations:   ICD-10-CM   1. Essential hypertension  I10 Comprehensive metabolic panel    Lipid panel  2. Acute ITP (HCC)  D69.3 CBC with Differential/Platelet  3. Gout, unspecified cause, unspecified chronicity, unspecified site  M10.9 Uric acid  4. Dyslipidemia  E78.5   5. CKD (chronic kidney disease),  stage III (HCC)  N18.3   6. Benign prostatic hyperplasia without lower urinary tract symptoms  N40.0    Return precautions advised.  Garret Reddish, MD

## 2018-12-08 ENCOUNTER — Other Ambulatory Visit: Payer: Self-pay

## 2018-12-08 ENCOUNTER — Other Ambulatory Visit (INDEPENDENT_AMBULATORY_CARE_PROVIDER_SITE_OTHER): Payer: PPO

## 2018-12-08 DIAGNOSIS — I10 Essential (primary) hypertension: Secondary | ICD-10-CM | POA: Diagnosis not present

## 2018-12-08 DIAGNOSIS — D693 Immune thrombocytopenic purpura: Secondary | ICD-10-CM | POA: Diagnosis not present

## 2018-12-08 DIAGNOSIS — M109 Gout, unspecified: Secondary | ICD-10-CM

## 2018-12-08 LAB — LIPID PANEL
Cholesterol: 119 mg/dL (ref 0–200)
HDL: 34 mg/dL — ABNORMAL LOW (ref >39.00)
LDL Cholesterol: 65 mg/dL (ref 0–99)
NonHDL: 84.55
Total CHOL/HDL Ratio: 3
Triglycerides: 96 mg/dL (ref 0.0–149.0)
VLDL: 19.2 mg/dL (ref 0.0–40.0)

## 2018-12-08 LAB — COMPREHENSIVE METABOLIC PANEL
ALT: 20 U/L (ref 0–53)
AST: 35 U/L (ref 0–37)
Albumin: 3.5 g/dL (ref 3.5–5.2)
Alkaline Phosphatase: 306 U/L — ABNORMAL HIGH (ref 39–117)
BUN: 24 mg/dL — ABNORMAL HIGH (ref 6–23)
CO2: 23 mEq/L (ref 19–32)
Calcium: 9.5 mg/dL (ref 8.4–10.5)
Chloride: 105 mEq/L (ref 96–112)
Creatinine, Ser: 1.93 mg/dL — ABNORMAL HIGH (ref 0.40–1.50)
GFR: 33.32 mL/min — ABNORMAL LOW (ref >60.00)
Glucose, Bld: 85 mg/dL (ref 70–99)
Potassium: 4.2 mEq/L (ref 3.5–5.1)
Sodium: 137 mEq/L (ref 135–145)
Total Bilirubin: 0.4 mg/dL (ref 0.2–1.2)
Total Protein: 7.1 g/dL (ref 6.0–8.3)

## 2018-12-08 LAB — CBC WITH DIFFERENTIAL/PLATELET
Basophils Absolute: 0.1 10*3/uL (ref 0.0–0.1)
Basophils Relative: 0.8 % (ref 0.0–3.0)
Eosinophils Absolute: 0.4 10*3/uL (ref 0.0–0.7)
Eosinophils Relative: 3.3 % (ref 0.0–5.0)
HCT: 33.5 % — ABNORMAL LOW (ref 39.0–52.0)
Hemoglobin: 10.9 g/dL — ABNORMAL LOW (ref 13.0–17.0)
Lymphocytes Relative: 22.2 % (ref 12.0–46.0)
Lymphs Abs: 2.8 10*3/uL (ref 0.7–4.0)
MCHC: 32.6 g/dL (ref 30.0–36.0)
MCV: 95.8 fl (ref 78.0–100.0)
Monocytes Absolute: 1.2 10*3/uL — ABNORMAL HIGH (ref 0.1–1.0)
Monocytes Relative: 9.1 % (ref 3.0–12.0)
Neutro Abs: 8.2 10*3/uL — ABNORMAL HIGH (ref 1.4–7.7)
Neutrophils Relative %: 64.6 % (ref 43.0–77.0)
Platelets: 270 10*3/uL (ref 150.0–400.0)
RBC: 3.5 Mil/uL — ABNORMAL LOW (ref 4.22–5.81)
RDW: 16.2 % — ABNORMAL HIGH (ref 11.5–15.5)
WBC: 12.7 10*3/uL — ABNORMAL HIGH (ref 4.0–10.5)

## 2018-12-08 LAB — URIC ACID: Uric Acid, Serum: 3.4 mg/dL — ABNORMAL LOW (ref 4.0–7.8)

## 2018-12-12 ENCOUNTER — Other Ambulatory Visit: Payer: Self-pay

## 2018-12-12 ENCOUNTER — Telehealth: Payer: Self-pay | Admitting: Family Medicine

## 2018-12-12 MED ORDER — ALLOPURINOL 100 MG PO TABS: 100.0000 mg | ORAL_TABLET | Freq: Two times a day (BID) | ORAL | 1 refills | Status: DC

## 2018-12-12 NOTE — Telephone Encounter (Signed)
See note  Copied from [place] [id number]. Topic: General - Other >> Dec 12, 2018  4:56 PM Mathis Bud wrote: Reason for CRM: Patient would like a call back to go over lab results.   Call back [phone]

## 2018-12-13 ENCOUNTER — Other Ambulatory Visit: Payer: Self-pay

## 2018-12-13 DIAGNOSIS — D693 Immune thrombocytopenic purpura: Secondary | ICD-10-CM

## 2018-12-13 DIAGNOSIS — D72829 Elevated white blood cell count, unspecified: Secondary | ICD-10-CM

## 2018-12-13 NOTE — Telephone Encounter (Signed)
See results note. 

## 2018-12-20 ENCOUNTER — Telehealth: Payer: Self-pay | Admitting: Hematology

## 2018-12-20 ENCOUNTER — Telehealth: Payer: Self-pay | Admitting: *Deleted

## 2018-12-20 NOTE — Telephone Encounter (Signed)
Called pt per 9/29 sch message - per pt did not want to schedule an appt now and will call back to set something up later.   msg sent to RN to make her aware.

## 2018-12-20 NOTE — Telephone Encounter (Signed)
Office contacted via [place] Patient coordinator: Dr. Yong Channel has referred Mr. Mackowski back to [facility] for acute ITP and leukocytosis.  Message was sent to scheduling to make appt for patient. Scheduler contacted patient: Patient not ready to set up an appt yet and will call back to schedule something. "He is waiting for wife to pass."

## 2018-12-21 ENCOUNTER — Ambulatory Visit: Payer: Self-pay | Admitting: *Deleted

## 2018-12-21 NOTE — Telephone Encounter (Signed)
Patient calls with nose bleeds. Not bleeding at this time but has been having nose bleeds most days, lately. Patient is poor historian and is difficult to get accurate information from. Stated he blows his nose a lot-sometimes will have stringy clots like yesterday. Afterwards, blood trickles for several hours. Denies dizziness denies injury to nose. Reviewed instructions on proper pinching to help the clotting process. Use small amount of Vaseline to the area. Use NS spray in each nostril several times daily but don't blow afterwards.Avoid blowing as much as possible.Turn on humidifier in your home. If bleeding can't be stopped next time with above interventions, call back immediately. Routing to PCP for any additional advice due to MG:QQPYP ITP.  Reason for Disposition . Taking Coumadin (warfarin) or other strong blood thinner, or known bleeding disorder (e.g., thrombocytopenia) . Hard-to-stop nosebleeds are a chronic symptom (recurrent or ongoing AND present > 4 weeks)  Answer Assessment - Initial Assessment Questions 1. AMOUNT OF BLEEDING: "How bad is the bleeding?" "How much blood was lost?" "Has the bleeding stopped?" Bleeding has stopped.    - MILD: needed a couple tissues   - MODERATE: needed many tissues   - SEVERE: large blood clots, soaked many tissues, lasted more than 30 minutes      Trickle bled for hours yesterday. 2. ONSET: "When did the nosebleed start?" stated it bleeds most days. 3. FREQUENCY: "How many nosebleeds have you had in the last 24 hours?"      4. RECURRENT SYMPTOMS: "Have there been other recent nosebleeds?" If so, ask: "How long did it take you to stop the bleeding?" "What worked best?"     He is unsure 5. CAUSE: "What do you think caused this nosebleed?"     No idea. Years ago "I pulled all the hairs from inside the nose, out" 6. LOCAL FACTORS: "Do you have any cold symptoms?", "Have you been rubbing or picking at your nose?"     No but blows the nose often. 7.  SYSTEMIC FACTORS: "Do you have high blood pressure or any bleeding problems?"     no 8. BLOOD THINNERS: "Do you take any blood thinners?" (e.g., coumadin, heparin, aspirin, Plavix)    no 9. OTHER SYMPTOMS: "Do you have any other symptoms?" (e.g., lightheadedness)     no 10. PREGNANCY: "Is there any chance you are pregnant?" "When was your last menstrual period?"       na  Protocols used: [facility]

## 2018-12-21 NOTE — Telephone Encounter (Signed)
See below

## 2018-12-21 NOTE — Telephone Encounter (Signed)
Lets get a CBC with differential under thrombocytopenia to make sure levels are not low-please help set this up.  If platelets are okay- referral to ear nose and throat would be reasonable

## 2018-12-22 ENCOUNTER — Other Ambulatory Visit: Payer: Self-pay

## 2018-12-22 DIAGNOSIS — D696 Thrombocytopenia, unspecified: Secondary | ICD-10-CM

## 2018-12-22 NOTE — Telephone Encounter (Signed)
FYI: Pt declines lab visit. He said the bleeds have stopped and he just had bloodwork done a few days ago and didn't feel it necessary to have more labs done. I advised pt that you wanted to make sure his platelets and blood counts were ok and if the bleeds continued you would like him to see ENT. Pt stated "ok".

## 2018-12-22 NOTE — Telephone Encounter (Signed)
Noted thanks °

## 2018-12-26 ENCOUNTER — Other Ambulatory Visit: Payer: Self-pay | Admitting: Family Medicine

## 2019-01-28 ENCOUNTER — Other Ambulatory Visit: Payer: Self-pay | Admitting: Family Medicine

## 2019-01-30 ENCOUNTER — Other Ambulatory Visit: Payer: Self-pay

## 2019-01-30 ENCOUNTER — Encounter: Payer: Self-pay | Admitting: Podiatry

## 2019-01-30 ENCOUNTER — Ambulatory Visit: Payer: PPO | Admitting: Podiatry

## 2019-01-30 DIAGNOSIS — B351 Tinea unguium: Secondary | ICD-10-CM | POA: Diagnosis not present

## 2019-01-30 DIAGNOSIS — M79676 Pain in unspecified toe(s): Secondary | ICD-10-CM

## 2019-01-30 DIAGNOSIS — D689 Coagulation defect, unspecified: Secondary | ICD-10-CM

## 2019-01-30 NOTE — Progress Notes (Signed)
Subjective: 85 y.o. returns the office today for painful, elongated, thickened toenails which he cannot trim himself. Denies any redness or drainage around the nails.  Denies any systemic complaints such as fevers, chills, nausea, vomiting.   He has purchased some topical medication for toenail fungus but has not yet started it.   Objective: AAO 3, NAD DP/PT pulses palpable, CRT less than 3 seconds Nails hypertrophic, dystrophic, elongated, brittle, discolored 10. All of the nails appear to be discolored and thickened. No open lesions or pre-ulcerative lesions are identified. No pain with calf compression, swelling, warmth, erythema. No changes.   Assessment: Patient presents with symptomatic onychomycosis  Plan: -Treatment options including alternatives, risks, complications were discussed -Nails sharply debrided 10 without complication/bleeding. -Discussed the treatment for nail fungus and potential side affects.  -Discussed daily foot inspection. If there are any changes, to call the office immediately.  -Follow-up in 3 months or sooner if any problems are to arise. In the meantime, encouraged to call the office with any questions, concerns, changes symptoms.  Celesta Gentile, DPM

## 2019-03-21 ENCOUNTER — Other Ambulatory Visit: Payer: Self-pay | Admitting: Family Medicine

## 2019-04-03 ENCOUNTER — Other Ambulatory Visit: Payer: Self-pay | Admitting: Family Medicine

## 2019-05-03 ENCOUNTER — Ambulatory Visit: Payer: PPO | Admitting: Podiatry

## 2019-05-22 ENCOUNTER — Other Ambulatory Visit: Payer: Self-pay

## 2019-05-22 ENCOUNTER — Encounter: Payer: Self-pay | Admitting: Podiatry

## 2019-05-22 ENCOUNTER — Ambulatory Visit: Payer: PPO | Admitting: Podiatry

## 2019-05-22 DIAGNOSIS — B351 Tinea unguium: Secondary | ICD-10-CM

## 2019-05-22 DIAGNOSIS — D689 Coagulation defect, unspecified: Secondary | ICD-10-CM

## 2019-05-22 DIAGNOSIS — M79676 Pain in unspecified toe(s): Secondary | ICD-10-CM | POA: Diagnosis not present

## 2019-05-23 NOTE — Progress Notes (Signed)
Subjective: 86 y.o. returns the office today for painful, elongated, thickened toenails which he cannot trim himself.  Denies any redness or drainage around the nails.  Denies any systemic complaints such as fevers, chills, nausea, vomiting.   Objective: AAO 3, NAD DP/PT pulses palpable, CRT less than 3 seconds Nails hypertrophic, dystrophic, elongated, brittle, discolored 10. All of the nails appear to be discolored and thickened. No open lesions or pre-ulcerative lesions are identified. No pain with calf compression, swelling, warmth, erythema. No changes.   Assessment: Patient presents with symptomatic onychomycosis  Plan: -Treatment options including alternatives, risks, complications were discussed -Nails sharply debrided 10 without complication/bleeding. -Discussed daily foot inspection. If there are any changes, to call the office immediately.  -Follow-up in 3 months or sooner if any problems are to arise. In the meantime, encouraged to call the office with any questions, concerns, changes symptoms.  Ovid Curd, DPM

## 2019-06-25 ENCOUNTER — Other Ambulatory Visit: Payer: Self-pay | Admitting: Family Medicine

## 2019-06-26 ENCOUNTER — Other Ambulatory Visit: Payer: Self-pay | Admitting: Family Medicine

## 2019-07-05 DIAGNOSIS — H401131 Primary open-angle glaucoma, bilateral, mild stage: Secondary | ICD-10-CM | POA: Diagnosis not present

## 2019-08-07 ENCOUNTER — Other Ambulatory Visit: Payer: Self-pay

## 2019-08-07 ENCOUNTER — Ambulatory Visit: Payer: PPO | Admitting: Podiatry

## 2019-08-07 ENCOUNTER — Encounter: Payer: Self-pay | Admitting: Podiatry

## 2019-08-07 DIAGNOSIS — B351 Tinea unguium: Secondary | ICD-10-CM | POA: Diagnosis not present

## 2019-08-07 DIAGNOSIS — M79676 Pain in unspecified toe(s): Secondary | ICD-10-CM | POA: Diagnosis not present

## 2019-08-07 DIAGNOSIS — D689 Coagulation defect, unspecified: Secondary | ICD-10-CM

## 2019-08-13 NOTE — Progress Notes (Signed)
Subjective: 86 y.o. returns the office today for painful, elongated, thickened toenails which he cannot trim himself. No open lesions and he has no new concerns or any changes since I last saw him. Denies any systemic complaints such as fevers, chills, nausea, vomiting.   Objective: AAO 3, NAD DP/PT pulses palpable, CRT less than 3 seconds Nails hypertrophic, dystrophic, elongated, brittle, discolored 10. The nails appear to be hypertrophic, discolored and thickened. Tenderness to nails 1-5 bilaterally. No open lesions or pre-ulcerative lesions are identified. No pain with calf compression, swelling, warmth, erythema. No changes.   Assessment: Patient presents with symptomatic onychomycosis  Plan: -Treatment options including alternatives, risks, complications were discussed -Nails sharply debrided 10 without complication/bleeding. -Discussed daily foot inspection. If there are any changes, to call the office immediately.  -Follow-up in 3 months or sooner if any problems are to arise. In the meantime, encouraged to call the office with any questions, concerns, changes symptoms.  Ovid Curd, DPM

## 2019-08-19 ENCOUNTER — Other Ambulatory Visit: Payer: Self-pay | Admitting: Family Medicine

## 2019-09-28 ENCOUNTER — Other Ambulatory Visit: Payer: Self-pay

## 2019-09-28 MED ORDER — ALLOPURINOL 100 MG PO TABS: 100.0000 mg | ORAL_TABLET | Freq: Two times a day (BID) | ORAL | 1 refills | Status: DC

## 2019-09-28 MED ORDER — AMLODIPINE BESYLATE 5 MG PO TABS: 5.0000 mg | ORAL_TABLET | Freq: Every day | ORAL | 0 refills | Status: DC

## 2019-09-28 MED ORDER — TAMSULOSIN HCL 0.4 MG PO CAPS: 0.4000 mg | ORAL_CAPSULE | Freq: Every day | ORAL | 0 refills | Status: DC

## 2019-10-02 ENCOUNTER — Other Ambulatory Visit: Payer: Self-pay

## 2019-10-02 MED ORDER — ALLOPURINOL 100 MG PO TABS: 100.0000 mg | ORAL_TABLET | Freq: Two times a day (BID) | ORAL | 1 refills | Status: AC

## 2019-10-02 MED ORDER — AMLODIPINE BESYLATE 5 MG PO TABS: 5.0000 mg | ORAL_TABLET | Freq: Every day | ORAL | 0 refills | Status: DC

## 2019-10-02 MED ORDER — TAMSULOSIN HCL 0.4 MG PO CAPS: 0.4000 mg | ORAL_CAPSULE | Freq: Every day | ORAL | 0 refills | Status: DC

## 2019-10-02 MED ORDER — ATORVASTATIN CALCIUM 20 MG PO TABS: ORAL_TABLET | ORAL | 0 refills | Status: DC

## 2019-10-15 ENCOUNTER — Ambulatory Visit: Payer: PPO | Admitting: Podiatry

## 2019-10-15 ENCOUNTER — Encounter: Payer: Self-pay | Admitting: Podiatry

## 2019-10-15 ENCOUNTER — Other Ambulatory Visit: Payer: Self-pay

## 2019-10-15 DIAGNOSIS — D689 Coagulation defect, unspecified: Secondary | ICD-10-CM

## 2019-10-15 DIAGNOSIS — B351 Tinea unguium: Secondary | ICD-10-CM | POA: Diagnosis not present

## 2019-10-15 DIAGNOSIS — M79676 Pain in unspecified toe(s): Secondary | ICD-10-CM

## 2019-10-15 NOTE — Progress Notes (Signed)
Subjective: 86 y.o. returns the office today for painful, elongated, thickened toenails which he cannot trim himself. No new concerns or any changes since I last saw him. Denies any systemic complaints such as fevers, chills, nausea, vomiting.   Objective: AAO 3, NAD DP/PT pulses palpable, CRT less than 3 seconds Nails hypertrophic, dystrophic, elongated, brittle, discolored 10. The nails appear to be hypertrophic, discolored and thickened. Tenderness to nails 1-5 bilaterally. No open lesions or pre-ulcerative lesions are identified. No pain with calf compression, swelling, warmth, erythema. No changes.   Assessment: Patient presents with symptomatic onychomycosis  Plan: -Treatment options including alternatives, risks, complications were discussed -Nails sharply debrided 10 without complication/bleeding. -Discussed daily foot inspection. If there are any changes, to call the office immediately.  -Follow-up in as scheduled or sooner if any problems are to arise. In the meantime, encouraged to call the office with any questions, concerns, changes symptoms.  Ovid Curd, DPM

## 2019-10-29 ENCOUNTER — Telehealth: Payer: Self-pay | Admitting: Family Medicine

## 2019-10-29 NOTE — Telephone Encounter (Signed)
Patient said he would call back if he wanted to schedule.

## 2019-10-29 NOTE — Telephone Encounter (Signed)
Left message for patient to call back and schedule Medicare Annual Wellness Visit (AWV) either virtually/audio only OR in office. Whatever the patients preference is.  Last AWV 10/25/18; please schedule at anytime with [facility]Nurse Health Advisor at [facility]  This should be a 45 minute visit.

## 2019-11-25 ENCOUNTER — Encounter (HOSPITAL_COMMUNITY): Payer: Self-pay

## 2019-11-25 ENCOUNTER — Emergency Department (HOSPITAL_COMMUNITY): Payer: Medicare HMO

## 2019-11-25 ENCOUNTER — Other Ambulatory Visit: Payer: Self-pay

## 2019-11-25 DIAGNOSIS — I129 Hypertensive chronic kidney disease with stage 1 through stage 4 chronic kidney disease, or unspecified chronic kidney disease: Secondary | ICD-10-CM | POA: Diagnosis not present

## 2019-11-25 DIAGNOSIS — S72111A Displaced fracture of greater trochanter of right femur, initial encounter for closed fracture: Secondary | ICD-10-CM | POA: Diagnosis not present

## 2019-11-25 DIAGNOSIS — S299XXA Unspecified injury of thorax, initial encounter: Secondary | ICD-10-CM | POA: Diagnosis not present

## 2019-11-25 DIAGNOSIS — M6281 Muscle weakness (generalized): Secondary | ICD-10-CM | POA: Insufficient documentation

## 2019-11-25 DIAGNOSIS — Z79899 Other long term (current) drug therapy: Secondary | ICD-10-CM | POA: Insufficient documentation

## 2019-11-25 DIAGNOSIS — Y92511 Restaurant or cafe as the place of occurrence of the external cause: Secondary | ICD-10-CM | POA: Insufficient documentation

## 2019-11-25 DIAGNOSIS — Y939 Activity, unspecified: Secondary | ICD-10-CM | POA: Insufficient documentation

## 2019-11-25 DIAGNOSIS — Z87891 Personal history of nicotine dependence: Secondary | ICD-10-CM | POA: Insufficient documentation

## 2019-11-25 DIAGNOSIS — R52 Pain, unspecified: Secondary | ICD-10-CM | POA: Diagnosis not present

## 2019-11-25 DIAGNOSIS — G9389 Other specified disorders of brain: Secondary | ICD-10-CM | POA: Diagnosis not present

## 2019-11-25 DIAGNOSIS — E785 Hyperlipidemia, unspecified: Secondary | ICD-10-CM | POA: Diagnosis not present

## 2019-11-25 DIAGNOSIS — M25461 Effusion, right knee: Secondary | ICD-10-CM | POA: Diagnosis not present

## 2019-11-25 DIAGNOSIS — Z20822 Contact with and (suspected) exposure to covid-19: Secondary | ICD-10-CM | POA: Diagnosis not present

## 2019-11-25 DIAGNOSIS — M1711 Unilateral primary osteoarthritis, right knee: Secondary | ICD-10-CM | POA: Diagnosis not present

## 2019-11-25 DIAGNOSIS — M25561 Pain in right knee: Secondary | ICD-10-CM | POA: Diagnosis not present

## 2019-11-25 DIAGNOSIS — S0990XA Unspecified injury of head, initial encounter: Secondary | ICD-10-CM | POA: Diagnosis not present

## 2019-11-25 DIAGNOSIS — J320 Chronic maxillary sinusitis: Secondary | ICD-10-CM | POA: Diagnosis not present

## 2019-11-25 DIAGNOSIS — M1611 Unilateral primary osteoarthritis, right hip: Secondary | ICD-10-CM | POA: Diagnosis not present

## 2019-11-25 DIAGNOSIS — N179 Acute kidney failure, unspecified: Secondary | ICD-10-CM | POA: Insufficient documentation

## 2019-11-25 DIAGNOSIS — N1832 Chronic kidney disease, stage 3b: Secondary | ICD-10-CM | POA: Diagnosis not present

## 2019-11-25 DIAGNOSIS — R531 Weakness: Secondary | ICD-10-CM | POA: Insufficient documentation

## 2019-11-25 DIAGNOSIS — Y999 Unspecified external cause status: Secondary | ICD-10-CM | POA: Diagnosis not present

## 2019-11-25 DIAGNOSIS — M109 Gout, unspecified: Secondary | ICD-10-CM | POA: Insufficient documentation

## 2019-11-25 DIAGNOSIS — S72114A Nondisplaced fracture of greater trochanter of right femur, initial encounter for closed fracture: Secondary | ICD-10-CM | POA: Diagnosis not present

## 2019-11-25 DIAGNOSIS — S79911A Unspecified injury of right hip, initial encounter: Secondary | ICD-10-CM | POA: Diagnosis present

## 2019-11-25 DIAGNOSIS — W19XXXA Unspecified fall, initial encounter: Secondary | ICD-10-CM | POA: Insufficient documentation

## 2019-11-25 DIAGNOSIS — M25551 Pain in right hip: Secondary | ICD-10-CM | POA: Diagnosis not present

## 2019-11-25 DIAGNOSIS — I4510 Unspecified right bundle-branch block: Secondary | ICD-10-CM | POA: Diagnosis not present

## 2019-11-25 LAB — CBC WITH DIFFERENTIAL/PLATELET
Abs Immature Granulocytes: 0.08 10*3/uL — ABNORMAL HIGH (ref 0.00–0.07)
Basophils Absolute: 0 10*3/uL (ref 0.0–0.1)
Basophils Relative: 0 %
Eosinophils Absolute: 0 10*3/uL (ref 0.0–0.5)
Eosinophils Relative: 0 %
HCT: 37.4 % — ABNORMAL LOW (ref 39.0–52.0)
Hemoglobin: 12.3 g/dL — ABNORMAL LOW (ref 13.0–17.0)
Immature Granulocytes: 1 %
Lymphocytes Relative: 13 %
Lymphs Abs: 2.3 10*3/uL (ref 0.7–4.0)
MCH: 32.3 pg (ref 26.0–34.0)
MCHC: 32.9 g/dL (ref 30.0–36.0)
MCV: 98.2 fL (ref 80.0–100.0)
Monocytes Absolute: 1.6 10*3/uL — ABNORMAL HIGH (ref 0.1–1.0)
Monocytes Relative: 9 %
Neutro Abs: 13.3 10*3/uL — ABNORMAL HIGH (ref 1.7–7.7)
Neutrophils Relative %: 77 %
Platelets: 284 10*3/uL (ref 150–400)
RBC: 3.81 MIL/uL — ABNORMAL LOW (ref 4.22–5.81)
RDW: 14 % (ref 11.5–15.5)
WBC: 17.3 10*3/uL — ABNORMAL HIGH (ref 4.0–10.5)
nRBC: 0 % (ref 0.0–0.2)

## 2019-11-25 LAB — COMPREHENSIVE METABOLIC PANEL
ALT: 23 U/L (ref 0–44)
AST: 28 U/L (ref 15–41)
Albumin: 3.7 g/dL (ref 3.5–5.0)
Alkaline Phosphatase: 201 U/L — ABNORMAL HIGH (ref 38–126)
Anion gap: 11 (ref 5–15)
BUN: 36 mg/dL — ABNORMAL HIGH (ref 8–23)
CO2: 20 mmol/L — ABNORMAL LOW (ref 22–32)
Calcium: 8.9 mg/dL (ref 8.9–10.3)
Chloride: 104 mmol/L (ref 98–111)
Creatinine, Ser: 2.23 mg/dL — ABNORMAL HIGH (ref 0.61–1.24)
GFR calc Af Amer: 30 mL/min — ABNORMAL LOW (ref >60)
GFR calc non Af Amer: 26 mL/min — ABNORMAL LOW (ref >60)
Glucose, Bld: 124 mg/dL — ABNORMAL HIGH (ref 70–99)
Potassium: 4.5 mmol/L (ref 3.5–5.1)
Sodium: 135 mmol/L (ref 135–145)
Total Bilirubin: 0.4 mg/dL (ref 0.3–1.2)
Total Protein: 8.3 g/dL — ABNORMAL HIGH (ref 6.5–8.1)

## 2019-11-25 NOTE — ED Triage Notes (Signed)
Per EMS, Pt was at a shop yesterday and had a fall on his right side. Pt complaining of increased right sided hip/knee pain. Pt also states that he has had increased weakness since.

## 2019-11-26 ENCOUNTER — Emergency Department (HOSPITAL_COMMUNITY): Payer: Medicare HMO

## 2019-11-26 ENCOUNTER — Encounter (HOSPITAL_COMMUNITY): Payer: Self-pay | Admitting: Internal Medicine

## 2019-11-26 ENCOUNTER — Observation Stay (HOSPITAL_COMMUNITY)
Admission: EM | Admit: 2019-11-26 | Discharge: 2019-11-27 | Disposition: A | Discharge status: Home / Self Care | Payer: Medicare HMO | Attending: Family Medicine | Admitting: Family Medicine

## 2019-11-26 DIAGNOSIS — S72114A Nondisplaced fracture of greater trochanter of right femur, initial encounter for closed fracture: Secondary | ICD-10-CM

## 2019-11-26 DIAGNOSIS — M25461 Effusion, right knee: Secondary | ICD-10-CM | POA: Diagnosis not present

## 2019-11-26 DIAGNOSIS — E785 Hyperlipidemia, unspecified: Secondary | ICD-10-CM | POA: Diagnosis present

## 2019-11-26 DIAGNOSIS — S72009A Fracture of unspecified part of neck of unspecified femur, initial encounter for closed fracture: Secondary | ICD-10-CM | POA: Diagnosis present

## 2019-11-26 DIAGNOSIS — N179 Acute kidney failure, unspecified: Secondary | ICD-10-CM

## 2019-11-26 DIAGNOSIS — M25561 Pain in right knee: Secondary | ICD-10-CM | POA: Diagnosis not present

## 2019-11-26 DIAGNOSIS — I10 Essential (primary) hypertension: Secondary | ICD-10-CM | POA: Diagnosis present

## 2019-11-26 DIAGNOSIS — S72001A Fracture of unspecified part of neck of right femur, initial encounter for closed fracture: Secondary | ICD-10-CM | POA: Diagnosis not present

## 2019-11-26 DIAGNOSIS — W19XXXA Unspecified fall, initial encounter: Secondary | ICD-10-CM | POA: Diagnosis not present

## 2019-11-26 DIAGNOSIS — M1711 Unilateral primary osteoarthritis, right knee: Secondary | ICD-10-CM | POA: Diagnosis not present

## 2019-11-26 DIAGNOSIS — S72111A Displaced fracture of greater trochanter of right femur, initial encounter for closed fracture: Secondary | ICD-10-CM | POA: Diagnosis not present

## 2019-11-26 DIAGNOSIS — M25462 Effusion, left knee: Secondary | ICD-10-CM

## 2019-11-26 DIAGNOSIS — M25551 Pain in right hip: Secondary | ICD-10-CM | POA: Diagnosis not present

## 2019-11-26 DIAGNOSIS — N183 Chronic kidney disease, stage 3 unspecified: Secondary | ICD-10-CM | POA: Diagnosis present

## 2019-11-26 DIAGNOSIS — M109 Gout, unspecified: Secondary | ICD-10-CM | POA: Diagnosis present

## 2019-11-26 LAB — TROPONIN I (HIGH SENSITIVITY)
Troponin I (High Sensitivity): 13 ng/L (ref <18)
Troponin I (High Sensitivity): 17 ng/L (ref <18)

## 2019-11-26 LAB — URINALYSIS, ROUTINE W REFLEX MICROSCOPIC
Bilirubin Urine: NEGATIVE
Glucose, UA: NEGATIVE mg/dL
Ketones, ur: NEGATIVE mg/dL
Leukocytes,Ua: NEGATIVE
Nitrite: NEGATIVE
Protein, ur: 100 mg/dL — AB
Specific Gravity, Urine: 1.017 (ref 1.005–1.030)
pH: 5 (ref 5.0–8.0)

## 2019-11-26 LAB — SYNOVIAL CELL COUNT + DIFF, W/ CRYSTALS
Crystals, Fluid: NONE SEEN
Monocyte-Macrophage-Synovial Fluid: 5 % — ABNORMAL LOW (ref 50–90)
Neutrophil, Synovial: 95 % — ABNORMAL HIGH (ref 0–25)
WBC, Synovial: 24570 /mm3 — ABNORMAL HIGH (ref 0–200)

## 2019-11-26 LAB — SARS CORONAVIRUS 2 BY RT PCR (HOSPITAL ORDER, PERFORMED IN ~~LOC~~ HOSPITAL LAB): SARS Coronavirus 2: NEGATIVE

## 2019-11-26 MED ORDER — TAMSULOSIN HCL 0.4 MG PO CAPS
0.4000 mg | ORAL_CAPSULE | Freq: Every day | ORAL | Status: DC
  Administered 2019-11-26 – 2019-11-27 (×2): 0.4 mg via ORAL
  Filled 2019-11-26 (×2): qty 1

## 2019-11-26 MED ORDER — ENSURE ENLIVE PO LIQD
237.0000 mL | Freq: Two times a day (BID) | ORAL | Status: DC
  Administered 2019-11-26 – 2019-11-27 (×2): 237 mL via ORAL

## 2019-11-26 MED ORDER — LIDOCAINE HCL 0.5 % IJ SOLN
10.0000 mL | Freq: Once | INTRAMUSCULAR | Status: DC
  Filled 2019-11-26: qty 10

## 2019-11-26 MED ORDER — SODIUM CHLORIDE 0.9 % IV BOLUS
1000.0000 mL | Freq: Once | INTRAVENOUS | Status: AC
  Administered 2019-11-26: 1000 mL via INTRAVENOUS

## 2019-11-26 MED ORDER — METOPROLOL TARTRATE 5 MG/5ML IV SOLN: 5.0000 mg | Freq: Four times a day (QID) | INTRAVENOUS | Status: DC | PRN

## 2019-11-26 MED ORDER — POLYETHYLENE GLYCOL 3350 17 G PO PACK: 17.0000 g | PACK | Freq: Every day | ORAL | Status: DC | PRN

## 2019-11-26 MED ORDER — HEPARIN SODIUM (PORCINE) 5000 UNIT/ML IJ SOLN
5000.0000 [IU] | Freq: Three times a day (TID) | INTRAMUSCULAR | Status: DC
  Administered 2019-11-26 – 2019-11-27 (×3): 5000 [IU] via SUBCUTANEOUS
  Filled 2019-11-26 (×4): qty 1

## 2019-11-26 MED ORDER — LIDOCAINE HCL (PF) 0.5 % IJ SOLN
10.0000 mL | Freq: Once | INTRAMUSCULAR | Status: AC
  Administered 2019-11-26: 10 mL via INTRADERMAL
  Filled 2019-11-26: qty 50

## 2019-11-26 MED ORDER — MORPHINE SULFATE (PF) 2 MG/ML IV SOLN: 0.5000 mg | INTRAVENOUS | Status: DC | PRN

## 2019-11-26 MED ORDER — ENOXAPARIN SODIUM 40 MG/0.4ML ~~LOC~~ SOLN: 40.0000 mg | SUBCUTANEOUS | Status: DC

## 2019-11-26 MED ORDER — HYDROCODONE-ACETAMINOPHEN 5-325 MG PO TABS
1.0000 | ORAL_TABLET | Freq: Once | ORAL | Status: AC
  Administered 2019-11-26: 1 via ORAL
  Filled 2019-11-26: qty 1

## 2019-11-26 MED ORDER — ALLOPURINOL 100 MG PO TABS
100.0000 mg | ORAL_TABLET | Freq: Two times a day (BID) | ORAL | Status: DC
  Administered 2019-11-26 – 2019-11-27 (×2): 100 mg via ORAL
  Filled 2019-11-26 (×3): qty 1

## 2019-11-26 MED ORDER — BRIMONIDINE TARTRATE 0.2 % OP SOLN
1.0000 [drp] | Freq: Two times a day (BID) | OPHTHALMIC | Status: DC
  Administered 2019-11-26 – 2019-11-27 (×4): 1 [drp] via OPHTHALMIC
  Filled 2019-11-26: qty 5

## 2019-11-26 MED ORDER — METHYLPREDNISOLONE ACETATE 80 MG/ML IJ SUSP
80.0000 mg | Freq: Once | INTRAMUSCULAR | Status: DC
  Filled 2019-11-26: qty 1

## 2019-11-26 MED ORDER — ATORVASTATIN CALCIUM 20 MG PO TABS
20.0000 mg | ORAL_TABLET | Freq: Every day | ORAL | Status: DC
  Administered 2019-11-26 – 2019-11-27 (×2): 20 mg via ORAL
  Filled 2019-11-26 (×2): qty 1

## 2019-11-26 MED ORDER — AMLODIPINE BESYLATE 5 MG PO TABS
5.0000 mg | ORAL_TABLET | Freq: Every day | ORAL | Status: DC
  Administered 2019-11-26 – 2019-11-27 (×2): 5 mg via ORAL
  Filled 2019-11-26 (×2): qty 1

## 2019-11-26 MED ORDER — HYDROCODONE-ACETAMINOPHEN 5-325 MG PO TABS
1.0000 | ORAL_TABLET | Freq: Four times a day (QID) | ORAL | Status: DC | PRN
  Administered 2019-11-26: 1 via ORAL
  Filled 2019-11-26: qty 1

## 2019-11-26 NOTE — Evaluation (Signed)
Physical Therapy Evaluation Patient Details Name: Daniel [name] MRN: [id number] DOB: February 19, 1933 Today's Date: 11/26/2019   History of Present Illness  86 yo male admitted with R greater trochanter fx, R knee effusion. Hx of CKD, Afib, gout, L patella fx, syncope  Clinical Impression  On eval, pt required Min assist for mobility. He walked ~15 feet around his room with a RW. Moderate pain with activity. Reviewed hip precaution (no active ABDuction) and WBAT status. Pt tolerated activity fairly well. Pt does live alone and there is some concern about him being able to safely manage at home alone. Recommendation is for ST SNF at this time, if pt is agreeable. Will continue to follow and progress activity as tolerated.     Follow Up Recommendations SNF (HHPT and 24 hour supervision/assist if pt declines placement.)    Equipment Recommendations  None recommended by PT    Recommendations for Other Services       Precautions / Restrictions Precautions Precautions: Fall Precaution Comments: No active ABduction for 6 weeks per Ortho Restrictions Weight Bearing Restrictions: No RLE Weight Bearing: Weight bearing as tolerated      Mobility  Bed Mobility Overal bed mobility: Needs Assistance Bed Mobility: Supine to Sit;Sit to Supine     Supine to sit: Min guard;HOB elevated     General bed mobility comments: Cues for safety, technique, adherence to precautions. Assist to get LEs back onto bed while adhering to precautions.  Transfers Overall transfer level: Needs assistance Equipment used: Rolling walker (2 wheeled) Transfers: Sit to/from Stand Sit to Stand: Min assist;From elevated surface         General transfer comment: Assist to rise, stabilize, control descent. VCs safety, technique, hand placement. Increased time.  Ambulation/Gait Ambulation/Gait assistance: Min assist Gait Distance (Feet): 15 Feet Assistive device: Rolling walker (2 wheeled) Gait Pattern/deviations:  Step-to pattern;Trunk flexed;Antalgic     General Gait Details: VCs safety, technique, seqeunce. Assist to steady pt throughout short distance.  Stairs            Wheelchair Mobility    Modified Rankin (Stroke Patients Only)       Balance Overall balance assessment: Needs assistance;History of Falls         Standing balance support: Bilateral upper extremity supported Standing balance-Leahy Scale: Poor                               Pertinent Vitals/Pain Pain Assessment: 0-10 Pain Score: 7  Pain Location: R hip, R knee Pain Descriptors / Indicators: Discomfort;Sore;Aching Pain Intervention(s): Limited activity within patient's tolerance;Monitored during session;Repositioned    Home Living Family/patient expects to be discharged to:: Private residence Living Arrangements: Alone   Type of Home: House Home Access: Stairs to enter Entrance Stairs-Rails: Doctor, general practice of Steps: 4 Home Layout: One level Home Equipment: Environmental consultant - 2 wheels;Cane - single point      Prior Function Level of Independence: Independent               Hand Dominance        Extremity/Trunk Assessment   Upper Extremity Assessment Upper Extremity Assessment: Overall WFL for tasks assessed    Lower Extremity Assessment Lower Extremity Assessment: Generalized weakness    Cervical / Trunk Assessment Cervical / Trunk Assessment: Kyphotic  Communication   Communication: HOH  Cognition Arousal/Alertness: Awake/alert Behavior During Therapy: WFL for tasks assessed/performed Overall Cognitive Status: Within Functional Limits for tasks assessed  General Comments      Exercises     Assessment/Plan    PT Assessment Patient needs continued PT services  PT Problem List Decreased strength;Decreased mobility;Decreased activity tolerance;Decreased range of motion;Decreased balance;Decreased  knowledge of use of DME;Pain       PT Treatment Interventions DME instruction;Gait training;Therapeutic activities;Therapeutic exercise;Patient/family education;Balance training;Functional mobility training    PT Goals (Current goals can be found in the Care Plan section)  Acute Rehab PT Goals Patient Stated Goal: regain plof. less pain PT Goal Formulation: With patient Time For Goal Achievement: 12/10/19 Potential to Achieve Goals: Good    Frequency Min 3X/week   Barriers to discharge Decreased caregiver support      Co-evaluation               AM-PAC PT "6 Clicks" Mobility  Outcome Measure Help needed turning from your back to your side while in a flat bed without using bedrails?: A Little Help needed moving from lying on your back to sitting on the side of a flat bed without using bedrails?: A Little Help needed moving to and from a bed to a chair (including a wheelchair)?: A Little Help needed standing up from a chair using your arms (e.g., wheelchair or bedside chair)?: A Little Help needed to walk in hospital room?: A Little Help needed climbing 3-5 steps with a railing? : A Lot 6 Click Score: 17    End of Session Equipment Utilized During Treatment: Gait belt Activity Tolerance: Patient tolerated treatment well Patient left: with call bell/phone within reach;with bed alarm set   PT Visit Diagnosis: History of falling (Z91.81);Muscle weakness (generalized) (M62.81);Difficulty in walking, not elsewhere classified (R26.2);Pain Pain - Right/Left: Right Pain - part of body: Knee;Hip    Time: 1445-1501 PT Time Calculation (min) (ACUTE ONLY): 16 min   Charges:   PT Evaluation $PT Eval Low Complexity: 1 Low            Faye Ramsay, PT Acute Rehabilitation  Office: [phone] Pager: [phone]

## 2019-11-26 NOTE — NC FL2 (Signed)
[place] MEDICAID FL2 LEVEL OF CARE SCREENING TOOL     IDENTIFICATION  Patient Name: Daniel [name] Birthdate: Nov 22, 1932 Sex: male Admission Date (Current Location): 11/26/2019  [facility] and [place] Number:  Producer, television/film/video and Address:  [facility],  [address] [place], [place] [address]      Provider Number: [id number]  Attending Physician Name and Address:  Jae Dire, MD  Relative Name and Phone Number:  Elease Hashimoto Daughter   [phone]    Current Level of Care: Hospital Recommended Level of Care: Skilled Nursing Facility Prior Approval Number:    Date Approved/Denied:   PASRR Number: [id number]  Discharge Plan: SNF    Current Diagnoses: Patient Active Problem List   Diagnosis Date Noted  . Hip fracture (HCC) 11/26/2019  . AKI (acute kidney injury) (HCC) 11/26/2019  . Thrombocytopenia (HCC) 05/24/2018  . Acute ITP (HCC)   . Syncope 11/20/2017  . Leukocytosis 08/04/2017  . Caregiver burden 04/06/2017  . Bleeding 03/29/2014  . BPH (benign prostatic hyperplasia) 10/31/2013  . Encounter for therapeutic drug monitoring 07/05/2013  . PULMONARY NODULE, RIGHT UPPER LOBE 11/24/2007  . Dyslipidemia 08/24/2006  . GOUT 08/24/2006  . Essential hypertension 08/24/2006  . CKD (chronic kidney disease), stage III 08/24/2006    Orientation RESPIRATION BLADDER Height & Weight     Self, Time, Situation, Place  Normal Continent Weight: 120 lb (54.4 kg) Height:  5\' 6"  (167.6 cm)  BEHAVIORAL SYMPTOMS/MOOD NEUROLOGICAL BOWEL NUTRITION STATUS      Continent Diet (Regular Diet)  AMBULATORY STATUS COMMUNICATION OF NEEDS Skin   Extensive Assist Verbally Normal                       Personal Care Assistance Level of Assistance  Bathing, Feeding, Dressing Bathing Assistance: Limited assistance Feeding assistance: Independent Dressing Assistance: Limited assistance     Functional Limitations Info  Sight, Hearing, Speech Sight Info: Impaired  (Wears Glasses) Hearing Info: Adequate Speech Info: Adequate    SPECIAL CARE FACTORS FREQUENCY  PT (By licensed PT), OT (By licensed OT)     PT Frequency: 5x/week OT Frequency: 5x/week            Contractures Contractures Info: Not present    Additional Factors Info  Code Status, Allergies, Psychotropic Code Status Info: Fullcode Allergies Info: Allergies: Ciprofloxacin, Robaxin Methocarbamol, Iron           Current Medications (11/26/2019):  This is the current hospital active medication list Current Facility-Administered Medications  Medication Dose Route Frequency Provider Last Rate Last Admin  . allopurinol (ZYLOPRIM) tablet 100 mg  100 mg Oral BID 01/26/2020, MD      . amLODipine (NORVASC) tablet 5 mg  5 mg Oral Daily Jae Dire, MD   5 mg at 11/26/19 1041  . atorvastatin (LIPITOR) tablet 20 mg  20 mg Oral Daily 01/26/20, MD   20 mg at 11/26/19 1041  . brimonidine (ALPHAGAN) 0.2 % ophthalmic solution 1 drop  1 drop Both Eyes BID 01/26/20, MD   1 drop at 11/26/19 1022  . feeding supplement (ENSURE ENLIVE) (ENSURE ENLIVE) liquid 237 mL  237 mL Oral BID BM 01/26/20, MD   237 mL at 11/26/19 1042  . heparin injection 5,000 Units  5,000 Units Subcutaneous Q8H 01/26/20, MD      . HYDROcodone-acetaminophen (NORCO/VICODIN) 5-325 MG per tablet 1-2 tablet  1-2 tablet Oral Q6H PRN Jae Dire  E, MD   1 tablet at 11/26/19 1041  . methylPREDNISolone acetate (DEPO-MEDROL) injection 80 mg  80 mg Intra-articular Once Babish, Matthew, PA-C      . metoprolol tartrate (LOPRESSOR) injection 5 mg  5 mg Intravenous Q6H PRN Jae Dire, MD      . morphine 2 MG/ML injection 0.5 mg  0.5 mg Intravenous Q2H PRN Jae Dire, MD      . polyethylene glycol (MIRALAX / GLYCOLAX) packet 17 g  17 g Oral Daily PRN Jae Dire, MD      . tamsulosin [facility]) capsule 0.4 mg  0.4 mg Oral Daily Jae Dire, MD   0.4 mg at 11/26/19 1041     Discharge  Medications: Please see discharge summary for a list of discharge medications.  Relevant Imaging Results:  Relevant Lab Results:   Additional Information ssn: [id number]  Clearance Coots, LCSW
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
## 2019-11-26 NOTE — Progress Notes (Signed)
       After reviewing he initial results with Dr. Charlann Boxer.  24,000 WBC would be more of an inflammatory response and not really indication of infection in a native knee. No need to for further treatment of the knee at this time. He can follow up with regards to the right knee OA in the out patient setting.  May use toradol if able with his kidney function for inflammation / pain if needed.  Otherwise can follow up with Dr. Linna Caprice in the office in 2 weeks regarding the right hip fracture. Again he can be WBAT with walker, no active abduction for 6 weeks.     Assessment/Plan: 1.  Right hip pain 2.  Right hip greater trochanter fracture 3.  Right knee pain 4.  Right knee effusion   - WBAT with walker, no active abduction for 6 weeks - Follow up in the clinic in 2 with with Dr. Linna Caprice at [facility]       Lanney Gins PA-C  [facility]  [facility] [address], [address], [place], [place] [address] Phone: [phone] [email or web address] Facebook  [employer]
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
## 2019-11-26 NOTE — Progress Notes (Signed)
Consult received for R greater trochanter fx. Imaging reviewed. WBAT with walker, no active abduction for 6 weeks. Full consult to follow.

## 2019-11-26 NOTE — Progress Notes (Signed)
Welcome kit and handbook has been given to patient.

## 2019-11-26 NOTE — TOC Initial Note (Addendum)
Transition of Care [facility]) - Initial/Assessment Note    Patient Details  Name: Daniel [name] MRN: [id number] Date of Birth: 12-Oct-1932  Transition of Care [facility]) CM/SW Contact:    Lia Hopping, [place] Phone Number: 11/26/2019, 11:06 AM  Clinical Narrative:                 CSW met with the patient at bedside to discuss rehab at [facility] before returning home. Patient lives alone and does not have immediate family in the area. The patient spouse is in a memory care facility and daughter lives in [place]. Patient agreeable to SNF rehab. Patient walked independently and was independent with his ADL's prior to his fall. Patient still drives. Patient has house keeping services.  CSW explain the SNF and will follow up with bed offers. Patient gave CSW permission to provide updates to his daughter Jerilynn Mages. CSW informed daughter about current plan of care. CSW answered daughters questions and addressed concerns about patient rehab stay. CSW will e-mail the patient daughter the SNF list. Patient reports he has not been vaccinated and is not interested in receiving the vaccination at this time.  FL2 competed.   Expected Discharge Plan: Skilled Nursing Facility Barriers to Discharge: Continued Medical Work up, Ship broker   Patient Goals and CMS Choice Patient states their goals for this hospitalization and ongoing recovery are:: Go to rehab for therapy and then go home CMS Medicare.gov Compare Post Acute Care list provided to:: Patient Choice offered to / list presented to : Patient, Adult Children  Expected Discharge Plan and Services Expected Discharge Plan: [place] In-house Referral: Clinical Social Work   Post Acute Care Choice: Nursing Home Living arrangements for the past 2 months: [place]                 DME Arranged: N/A DME Agency: NA       HH Arranged: NA [place] Agency: NA        Prior Living Arrangements/Services Living arrangements for  the past 2 months: [place] with:: Self Patient language and need for interpreter reviewed:: Yes Do you feel safe going back to the place where you live?: Yes      Need for Family Participation in Patient Care: Yes (Comment) Care giver support system in place?: No (comment) (Family lives out of town) Current home services: DME, Housekeeping Criminal Activity/Legal Involvement Pertinent to Current Situation/Hospitalization: No - Comment as needed  Activities of Daily Living Home Assistive Devices/Equipment: Eyeglasses, Radio producer (specify quad or straight) ADL Screening (condition at time of admission) Patient's cognitive ability adequate to safely complete daily activities?: Yes Is the patient deaf or have difficulty hearing?: No Does the patient have difficulty seeing, even when wearing glasses/contacts?: No Does the patient have difficulty concentrating, remembering, or making decisions?: No Patient able to express need for assistance with ADLs?: No Does the patient have difficulty dressing or bathing?: Yes Independently performs ADLs?: Yes (appropriate for developmental age) Does the patient have difficulty walking or climbing stairs?: No Weakness of Legs: Both Weakness of Arms/Hands: Both  Permission Sought/Granted Permission sought to share information with : Facility Sport and exercise psychologist, Case Manager, Family Supports Permission granted to share information with : Yes, Verbal Permission Granted  Share Information with NAME: Judee Clara  Permission granted to share info w AGENCY: SNF's in the area  Permission granted to share info w Relationship: Daughter  Permission granted to share info w Contact Information: [phone]  Emotional Assessment Appearance::  Appears stated age Attitude/Demeanor/Rapport: Engaged Affect (typically observed): Accepting, Calm Orientation: : Oriented to Self, Oriented to Place, Oriented to  Time, Oriented to Situation Alcohol /  Substance Use: Not Applicable Psych Involvement: No (comment)  Admission diagnosis:  Hip fracture ([place]) [S72.009A] Patient Active Problem List   Diagnosis Date Noted  . Hip fracture ([place]) 11/26/2019  . AKI (acute kidney injury) ([place]) 11/26/2019  . Thrombocytopenia ([place]) 05/24/2018  . Acute ITP ([place])   . Syncope 11/20/2017  . Leukocytosis 08/04/2017  . Caregiver burden 04/06/2017  . Bleeding 03/29/2014  . BPH (benign prostatic hyperplasia) 10/31/2013  . Encounter for therapeutic drug monitoring 07/05/2013  . PULMONARY NODULE, RIGHT UPPER LOBE 11/24/2007  . Dyslipidemia 08/24/2006  . GOUT 08/24/2006  . Essential hypertension 08/24/2006  . CKD (chronic kidney disease), stage III 08/24/2006   PCP:  Marin Olp, MD Pharmacy:   [facility] [address], [place] - [address] [place] [address] Phone: [phone] Fax: [phone]  [facility] [place], [place] [address] Phone: [phone] Fax: [phone]     Social Determinants of Health (SDOH) Interventions    Readmission Risk Interventions No flowsheet data found.
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
## 2019-11-27 ENCOUNTER — Observation Stay (HOSPITAL_COMMUNITY): Payer: Medicare HMO

## 2019-11-27 DIAGNOSIS — M25462 Effusion, left knee: Secondary | ICD-10-CM | POA: Diagnosis not present

## 2019-11-27 DIAGNOSIS — E785 Hyperlipidemia, unspecified: Secondary | ICD-10-CM | POA: Diagnosis not present

## 2019-11-27 DIAGNOSIS — M25562 Pain in left knee: Secondary | ICD-10-CM | POA: Diagnosis not present

## 2019-11-27 DIAGNOSIS — W19XXXA Unspecified fall, initial encounter: Secondary | ICD-10-CM | POA: Diagnosis not present

## 2019-11-27 DIAGNOSIS — I10 Essential (primary) hypertension: Secondary | ICD-10-CM

## 2019-11-27 DIAGNOSIS — N1832 Chronic kidney disease, stage 3b: Secondary | ICD-10-CM | POA: Diagnosis not present

## 2019-11-27 DIAGNOSIS — M25461 Effusion, right knee: Secondary | ICD-10-CM | POA: Diagnosis not present

## 2019-11-27 DIAGNOSIS — Z4789 Encounter for other orthopedic aftercare: Secondary | ICD-10-CM | POA: Diagnosis not present

## 2019-11-27 DIAGNOSIS — Z79899 Other long term (current) drug therapy: Secondary | ICD-10-CM | POA: Diagnosis not present

## 2019-11-27 DIAGNOSIS — Z7401 Bed confinement status: Secondary | ICD-10-CM | POA: Diagnosis not present

## 2019-11-27 DIAGNOSIS — N179 Acute kidney failure, unspecified: Secondary | ICD-10-CM | POA: Diagnosis not present

## 2019-11-27 DIAGNOSIS — S72111D Displaced fracture of greater trochanter of right femur, subsequent encounter for closed fracture with routine healing: Secondary | ICD-10-CM | POA: Diagnosis not present

## 2019-11-27 DIAGNOSIS — M25551 Pain in right hip: Secondary | ICD-10-CM | POA: Diagnosis not present

## 2019-11-27 DIAGNOSIS — M1180 Other specified crystal arthropathies, unspecified site: Secondary | ICD-10-CM | POA: Diagnosis not present

## 2019-11-27 DIAGNOSIS — M25561 Pain in right knee: Secondary | ICD-10-CM | POA: Diagnosis not present

## 2019-11-27 DIAGNOSIS — S72114A Nondisplaced fracture of greater trochanter of right femur, initial encounter for closed fracture: Secondary | ICD-10-CM | POA: Diagnosis not present

## 2019-11-27 DIAGNOSIS — I129 Hypertensive chronic kidney disease with stage 1 through stage 4 chronic kidney disease, or unspecified chronic kidney disease: Secondary | ICD-10-CM | POA: Diagnosis not present

## 2019-11-27 DIAGNOSIS — R2681 Unsteadiness on feet: Secondary | ICD-10-CM | POA: Diagnosis not present

## 2019-11-27 DIAGNOSIS — Z20822 Contact with and (suspected) exposure to covid-19: Secondary | ICD-10-CM | POA: Diagnosis not present

## 2019-11-27 DIAGNOSIS — M2550 Pain in unspecified joint: Secondary | ICD-10-CM | POA: Diagnosis not present

## 2019-11-27 DIAGNOSIS — S72001A Fracture of unspecified part of neck of right femur, initial encounter for closed fracture: Secondary | ICD-10-CM | POA: Diagnosis not present

## 2019-11-27 DIAGNOSIS — Z87891 Personal history of nicotine dependence: Secondary | ICD-10-CM | POA: Diagnosis not present

## 2019-11-27 DIAGNOSIS — N183 Chronic kidney disease, stage 3 unspecified: Secondary | ICD-10-CM | POA: Diagnosis not present

## 2019-11-27 DIAGNOSIS — F418 Other specified anxiety disorders: Secondary | ICD-10-CM | POA: Diagnosis not present

## 2019-11-27 DIAGNOSIS — M1712 Unilateral primary osteoarthritis, left knee: Secondary | ICD-10-CM | POA: Diagnosis not present

## 2019-11-27 DIAGNOSIS — I4891 Unspecified atrial fibrillation: Secondary | ICD-10-CM | POA: Diagnosis not present

## 2019-11-27 DIAGNOSIS — M6281 Muscle weakness (generalized): Secondary | ICD-10-CM | POA: Diagnosis not present

## 2019-11-27 DIAGNOSIS — M109 Gout, unspecified: Secondary | ICD-10-CM

## 2019-11-27 LAB — BASIC METABOLIC PANEL
Anion gap: 8 (ref 5–15)
BUN: 37 mg/dL — ABNORMAL HIGH (ref 8–23)
CO2: 20 mmol/L — ABNORMAL LOW (ref 22–32)
Calcium: 8.4 mg/dL — ABNORMAL LOW (ref 8.9–10.3)
Chloride: 104 mmol/L (ref 98–111)
Creatinine, Ser: 1.97 mg/dL — ABNORMAL HIGH (ref 0.61–1.24)
GFR calc Af Amer: 35 mL/min — ABNORMAL LOW (ref >60)
GFR calc non Af Amer: 30 mL/min — ABNORMAL LOW (ref >60)
Glucose, Bld: 120 mg/dL — ABNORMAL HIGH (ref 70–99)
Potassium: 4.2 mmol/L (ref 3.5–5.1)
Sodium: 132 mmol/L — ABNORMAL LOW (ref 135–145)

## 2019-11-27 LAB — SYNOVIAL CELL COUNT + DIFF, W/ CRYSTALS
Crystals, Fluid: NONE SEEN
Lymphocytes-Synovial Fld: 1 % (ref 0–20)
Monocyte-Macrophage-Synovial Fluid: 11 % — ABNORMAL LOW (ref 50–90)
Neutrophil, Synovial: 88 % — ABNORMAL HIGH (ref 0–25)
WBC, Synovial: 30500 /mm3 — ABNORMAL HIGH (ref 0–200)

## 2019-11-27 LAB — CBC
HCT: 33.5 % — ABNORMAL LOW (ref 39.0–52.0)
Hemoglobin: 10.8 g/dL — ABNORMAL LOW (ref 13.0–17.0)
MCH: 32 pg (ref 26.0–34.0)
MCHC: 32.2 g/dL (ref 30.0–36.0)
MCV: 99.4 fL (ref 80.0–100.0)
Platelets: 242 10*3/uL (ref 150–400)
RBC: 3.37 MIL/uL — ABNORMAL LOW (ref 4.22–5.81)
RDW: 14.1 % (ref 11.5–15.5)
WBC: 14.5 10*3/uL — ABNORMAL HIGH (ref 4.0–10.5)
nRBC: 0 % (ref 0.0–0.2)

## 2019-11-27 NOTE — Plan of Care (Signed)
  Problem: Activity: Goal: Risk for activity intolerance will decrease Outcome: Progressing   Problem: Pain Managment: Goal: General experience of comfort will improve Outcome: Progressing   

## 2019-11-27 NOTE — Progress Notes (Signed)
Pt left the unit by PTAR at 23:35. All belongings sent with patient.

## 2019-11-27 NOTE — Progress Notes (Signed)
Called again to Daniel [name] to give a report, no answer.

## 2019-11-27 NOTE — Progress Notes (Signed)
Report given to [name], Nurse diane.

## 2019-11-27 NOTE — Discharge Summary (Signed)
Physician Discharge Summary  Daniel [name] MBT:[id number] DOB: [birth year] DOA: 11/26/2019  PCP: Marin Olp, MD  Admit date: 11/26/2019 Discharge date: 11/27/2019  Admitted From: Home Disposition: SNF   Recommendations for Outpatient Follow-up:  1. Follow up with PCP in 1-2 weeks 2. Continue PT/OT, WBAT RLE, no active RLE abduction for 6 weeks, and orthopedics follow up.   Home Health: N/A Equipment/Devices: Per SNF Discharge Condition: Stable CODE STATUS: Full Diet recommendation: Heart healthy  Brief/Interim Summary: Daniel [name] is an 86 year old male with past medical history of hypertension, gout, arthritis, stroke, CKD 3b, and leukocytosis who fell at a restaurant onto his right side injuring his hip and knee on 9/3.  Not sure what caused the fall but denied tripping, dizziness or loss of consciousness and did not hit his head.  He was able to walk and drive himself home but had severe pain in right hip and right knee and continued to have increasing pain, called EMS and was brought to the ED. States he was in bed all day Sunday because he could not get out of bed.  Currently, he states that he is having significant pain on his right leg, denies loss of feeling in his foot.  States he typically ambulates independently but has since had to use a cane.  Wife lives in Memory care with dementia and he lives alone. He denies any other complaints  In the ED: T 98.8, P 88, RR 16, BP 121/99 SPO2 96% on RA.  Notable labs: Glucose 124, BUN 36, creatinine 2.23 (previous 1.9 in 2020).  Alk phos 201, WBC 17.3, Hb 12.3, UA negative.  COVID-19 negative.  CT head with remote left cerebellar and left thalamic infarcts but otherwise unremarkable.  Patient refused CT C-spine.  CT right hip: Acute fracture through greater trochanter proximal right femur.  CT right knee with small to moderate joint effusion and severe tricompartmental degenerative changes without acute fracture.  EKG at baseline.  Orthopedics was consulted, recommending nonoperative management with weight-bearing as tolerated and no active abduction x6 weeks. Rehabilitation at SNF is recommended and being pursued.   During admission, the patient's knees began to bother him more. Orthopedics performed arthrocentesis of the right knee 9/6 showing WBC 24k with no organisms on gram stain, felt to be consistent with acute gout. Orthopedics was reconsulted with onset of left knee pain the next day and is planning on tapping that knee and possibly injecting steroids prior to transfer to SNF in order to relieve pain and facilitate rehabilitation.  Discharge Diagnoses:  Active Problems:   Dyslipidemia   GOUT   Essential hypertension   CKD (chronic kidney disease), stage III   Hip fracture (HCC)   AKI (acute kidney injury) ([place])  1. Acute proximal right femur fracture s/p mechanical fall from standing height: a. Afebrile with elevated WBC, though has leukocytosis at baseline. b. Ortho consulted, Dr. Lyla Glassing recommends weightbearing as tolerated with walker and no active abduction for 6 weeks.  c. Planning to pursue SNF rehabilitation. 2. AKI on CKD 3b a. Baseline creatinine 1.6-1.9 in 2020. Was 2.2 on admission, improved to baseline after admission. 3. Gout with acute flare in bilateral knees: a. Continue allopurinol b. Avoid NSAIDs with CKD. Injecting knees with steroids prior to discharge. If pain continues, may need further evaluation. 4. Hypertension a. Continue norvasc 5. History of stroke without residual deficit, stable 6. BPH: Continue flomax 7. Leukocytosis: Improved, likely slightly elevated due to stress, but has chronically elevated baseline. Afebrile.  8. HLD: Continue statin.  Discharge Instructions  Allergies as of 11/27/2019      Reactions   Ciprofloxacin Other (See Comments)   Tendon pain - knee bilateral   Robaxin [methocarbamol] Swelling   Iron Rash      Medication List    TAKE these  medications   allopurinol 100 MG tablet Commonly known as: ZYLOPRIM Take 1 tablet (100 mg total) by mouth 2 (two) times daily.   amLODipine 5 MG tablet Commonly known as: NORVASC Take 1 tablet (5 mg total) by mouth daily. NEEDS CPE FOR MORE REFILLS   atorvastatin 20 MG tablet Commonly known as: LIPITOR TAKE ONE TABLET BY MOUTH DAILY AT 6P.M. (THIS REPLACES SIMVASTATIN)   brimonidine 0.2 % ophthalmic solution Commonly known as: ALPHAGAN Place 1 drop into both eyes 2 (two) times daily.   folic acid 1 MG tablet Commonly known as: FOLVITE Take 1 tablet (1 mg total) by mouth daily. What changed: when to take this   tamsulosin 0.4 MG Caps capsule Commonly known as: FLOMAX Take 1 capsule (0.4 mg total) by mouth daily.       Contact information for follow-up providers    Marin Olp, MD Follow up.   Specialty: Family Medicine Contact information: [place] [address] [phone]        Rod Can, MD Follow up.   Specialty: Orthopedic Surgery Contact information: [address] [place] [address] [phone]            Contact information for after-discharge care    [facility] .   Service: Skilled Nursing Contact information: [address] [place] [phone]                 Allergies  Allergen Reactions  . Ciprofloxacin Other (See Comments)    Tendon pain - knee bilateral  . Robaxin [Methocarbamol] Swelling  . Iron Rash    Consultations:  Orthopedics  Procedures/Studies: DG Chest 2 View  Result Date: 11/25/2019 CLINICAL DATA:  Fall, chest injury EXAM: CHEST - 2 VIEW COMPARISON:  03/08/2018 FINDINGS: Lungs are well expanded, symmetric, and clear. No pneumothorax or pleural effusion. Cardiac size within normal limits. Pulmonary vascularity is normal. Osseous structures are age-appropriate. No acute bone abnormality. IMPRESSION: No active  cardiopulmonary disease. Electronically Signed   By: Fidela Salisbury MD   On: 11/25/2019 18:36   CT Head Wo Contrast  Result Date: 11/25/2019 CLINICAL DATA:  Fall, head injury, progressive weakness EXAM: CT HEAD WITHOUT CONTRAST TECHNIQUE: Contiguous axial images were obtained from the base of the skull through the vertex without intravenous contrast. COMPARISON:  03/08/2018 FINDINGS: Brain: Normal anatomic configuration. Mild parenchymal volume loss is commensurate with the patient's age. Mild periventricular white matter changes are present likely reflecting the sequela of small vessel ischemia. Remote infarct noted within the left cerebellar hemisphere and left thalamus. No abnormal intra or extra-axial mass lesion or fluid collection. No abnormal mass effect or midline shift. No evidence of acute intracranial hemorrhage or infarct. Ventricular size is normal. Cerebellum unremarkable. Vascular: Unremarkable Skull: Intact Sinuses/Orbits: Mild mucosal thickening within the right maxillary sinus. Remaining paranasal sinuses are clear. Orbits are unremarkable. Other: Mastoid air cells and middle ear cavities are clear. Tiny radiopaque density overlying the right frontal bone may represent a retained radiopaque foreign body, but is stable since prior examination. IMPRESSION: 1. No acute intracranial abnormality. 2. Remote left cerebellar and left thalamic infarcts. 3.  Mild right maxillary sinus disease. Electronically Signed   By: Fidela Salisbury MD   On: 11/25/2019 18:54   CT Knee Right Wo Contrast  Result Date: 11/26/2019 CLINICAL DATA:  Fall.  Right knee pain. EXAM: CT OF THE right KNEE WITHOUT CONTRAST TECHNIQUE: Multidetector CT imaging of the right knee was performed according to the standard protocol. Multiplanar CT image reconstructions were also generated. COMPARISON:  Plain films today FINDINGS: Severe tricompartment degenerative changes within the right knee. Extensive subchondral cyst formation. Small  to moderate joint effusion. No fracture, subluxation or dislocation. IMPRESSION: No acute bony abnormality. Severe tricompartment degenerative changes. Small to moderate joint effusion. Electronically Signed   By: Rolm Baptise M.D.   On: 11/26/2019 02:45   CT Hip Right Wo Contrast  Result Date: 11/26/2019 CLINICAL DATA:  Fall.  Suspected right hip fracture. EXAM: CT OF THE RIGHT HIP WITHOUT CONTRAST TECHNIQUE: Multidetector CT imaging of the right hip was performed according to the standard protocol. Multiplanar CT image reconstructions were also generated. COMPARISON:  Plain film earlier today FINDINGS: Fracture noted through the greater trochanter of the proximal right femur. No visible femoral neck extension. No subluxation or dislocation. IMPRESSION: Fracture through the greater trochanter of the proximal right femur. Electronically Signed   By: Rolm Baptise M.D.   On: 11/26/2019 02:43   DG Knee Complete 4 Views Right  Result Date: 11/25/2019 CLINICAL DATA:  Fall, right knee pain EXAM: RIGHT KNEE - COMPLETE 4+ VIEW COMPARISON:  02/24/2013 FINDINGS: Four view radiograph of the right knee demonstrates severe tricompartmental degenerative arthritis, progressive since prior examination, with mild overall varus angulation, unchanged. There is no acute fracture or dislocation. There is chondrocalcinosis involving the medial and lateral menisci. There is progressive enlargement of a periarticular circumscribed lytic lesions involving the proximal tibiofibular articulation. These may represent synovial cysts, particularly given the relatively slow interval enlargement since prior examination. Vascular calcifications are seen. Trace right knee effusion. IMPRESSION: 1. No acute fracture. 2. Severe tricompartmental degenerative arthritis. 3. Progressive enlargement of a periarticular circumscribed lytic lesions involving the proximal tibiofibular articulation. These likely represent synovial cysts, but could be  confirmed with MRI examination. Electronically Signed   By: Fidela Salisbury MD   On: 11/25/2019 18:47   DG Knee Left Port  Result Date: 11/27/2019 CLINICAL DATA:  Left knee effusion. EXAM: PORTABLE LEFT KNEE - 1-2 VIEW COMPARISON:  July 05, 2014. FINDINGS: There is deformity involving the patella consistent with old healed patellar fracture. Severe degenerative changes seen involving the medial joint space. No definite joint effusion is noted. No definite acute fracture is noted. Soft tissues are unremarkable. IMPRESSION: Old healed patellar fracture. Severe degenerative joint disease is noted medially. No acute abnormality seen in the left knee. Electronically Signed   By: Marijo Conception M.D.   On: 11/27/2019 14:55   DG Hip Unilat W or Wo Pelvis 2-3 Views Right  Result Date: 11/25/2019 CLINICAL DATA:  Fall, right hip pain EXAM: DG HIP (WITH OR WITHOUT PELVIS) 2-3V RIGHT COMPARISON:  None. FINDINGS: Single view radiograph of the pelvis and two view radiograph of the right hip demonstrates normal alignment. No fracture or dislocation. Mild bilateral hip degenerative arthritis with asymmetric joint space narrowing. Minimal vascular calcifications within the medial thighs. IMPRESSION: Mild bilateral hip degenerative arthritis. No acute findings. Electronically Signed   By: Fidela Salisbury MD   On: 11/25/2019 18:39   Subjective: Right knee feels much better after arthrocentesis yesterday, said you couldn't have touched the knee  yesterday due to severity of pain. Right hip pain is minimal at rest. Beginning later in the day yesterday and into this morning the left knee is beginning to swell with the same exquisite tenderness to even a sheet being pulled over it. He denies fever or feeling systemically unwell/fatigue, poor appetite, etc.   Discharge Exam: Vitals:   11/27/19 0626 11/27/19 1336  BP: 134/66 (!) 118/59  Pulse: 95 89  Resp: 18 18  Temp: 100.1 F (37.8 C) 98.2 F (36.8 C)  SpO2: 92% 96%    General: Pleasant elderly, WDWN male Cardiovascular: RRR, S1/S2 +, no rubs, no gallops Respiratory: CTA bilaterally, no wheezing, no rhonchi Abdominal: Soft, NT, ND, bowel sounds + Extremities: Right knee without appreciable tenderness to palpation, no significant erythema or effusion. Left knee with significant tenderness, no warmth compared to right, and no erythema. Distally NVI bilaterally.   Labs: BNP (last 3 results) No results for input(s): BNP in the last 8760 hours. Basic Metabolic Panel: Recent Labs  Lab 11/25/19 2143 11/27/19 0316  NA 135 132*  K 4.5 4.2  CL 104 104  CO2 20* 20*  GLUCOSE 124* 120*  BUN 36* 37*  CREATININE 2.23* 1.97*  CALCIUM 8.9 8.4*   Liver Function Tests: Recent Labs  Lab 11/25/19 2143  AST 28  ALT 23  ALKPHOS 201*  BILITOT 0.4  PROT 8.3*  ALBUMIN 3.7   No results for input(s): LIPASE, AMYLASE in the last 168 hours. No results for input(s): AMMONIA in the last 168 hours. CBC: Recent Labs  Lab 11/25/19 2143 11/27/19 0316  WBC 17.3* 14.5*  NEUTROABS 13.3*  --   HGB 12.3* 10.8*  HCT 37.4* 33.5*  MCV 98.2 99.4  PLT 284 242   Cardiac Enzymes: No results for input(s): CKTOTAL, CKMB, CKMBINDEX, TROPONINI in the last 168 hours. BNP: Invalid input(s): POCBNP CBG: No results for input(s): GLUCAP in the last 168 hours. D-Dimer No results for input(s): DDIMER in the last 72 hours. Hgb A1c No results for input(s): HGBA1C in the last 72 hours. Lipid Profile No results for input(s): CHOL, HDL, LDLCALC, TRIG, CHOLHDL, LDLDIRECT in the last 72 hours. Thyroid function studies No results for input(s): TSH, T4TOTAL, T3FREE, THYROIDAB in the last 72 hours.  Invalid input(s): FREET3 Anemia work up No results for input(s): VITAMINB12, FOLATE, FERRITIN, TIBC, IRON, RETICCTPCT in the last 72 hours. Urinalysis    Component Value Date/Time   COLORURINE YELLOW 11/26/2019 0141   APPEARANCEUR CLEAR 11/26/2019 0141   LABSPEC 1.017 11/26/2019  0141   PHURINE 5.0 11/26/2019 0141   GLUCOSEU NEGATIVE 11/26/2019 0141   HGBUR SMALL (A) 11/26/2019 0141   BILIRUBINUR NEGATIVE 11/26/2019 0141   BILIRUBINUR Negative 07/06/2017 1223   KETONESUR NEGATIVE 11/26/2019 0141   PROTEINUR 100 (A) 11/26/2019 0141   UROBILINOGEN 0.2 07/06/2017 1223   UROBILINOGEN 0.2 03/28/2014 2004   NITRITE NEGATIVE 11/26/2019 0141   LEUKOCYTESUR NEGATIVE 11/26/2019 0141    Microbiology Recent Results (from the past 240 hour(s))  SARS Coronavirus 2 by RT PCR (hospital order, performed in [place] hospital lab) Nasopharyngeal Nasopharyngeal Swab     Status: None   Collection Time: 11/26/19  4:43 AM   Specimen: Nasopharyngeal Swab  Result Value Ref Range Status   SARS Coronavirus 2 NEGATIVE NEGATIVE Final    Comment: (NOTE) SARS-CoV-2 target nucleic acids are NOT DETECTED.  The SARS-CoV-2 RNA is generally detectable in upper and lower respiratory specimens during the acute phase of infection. The lowest concentration of SARS-CoV-2 viral  copies this assay can detect is 250 copies / mL. A negative result does not preclude SARS-CoV-2 infection and should not be used as the sole basis for treatment or other patient management decisions.  A negative result may occur with improper specimen collection / handling, submission of specimen other than nasopharyngeal swab, presence of viral mutation(s) within the areas targeted by this assay, and inadequate number of viral copies (<250 copies / mL). A negative result must be combined with clinical observations, patient history, and epidemiological information.  Fact Sheet for Patients:   StrictlyIdeas.no  Fact Sheet for Healthcare Providers: BankingDealers.co.za  This test is not yet approved or  cleared by the [place] FDA and has been authorized for detection and/or diagnosis of SARS-CoV-2 by FDA under an Emergency Use Authorization (EUA).  This EUA will  remain in effect (meaning this test can be used) for the duration of the COVID-19 declaration under Section 564(b)(1) of the Act, 21 U.S.C. section 360bbb-3(b)(1), unless the authorization is terminated or revoked sooner.  Performed at [facility], [place] [address], [place], [place] [address]   Aerobic/Anaerobic Culture (surgical/deep wound)     Status: None (Preliminary result)   Collection Time: 11/26/19 10:24 AM   Specimen: KNEE; Synovial Fluid  Result Value Ref Range Status   Specimen Description   Final    KNEE RIGHT Performed at [facility] [address], [place], [place] [address]    Special Requests   Final    NONE Performed at [facility], [place] [address], [place], [place] [address]    Gram Stain   Final    ABUNDANT WBC PRESENT, PREDOMINANTLY PMN NO ORGANISMS SEEN Gram Stain Report Called to,Read Back By and Verified With: TAYLOR,J. RN _0  ON 9.6.2021 BY [facility] Performed at [facility], [place] [address], [place], [place] [address]    Culture   Final    NO GROWTH < 24 HOURS Performed at [facility] [address], [place],  [address]    Report Status PENDING  Incomplete    Time coordinating discharge: Approximately 40 minutes  Patrecia Pour, MD  [facility] 11/27/2019, 2:59 PM

## 2019-11-27 NOTE — TOC Transition Note (Addendum)
Transition of Care [facility]) - CM/SW Discharge Note   Patient Details  Name: Daniel [name] MRN: [id number] Date of Birth: 07-Oct-1932  [facility]) CM/SW Contact:  Daniel Coots, LCSW Phone Number: 11/27/2019, 11:35 AM   Clinical Narrative:    [name].  Facility completing admission paperwork with the daughter Daniel [name].   CSW confirm SNF will accept the patient after 6pm.  Patient room:212B Nurse call report to: [phone] PTAR given the patient demographics, nurse call PTAR when the patient is ready.      Final next level of care: Skilled Nursing Facility Barriers to Discharge: Barriers Resolved   Patient Goals and CMS Choice Patient states their goals for this hospitalization and ongoing recovery are:: Go to rehab for therapy and then go home CMS Medicare.gov Compare Post Acute Care list provided to:: Patient Choice offered to / list presented to : Patient, Adult Children  Discharge Placement PASRR number recieved: 11/26/19            Patient chooses bed at: [name] Patient to be transferred to facility by: PTAR Name of family member notified: Daughter Daniel [name] Patient and family notified of of transfer: 11/27/19  Discharge Plan and Services In-house Referral: Clinical Social Work   Post Acute Care Choice: Nursing Home          DME Arranged: N/A DME Agency: NA       HH Arranged: NA HH Agency: NA        Social Determinants of Health (SDOH) Interventions     Readmission Risk Interventions No flowsheet data found.

## 2019-11-27 NOTE — TOC Progression Note (Addendum)
Transition of Care [facility]) - Progression Note    Patient Details  Name: Daniel [name] MRN: [id number] Date of Birth: [birth year]  [facility]) CM/SW Contact  Clearance Coots, LCSW Phone Number: 11/27/2019, 9:49 AM  Clinical Narrative:    CSW provided the patient daughter a list of SNF bed offers. Patient daughter notified the patient has orders to discharge to SNF today. Daughter to get back with CSW asap.  Patient covid negative.  Patient managed by [facility]. SNF of choice will have to start auth/waiver recognized.    Expected Discharge Plan: Skilled Nursing Facility Barriers to Discharge: Insurance Authorization  Expected Discharge Plan and Services Expected Discharge Plan: Skilled Nursing Facility In-house Referral: Clinical Social Work   Post Acute Care Choice: Nursing Home Living arrangements for the past 2 months: Single Family Home Expected Discharge Date: 11/27/19               DME Arranged: N/A DME Agency: NA       HH Arranged: NA HH Agency: NA         Social Determinants of Health (SDOH) Interventions    Readmission Risk Interventions No flowsheet data found.

## 2019-11-27 NOTE — Progress Notes (Signed)
Subjective:  Patient complains of bilateral knee pain.  The left side started hurting today.  The right side is feeling much better since aspiration yesterday.  Objective:   VITALS:   Vitals:   11/26/19 2053 11/27/19 0205 11/27/19 0626 11/27/19 1336  BP: 130/65 132/60 134/66 (!) 118/59  Pulse: (!) 104 88 95 89  Resp: 18 18 18 18   Temp: 99 F (37.2 C) 98 F (36.7 C) 100.1 F (37.8 C) 98.2 F (36.8 C)  TempSrc: Oral Oral Oral Oral  SpO2: 95% 94% 92% 96%  Weight:      Height:        NAD Examination of the right knee reveals no skin wounds or lesions.  Small effusion.  No warmth or erythema.  Limited range of motion due to pain.  Painless logrolling of the hip.  Examination of the left knee reveals a healed anterior incision.  He has an effusion.  No warmth or erythema.  Limited range of motion due to pain.  Painless logrolling of the hip.  Lab Results  Component Value Date   WBC 14.5 (H) 11/27/2019   HGB 10.8 (L) 11/27/2019   HCT 33.5 (L) 11/27/2019   MCV 99.4 11/27/2019   PLT 242 11/27/2019   BMET    Component Value Date/Time   NA 132 (L) 11/27/2019 0316   NA 130 (A) 03/24/2017 0000   K 4.2 11/27/2019 0316   CL 104 11/27/2019 0316   CO2 20 (L) 11/27/2019 0316   GLUCOSE 120 (H) 11/27/2019 0316   BUN 37 (H) 11/27/2019 0316   BUN 27 (A) 03/24/2017 0000   CREATININE 1.97 (H) 11/27/2019 0316   CREATININE 2.01 (H) 07/12/2018 1406   CREATININE 2.16 (H) 08/04/2017 1542   CALCIUM 8.4 (L) 11/27/2019 0316   GFRNONAA 30 (L) 11/27/2019 0316   GFRNONAA 30 (L) 07/12/2018 1406   GFRAA 35 (L) 11/27/2019 0316   GFRAA 35 (L) 07/12/2018 1406    Recent Results (from the past 240 hour(s))  SARS Coronavirus 2 by RT PCR (hospital order, performed in [facility] hospital lab) Nasopharyngeal Nasopharyngeal Swab     Status: None   Collection Time: 11/26/19  4:43 AM   Specimen: Nasopharyngeal Swab  Result Value Ref Range Status   SARS Coronavirus 2 NEGATIVE NEGATIVE Final     Comment: (NOTE) SARS-CoV-2 target nucleic acids are NOT DETECTED.  The SARS-CoV-2 RNA is generally detectable in upper and lower respiratory specimens during the acute phase of infection. The lowest concentration of SARS-CoV-2 viral copies this assay can detect is 250 copies / mL. A negative result does not preclude SARS-CoV-2 infection and should not be used as the sole basis for treatment or other patient management decisions.  A negative result may occur with improper specimen collection / handling, submission of specimen other than nasopharyngeal swab, presence of viral mutation(s) within the areas targeted by this assay, and inadequate number of viral copies (<250 copies / mL). A negative result must be combined with clinical observations, patient history, and epidemiological information.  Fact Sheet for Patients:   01/26/20  Fact Sheet for Healthcare Providers: BoilerBrush.com.cy  This test is not yet approved or  cleared by the https://pope.com/ FDA and has been authorized for detection and/or diagnosis of SARS-CoV-2 by FDA under an Emergency Use Authorization (EUA).  This EUA will remain in effect (meaning this test can be used) for the duration of the COVID-19 declaration under Section 564(b)(1) of the Act, 21 U.S.C. section 360bbb-3(b)(1), unless  the authorization is terminated or revoked sooner.  Performed at [facility], [address] [place], [place] [address]   Aerobic/Anaerobic Culture (surgical/deep wound)     Status: None (Preliminary result)   Collection Time: 11/26/19 10:24 AM   Specimen: KNEE; Synovial Fluid  Result Value Ref Range Status   Specimen Description   Final    KNEE RIGHT Performed at [facility], [address] [place], [place] [address]    Special Requests   Final    NONE Performed at [facility], [address],  [place], [place] [address]    Gram Stain   Final    ABUNDANT WBC PRESENT, PREDOMINANTLY PMN NO ORGANISMS SEEN Gram Stain Report Called to,Read Back By and Verified With: Heywood Footman RN @1126  ON 9.6.2021 BY [facility] Performed at [facility], [address] [place], [place]    Culture   Final    NO GROWTH < 24 HOURS Performed at [facility], [address] [place], [place]    Report Status PENDING  Incomplete    Right knee synovial fluid analysis 11/26/19: Synovial WBC 24,570 with 95% neutrophils.  Gram stain negative  Assessment/Plan:     Active Problems:   Dyslipidemia   GOUT   Essential hypertension   CKD (chronic kidney disease), stage III   Hip fracture (HCC)   AKI (acute kidney injury) (HCC)   Plan: I discussed the findings with the patient.  His right knee synovial fluid cell count is consistent with an inflammatory process.  He has a known history of pseudogout.  He began having left knee pain and effusion this morning.  I recommended a left knee aspiration and injection.  After verbal consent was obtained, the left knee was sterilely prepped with ChloraPrep solution.  I anesthetized the skin and subcutaneous tissues with 5 cc of 1% plain lidocaine.  I then inserted an 18-gauge needle into the suprapatellar pouch of the knee.  I aspirated about 10 cc of straw-colored cloudy fluid.  Through the same needle, I injected a mixture of 2 cc of 40 mg/cc Depo-Medrol plus local anesthetic.  The synovial fluid was sent to the lab for cell count with differential, crystal ID, and culture.  I also recommended injecting the right knee.  The right knee was sterilely prepped with ChloraPrep solution.  A mixture of 2 cc of 40 mg/cc Depo-Medrol plus local anesthetic was injected into the suprapatellar pouch of the right knee using aseptic technique.  Band-Aids were applied to both knees.  He tolerated this well.    With regards to the right greater  trochanter fracture, he can weight-bear as tolerated with a walker.  No active abduction for 6 weeks.  I recommend that he follow-up for his right greater trochanter fracture in 2 weeks.  All questions were solicited and answered.   01/26/20 Jahziel Sinn 11/27/2019, 5:15 PM   01/27/2020, MD [phone] [facility] is now [facility]  [place] [address], [address], [place], [place] Phone: [phone] [email or web address] Facebook  [phone]

## 2019-11-27 NOTE — Progress Notes (Signed)
Called to Daniel [name] for report (ext1), no answer, unable to leave message.

## 2019-11-27 NOTE — Progress Notes (Signed)
Occupational Therapy Evaluation  Patient with functional deficits listed below impacting safety and independence with self care. Patient require mod A for bed mobility, Mod A with functional transfer to recliner with cues for technique, sequencing, safety. Patient able to doff shirt in sitting to wash upper body, peri area and upper thighs due to urinal spill in bed. Recommend continued acute OT services to maximize patient activity tolerance, safety, mobility in order to facilitate D/C to venue listed below.    11/27/19 0900  OT Visit Information  Last OT Received On 11/27/19  Assistance Needed +1  History of Present Illness 86 yo male admitted with R greater trochanter fx, R knee effusion. Hx of CKD, Afib, gout, L patella fx, syncope  Precautions  Precautions Fall  Precaution Comments No active ABduction for 6 weeks per Ortho  Restrictions  Weight Bearing Restrictions No  RLE Weight Bearing WBAT  Home Living  Family/patient expects to be discharged to: Private residence  Living Arrangements Alone  Type of Home House  Home Access Stairs to enter  Entrance Stairs-Number of Steps 4  Entrance Stairs-Rails Right;Left  Home Layout One level  Home Equipment Walker - 2 wheels;Cane - single point  Prior Function  Level of Independence Independent  Communication  Communication HOH  Pain Assessment  Pain Assessment Faces  Faces Pain Scale 6  Pain Location R hip, R knee, Left Knee  Pain Descriptors / Indicators Discomfort;Sore;Aching  Pain Intervention(s) Patient requesting pain meds-RN notified;Monitored during session  Cognition  Arousal/Alertness Awake/alert  Behavior During Therapy WFL for tasks assessed/performed  Overall Cognitive Status Within Functional Limits for tasks assessed  Upper Extremity Assessment  Upper Extremity Assessment Generalized weakness  Lower Extremity Assessment  Lower Extremity Assessment Defer to PT evaluation  Cervical / Trunk Assessment  Cervical /  Trunk Assessment Kyphotic  ADL  Overall ADL's  Needs assistance/impaired  Grooming Set up;Sitting  Upper Body Bathing Set up;Sitting  Lower Body Bathing Sitting/lateral leans;Sit to/from stand;Maximal assistance  Lower Body Bathing Details (indicate cue type and reason) patient wash peri area and upper thighs in sitting   Upper Body Dressing  Set up;Sitting  Upper Body Dressing Details (indicate cue type and reason) patient able to doff tee shirt seated in chair  Lower Body Dressing Total assistance  Lower Body Dressing Details (indicate cue type and reason) to doff soiled underwear   Toilet Transfer Moderate assistance;Cueing for safety;Cueing for sequencing;Stand-pivot;BSC;RW  Toilet Transfer Details (indicate cue type and reason) mod A to power up to standing and stabilize, patient taking small shuffed steps to recliner  Toileting- Clothing Manipulation and Hygiene Maximal assistance;Sitting/lateral lean;Sit to/from stand  Functional mobility during ADLs Moderate assistance;Cueing for safety;Cueing for sequencing;Rolling walker  General ADL Comments patient requiring increased assistance with self care tasks due to pain, decreased activity tolerance, balance, safety   Bed Mobility  Overal bed mobility Needs Assistance  Bed Mobility Supine to Sit  Supine to sit Mod assist;HOB elevated  General bed mobility comments cues for technique and adherence to precautions, OT support LEs to EOB, patient able to push through L elbow and min A with trunk to sit upright  Transfers  Overall transfer level Needs assistance  Equipment used Rolling walker (2 wheeled)  Transfers Sit to/from [employer] Transfers  Sit to Stand Mod assist;From elevated surface  Stand pivot transfers Mod assist  General transfer comment modA to power up to standing and stabilize, cues for sequencing to back up to chair fully before attempting to sit, patient  able to take small shuffled steps  Balance  Overall balance  assessment Needs assistance;History of Falls  Sitting-balance support Feet supported  Sitting balance-Leahy Scale Fair  Standing balance support Bilateral upper extremity supported  Standing balance-Leahy Scale Poor  OT - End of Session  Equipment Utilized During Treatment Rolling walker  Activity Tolerance Patient limited by pain  Patient left in chair;with call bell/phone within reach;with chair alarm set  Nurse Communication Mobility status;Patient requests pain meds  OT Assessment  OT Recommendation/Assessment Patient needs continued OT Services  OT Visit Diagnosis Other abnormalities of gait and mobility (R26.89);Unsteadiness on feet (R26.81);History of falling (Z91.81);Pain  Pain - Right/Left Right  Pain - part of body Hip;Knee (left knee)  OT Problem List Decreased strength;Decreased activity tolerance;Impaired balance (sitting and/or standing);Decreased safety awareness;Pain  Barriers to Discharge Comments patient lives alone  OT Plan  OT Frequency (ACUTE ONLY) Min 2X/week  OT Treatment/Interventions (ACUTE ONLY) Self-care/ADL training  AM-PAC OT "6 Clicks" Daily Activity Outcome Measure (Version 2)  Help from another person eating meals? 4  Help from another person taking care of personal grooming? 3  Help from another person toileting, which includes using toliet, bedpan, or urinal? 2  Help from another person bathing (including washing, rinsing, drying)? 2  Help from another person to put on and taking off regular upper body clothing? 3  Help from another person to put on and taking off regular lower body clothing? 2  6 Click Score 16  OT Recommendation  Follow Up Recommendations SNF;Supervision/Assistance - 24 hour  OT Equipment Other (comment) (defer to next venue)  Individuals Consulted  Consulted and Agree with Results and Recommendations Patient  Acute Rehab OT Goals  Patient Stated Goal less pain  OT Goal Formulation With patient  Time For Goal Achievement  12/11/19  Potential to Achieve Goals Good  OT Time Calculation  OT Start Time (ACUTE ONLY) 0732  OT Stop Time (ACUTE ONLY) 0800  OT Time Calculation (min) 28 min  OT General Charges  $OT Visit 1 Visit  OT Evaluation  $OT Eval Low Complexity 1 Low  OT Treatments  $Self Care/Home Management  8-22 mins  Written Expression  Dominant Hand  (did not specify)   Marlyce Huge OT OT pager: [phone]

## 2019-11-28 DIAGNOSIS — I10 Essential (primary) hypertension: Secondary | ICD-10-CM | POA: Diagnosis not present

## 2019-11-28 DIAGNOSIS — F418 Other specified anxiety disorders: Secondary | ICD-10-CM | POA: Diagnosis not present

## 2019-11-28 DIAGNOSIS — M6281 Muscle weakness (generalized): Secondary | ICD-10-CM | POA: Diagnosis not present

## 2019-11-28 DIAGNOSIS — N179 Acute kidney failure, unspecified: Secondary | ICD-10-CM | POA: Diagnosis not present

## 2019-11-28 DIAGNOSIS — S72111D Displaced fracture of greater trochanter of right femur, subsequent encounter for closed fracture with routine healing: Secondary | ICD-10-CM | POA: Diagnosis not present

## 2019-11-28 DIAGNOSIS — Z4789 Encounter for other orthopedic aftercare: Secondary | ICD-10-CM | POA: Diagnosis not present

## 2019-11-28 DIAGNOSIS — N183 Chronic kidney disease, stage 3 unspecified: Secondary | ICD-10-CM | POA: Diagnosis not present

## 2019-11-28 DIAGNOSIS — R2681 Unsteadiness on feet: Secondary | ICD-10-CM | POA: Diagnosis not present

## 2019-11-28 DIAGNOSIS — M109 Gout, unspecified: Secondary | ICD-10-CM | POA: Diagnosis not present

## 2019-11-28 DIAGNOSIS — S72001A Fracture of unspecified part of neck of right femur, initial encounter for closed fracture: Secondary | ICD-10-CM | POA: Diagnosis not present

## 2019-11-29 DIAGNOSIS — S72111D Displaced fracture of greater trochanter of right femur, subsequent encounter for closed fracture with routine healing: Secondary | ICD-10-CM | POA: Diagnosis not present

## 2019-11-30 DIAGNOSIS — N183 Chronic kidney disease, stage 3 unspecified: Secondary | ICD-10-CM | POA: Diagnosis not present

## 2019-11-30 DIAGNOSIS — M109 Gout, unspecified: Secondary | ICD-10-CM | POA: Diagnosis not present

## 2019-11-30 DIAGNOSIS — I10 Essential (primary) hypertension: Secondary | ICD-10-CM | POA: Diagnosis not present

## 2019-11-30 DIAGNOSIS — S72001A Fracture of unspecified part of neck of right femur, initial encounter for closed fracture: Secondary | ICD-10-CM | POA: Diagnosis not present

## 2019-12-01 LAB — BODY FLUID CULTURE
Culture: NO GROWTH
Special Requests: NORMAL

## 2019-12-01 LAB — AEROBIC/ANAEROBIC CULTURE W GRAM STAIN (SURGICAL/DEEP WOUND): Culture: NO GROWTH

## 2019-12-03 LAB — ANAEROBIC CULTURE: Special Requests: NORMAL

## 2019-12-05 DIAGNOSIS — N183 Chronic kidney disease, stage 3 unspecified: Secondary | ICD-10-CM | POA: Diagnosis not present

## 2019-12-05 DIAGNOSIS — I10 Essential (primary) hypertension: Secondary | ICD-10-CM | POA: Diagnosis not present

## 2019-12-05 DIAGNOSIS — F418 Other specified anxiety disorders: Secondary | ICD-10-CM | POA: Diagnosis not present

## 2019-12-05 DIAGNOSIS — M109 Gout, unspecified: Secondary | ICD-10-CM | POA: Diagnosis not present

## 2019-12-05 DIAGNOSIS — S72001A Fracture of unspecified part of neck of right femur, initial encounter for closed fracture: Secondary | ICD-10-CM | POA: Diagnosis not present

## 2019-12-11 NOTE — Progress Notes (Signed)
Phone [phone] In person visit   Subjective:   Daniel [name] is a 86 y.o. year old very pleasant male patient who presents for/with See problem oriented charting Chief Complaint  Patient presents with  . Hip Injury   This visit occurred during the SARS-CoV-2 public health emergency.  Safety protocols were in place, including screening questions prior to the visit, additional usage of staff PPE, and extensive cleaning of exam room while observing appropriate contact time as indicated for disinfecting solutions.   Past Medical History-  Patient Active Problem List   Diagnosis Date Noted  . History of stroke 12/12/2019    Priority: High  . Hip fracture (HCC) 11/26/2019    Priority: High  . History of ITP     Priority: High  . Atrial fibrillation (HCC) 11/04/2006    Priority: High  . Leukocytosis 08/04/2017    Priority: Medium  . BPH (benign prostatic hyperplasia) 10/31/2013    Priority: Medium  . Dyslipidemia 08/24/2006    Priority: Medium  . GOUT 08/24/2006    Priority: Medium  . Essential hypertension 08/24/2006    Priority: Medium  . CKD (chronic kidney disease), stage III 08/24/2006    Priority: Medium  . Caregiver burden 04/06/2017    Priority: Low  . Encounter for therapeutic drug monitoring 07/05/2013    Priority: Low  . PULMONARY NODULE, RIGHT UPPER LOBE 11/24/2007    Priority: Low  . Thrombocytopenia (HCC) 05/24/2018  . Syncope 11/20/2017  . Bleeding 03/29/2014    Medications- reviewed and updated Current Outpatient Medications  Medication Sig Dispense Refill  . allopurinol (ZYLOPRIM) 100 MG tablet Take 1 tablet (100 mg total) by mouth 2 (two) times daily. (Patient taking differently: Take 100 mg by mouth daily. ) 180 tablet 1  . amLODipine (NORVASC) 5 MG tablet Take 1 tablet (5 mg total) by mouth daily. NEEDS CPE FOR MORE REFILLS 90 tablet 0  . atorvastatin (LIPITOR) 20 MG tablet TAKE ONE TABLET BY MOUTH DAILY AT 6P.M. (THIS REPLACES SIMVASTATIN) 90  tablet 0  . brimonidine (ALPHAGAN) 0.2 % ophthalmic solution Place 1 drop into both eyes 2 (two) times daily.     . folic acid (FOLVITE) 1 MG tablet Take 1 tablet (1 mg total) by mouth daily. (Patient taking differently: Take 1 mg by mouth every evening. ) 30 tablet 2  . tamsulosin (FLOMAX) 0.4 MG CAPS capsule Take 1 capsule (0.4 mg total) by mouth daily. 90 capsule 0   No current facility-administered medications for this visit.     Objective:  BP 118/60   Pulse 85   Temp 98.5 F (36.9 C) (Temporal)   Resp 18   Ht 5\' 6"  (1.676 m)   Wt 135 lb 6.4 oz (61.4 kg)   SpO2 96%   BMI 21.85 kg/m  Gen: NAD, resting comfortably CV: RRR no murmurs rubs or gallops Lungs: CTAB no crackles, wheeze, rhonchi Abdomen: soft/nontender/nondistended/normal bowel sounds.  Ext: no edema Skin: warm, dry Neuro: walks with walker    Assessment and Plan   #social update- moving to [place] to be closer with daughter likely 2021 or 2022  #Hospital F/U on Hip Fracture S: Hospital discharge summary reviewed.  Patient fell at a restaurant on his right side injuring his hip and knee on September 3.  Unclear cause of fall other than some imbalance and states he made a quick movement.  He did not hit his head.  He was able to walk and drive himself home immediately afterwards but noted  severe pain in the right hip and right knee and eventually called EMS with worsening pain.  He lives alone.  Creatinine up slightly at 2.23 up from baseline of 1.9 but improved before discharge, white blood count elevated to 17.3 thousand, COVID-19 negative.  CT of the head performed due to fall though he did not hit head-remote left cerebellar and left thalamic infarcts but otherwise unremarkable- due to fall risk and prior GI bleed does not want to take aspirin or coumadin.  Patient declined CT of cervical spine.  CT of the right hip with fracture to the greater trochanter of the proximal right femur.  CT of the right knee  showed severe tricompartmental degenerative changes with small to moderate joint effusion.  Orthopedics consulted and recommended nonoperative management with weightbearing as tolerated and no active abduction for 6 weeks.  Plan was for rehabilitation at skilled nursing facility  Patient's right knee began to bother him more during hospitalization and orthopedics performed an arthrocentesis of the right knee on September 6 which showed white blood count of 24,000 but no organisms on Gram stain-thought to be consistent with gout.Marland Kitchen  Discharge summary also mentions left knee began to bother him and plan was to consider arthrocentesis and steroid injection- he had arthrocentesis and suspects steroids.  A/P: Right hip fracture- continue weight bearing as tolerated with walker. His main question is if he can drive again but I would like orthopedics input first on this- recommended scheduling follow up with ortho- will place referrral  -knees are doing much better -Update CBC with differential today-leukocytosis during hospitalization may have increased due to stress demargination.  Patient with history of anemia as well-has seen oncology/hematology Dr. Candise Che in the past and no further follow-up suggested unless worsening pattern-they recommended regular lab work monitoring-fortunately patient has not had recurrent thrombocytopenia. Update CBC/CMP today  #Atrial fibrillation S: Compliant with warfarin in the past but patient discontinued this after ITP and prefers not to restart .  Rate controlled without medication. A/P: remains rate controlled without meds. Declines aspirin or coumadin   #Dyslipidemia/history of stroke on CT September 2021 S: Compliant with atorvastatin 20 mg.  Lab Results  Component Value Date   CHOL 119 12/08/2018   HDL 34.00 (L) 12/08/2018   LDLCALC 65 12/08/2018   LDLDIRECT 160.7 09/13/2008   TRIG 96.0 12/08/2018   CHOLHDL 3 12/08/2018   A/P:  LDL goal less than 70 due to history  of stroke.  Update lipid panel today-was at goal on last check so hopefully continue current medication.  We also discussed starting aspirin 81 mg or couumadin due to history of stroke potentially- history of thrombocytopenia and ITP in 2019 as well as baseline anemia typically as low as around 10 for hemoglobin -he opts out   #Gout S: Compliant with allopurinol 100 mg (listed as twice daily but .  Flare as above likely related to trauma .  Lab Results  Component Value Date   LABURIC 3.4 (L) 12/08/2018   A/P: Uric acid has been at goal so I think clear was related to trauma.  We will still update uric acid with blood work today.  NSAIDs to be avoided with chronic kidney disease  #CKD stage III S:GFR in 30s.  Patient knows to avoid NSAIDs  A/P: Since there was some variation in hospitalization-update CMP with labs today  #Essential hypertension S:Controlled on amlodipine 5 mg  BP Readings from Last 3 Encounters:  12/12/19 118/60  11/27/19 122/61  07/12/18 135/74  A/P: Well-controlled-continue current medication  #BPH S: Stable on Flomax 0.4 mg daily-compliant with medication. A/P: reasonable control- denies orthostatic hypotension before the episode above   Recommended follow up: Return in about 3 months (around 03/12/2020) for follow up- or sooner if needed.  Lab/Order associations:   ICD-10-CM   1. Closed fracture of right hip, initial encounter [facility])  S72.001A Ambulatory referral to Orthopedic Surgery  2. History of stroke  Z86.73   3. Acute gout of knee, unspecified cause, unspecified laterality  M10.9 Uric acid  4. Permanent atrial fibrillation (HCC)  I48.21   5. Dyslipidemia  E78.5 CBC With Differential/Platelet    COMPLETE METABOLIC PANEL WITH GFR    Lipid Panel (Refl)  6. Leukocytosis, unspecified type  D72.829   7. History of ITP  Z86.2   8. Essential hypertension  I10   9. Stage 3b chronic kidney disease  N18.32   10. Benign prostatic hyperplasia without lower  urinary tract symptoms  N40.0    Time Spent: 40 minutes of total time (8:35 AM- 9:15 AM) was spent on the date of the encounter performing the following actions: chart review prior to seeing the patient, obtaining history, performing a medically necessary exam, counseling on the treatment plan, placing orders, and documenting in our EHR.   Return precautions advised.  Tana Conch, MD

## 2019-12-11 NOTE — Patient Instructions (Addendum)
Health Maintenance Due  Topic Date Due  . COVID-19 Vaccine (1) please strongly consider this Never done  . INFLUENZA VACCINE Declined in flu shot 10/21/2019   Please stop by lab before you go If you have mychart- we will send your results within 3 business days of [place] receiving them.  If you do not have mychart- we will call you about results within 5 business days of [place] receiving them.  *please note we are currently using [facility] labs which has a longer processing time than [place] typically so labs may not come back as quickly as in the past *please also note that you will see labs on mychart as soon as they post. I will later go in and write notes on them- will say "notes from Dr. Durene Cal"  We will call you within two weeks about your referral to [facility]. If you do not hear within 2 weeks, give [place] a call. Follow up with orthopedics - they can release you back to driving based on the hip.   I wish you the best in your transfer to [place]- please let me know after you have moved and have new doctorso I can update the chart

## 2019-12-12 ENCOUNTER — Encounter: Payer: Self-pay | Admitting: Family Medicine

## 2019-12-12 ENCOUNTER — Other Ambulatory Visit: Payer: Self-pay

## 2019-12-12 ENCOUNTER — Telehealth: Payer: Self-pay | Admitting: Family Medicine

## 2019-12-12 ENCOUNTER — Ambulatory Visit (INDEPENDENT_AMBULATORY_CARE_PROVIDER_SITE_OTHER): Payer: Medicare HMO | Admitting: Family Medicine

## 2019-12-12 VITALS — BP 118/60 | HR 85 | Temp 98.5°F | Resp 18 | Ht 66.0 in | Wt 135.4 lb

## 2019-12-12 DIAGNOSIS — N1832 Chronic kidney disease, stage 3b: Secondary | ICD-10-CM

## 2019-12-12 DIAGNOSIS — M160 Bilateral primary osteoarthritis of hip: Secondary | ICD-10-CM | POA: Diagnosis not present

## 2019-12-12 DIAGNOSIS — S72001A Fracture of unspecified part of neck of right femur, initial encounter for closed fracture: Secondary | ICD-10-CM

## 2019-12-12 DIAGNOSIS — Z862 Personal history of diseases of the blood and blood-forming organs and certain disorders involving the immune mechanism: Secondary | ICD-10-CM | POA: Diagnosis not present

## 2019-12-12 DIAGNOSIS — N179 Acute kidney failure, unspecified: Secondary | ICD-10-CM | POA: Diagnosis not present

## 2019-12-12 DIAGNOSIS — E785 Hyperlipidemia, unspecified: Secondary | ICD-10-CM | POA: Diagnosis not present

## 2019-12-12 DIAGNOSIS — M109 Gout, unspecified: Secondary | ICD-10-CM

## 2019-12-12 DIAGNOSIS — N400 Enlarged prostate without lower urinary tract symptoms: Secondary | ICD-10-CM | POA: Diagnosis not present

## 2019-12-12 DIAGNOSIS — D72829 Elevated white blood cell count, unspecified: Secondary | ICD-10-CM

## 2019-12-12 DIAGNOSIS — M170 Bilateral primary osteoarthritis of knee: Secondary | ICD-10-CM | POA: Diagnosis not present

## 2019-12-12 DIAGNOSIS — I4821 Permanent atrial fibrillation: Secondary | ICD-10-CM

## 2019-12-12 DIAGNOSIS — Z8673 Personal history of transient ischemic attack (TIA), and cerebral infarction without residual deficits: Secondary | ICD-10-CM | POA: Diagnosis not present

## 2019-12-12 DIAGNOSIS — I10 Essential (primary) hypertension: Secondary | ICD-10-CM | POA: Diagnosis not present

## 2019-12-12 DIAGNOSIS — M10362 Gout due to renal impairment, left knee: Secondary | ICD-10-CM | POA: Diagnosis not present

## 2019-12-12 DIAGNOSIS — S72111D Displaced fracture of greater trochanter of right femur, subsequent encounter for closed fracture with routine healing: Secondary | ICD-10-CM | POA: Diagnosis not present

## 2019-12-12 DIAGNOSIS — I129 Hypertensive chronic kidney disease with stage 1 through stage 4 chronic kidney disease, or unspecified chronic kidney disease: Secondary | ICD-10-CM | POA: Diagnosis not present

## 2019-12-12 DIAGNOSIS — M10361 Gout due to renal impairment, right knee: Secondary | ICD-10-CM | POA: Diagnosis not present

## 2019-12-12 NOTE — Telephone Encounter (Signed)
Physical Therapist from [facility] called stating they performed initial evaluation on pt today for PT. Requesting orders for 1 x 1 week, 2 x 4 week. Please advise.

## 2019-12-12 NOTE — Patient Outreach (Signed)
[facility]) Care Management  12/12/2019  Daniel [name] [birth year] [id number]   Referral Date: 12/12/19 Referral Source: [employer] Report Date of Discharge: 12/09/19 Facility: [name] Insurance: [facility]  Outreach attempt: Spoke with patient who reports he is doing great.  Discharged on Sunday from [facility].  Getting around with walker and saw PCP today for follow up. He states a church friend took him to the doctor today.    Social: Patient reports he lives alone but is independent with care.  Has support of multiple church members.  He reports he has children but his son has lots of health issues as well. He declines needing any assistance other than to appointments to which church friends have agreed to take him.   Conditions: Patient with recent fall resulting in a hip fracture. Patient also reports some HTN and gout for which are controlled with medications.    Consent: RN CM reviewed [facility] services with patient. Patient declined services at this time.   Plan: RN CM will send letter and close case.    Daniel Leriche, RN, MSN [facility] Care Management Care Management Coordinator Direct Line [phone] Toll Free: [phone]  Fax: [phone]

## 2019-12-13 DIAGNOSIS — M160 Bilateral primary osteoarthritis of hip: Secondary | ICD-10-CM | POA: Diagnosis not present

## 2019-12-13 DIAGNOSIS — N400 Enlarged prostate without lower urinary tract symptoms: Secondary | ICD-10-CM | POA: Diagnosis not present

## 2019-12-13 DIAGNOSIS — M10361 Gout due to renal impairment, right knee: Secondary | ICD-10-CM | POA: Diagnosis not present

## 2019-12-13 DIAGNOSIS — N179 Acute kidney failure, unspecified: Secondary | ICD-10-CM | POA: Diagnosis not present

## 2019-12-13 DIAGNOSIS — M10362 Gout due to renal impairment, left knee: Secondary | ICD-10-CM | POA: Diagnosis not present

## 2019-12-13 DIAGNOSIS — S72111D Displaced fracture of greater trochanter of right femur, subsequent encounter for closed fracture with routine healing: Secondary | ICD-10-CM | POA: Diagnosis not present

## 2019-12-13 DIAGNOSIS — M170 Bilateral primary osteoarthritis of knee: Secondary | ICD-10-CM | POA: Diagnosis not present

## 2019-12-13 DIAGNOSIS — I129 Hypertensive chronic kidney disease with stage 1 through stage 4 chronic kidney disease, or unspecified chronic kidney disease: Secondary | ICD-10-CM | POA: Diagnosis not present

## 2019-12-13 DIAGNOSIS — N1832 Chronic kidney disease, stage 3b: Secondary | ICD-10-CM | POA: Diagnosis not present

## 2019-12-13 LAB — COMPLETE METABOLIC PANEL WITH GFR
AG Ratio: 1.2 (calc) (ref 1.0–2.5)
ALT: 21 U/L (ref 9–46)
AST: 24 U/L (ref 10–35)
Albumin: 3.7 g/dL (ref 3.6–5.1)
Alkaline phosphatase (APISO): 181 U/L — ABNORMAL HIGH (ref 35–144)
BUN/Creatinine Ratio: 23 (calc) — ABNORMAL HIGH (ref 6–22)
BUN: 39 mg/dL — ABNORMAL HIGH (ref 7–25)
CO2: 21 mmol/L (ref 20–32)
Calcium: 8.6 mg/dL (ref 8.6–10.3)
Chloride: 105 mmol/L (ref 98–110)
Creat: 1.72 mg/dL — ABNORMAL HIGH (ref 0.70–1.11)
GFR, Est African American: 41 mL/min/{1.73_m2} — ABNORMAL LOW (ref >=60)
GFR, Est Non African American: 35 mL/min/{1.73_m2} — ABNORMAL LOW (ref >=60)
Globulin: 3.1 g/dL (calc) (ref 1.9–3.7)
Glucose, Bld: 103 mg/dL — ABNORMAL HIGH (ref 65–99)
Potassium: 4.6 mmol/L (ref 3.5–5.3)
Sodium: 137 mmol/L (ref 135–146)
Total Bilirubin: 0.4 mg/dL (ref 0.2–1.2)
Total Protein: 6.8 g/dL (ref 6.1–8.1)

## 2019-12-13 LAB — URIC ACID: Uric Acid, Serum: 5 mg/dL (ref 4.0–8.0)

## 2019-12-13 LAB — LIPID PANEL (REFL)
Cholesterol: 184 mg/dL (ref <200)
HDL: 57 mg/dL (ref >=40)
LDL Cholesterol (Calc): 108 mg/dL (calc) — ABNORMAL HIGH
Non-HDL Cholesterol (Calc): 127 mg/dL (calc) (ref <130)
Total CHOL/HDL Ratio: 3.2 (calc) (ref <5.0)
Triglycerides: 96 mg/dL (ref <150)

## 2019-12-13 LAB — CBC WITH DIFFERENTIAL/PLATELET
Absolute Monocytes: 1469 cells/uL — ABNORMAL HIGH (ref 200–950)
Basophils Absolute: 71 cells/uL (ref 0–200)
Basophils Relative: 0.4 %
Eosinophils Absolute: 159 cells/uL (ref 15–500)
Eosinophils Relative: 0.9 %
HCT: 33.9 % — ABNORMAL LOW (ref 38.5–50.0)
Hemoglobin: 11.6 g/dL — ABNORMAL LOW (ref 13.2–17.1)
Lymphs Abs: 2850 cells/uL (ref 850–3900)
MCH: 33.4 pg — ABNORMAL HIGH (ref 27.0–33.0)
MCHC: 34.2 g/dL (ref 32.0–36.0)
MCV: 97.7 fL (ref 80.0–100.0)
MPV: 11.3 fL (ref 7.5–12.5)
Monocytes Relative: 8.3 %
Neutro Abs: 13151 cells/uL — ABNORMAL HIGH (ref 1500–7800)
Neutrophils Relative %: 74.3 %
Platelets: 286 10*3/uL (ref 140–400)
RBC: 3.47 10*6/uL — ABNORMAL LOW (ref 4.20–5.80)
RDW: 13.4 % (ref 11.0–15.0)
Total Lymphocyte: 16.1 %
WBC: 17.7 10*3/uL — ABNORMAL HIGH (ref 3.8–10.8)

## 2019-12-13 NOTE — Telephone Encounter (Signed)
LVM with Debroah Loop letting him know that Dr. Durene Cal is on board with the patient doing PT. Office number was provided in case he has any questions or concerns.

## 2019-12-13 NOTE — Telephone Encounter (Signed)
Yes thanks 

## 2019-12-14 ENCOUNTER — Telehealth: Payer: Self-pay

## 2019-12-14 DIAGNOSIS — N179 Acute kidney failure, unspecified: Secondary | ICD-10-CM | POA: Diagnosis not present

## 2019-12-14 DIAGNOSIS — N400 Enlarged prostate without lower urinary tract symptoms: Secondary | ICD-10-CM | POA: Diagnosis not present

## 2019-12-14 DIAGNOSIS — M10362 Gout due to renal impairment, left knee: Secondary | ICD-10-CM | POA: Diagnosis not present

## 2019-12-14 DIAGNOSIS — N1832 Chronic kidney disease, stage 3b: Secondary | ICD-10-CM | POA: Diagnosis not present

## 2019-12-14 DIAGNOSIS — M160 Bilateral primary osteoarthritis of hip: Secondary | ICD-10-CM | POA: Diagnosis not present

## 2019-12-14 DIAGNOSIS — S72111D Displaced fracture of greater trochanter of right femur, subsequent encounter for closed fracture with routine healing: Secondary | ICD-10-CM | POA: Diagnosis not present

## 2019-12-14 DIAGNOSIS — M170 Bilateral primary osteoarthritis of knee: Secondary | ICD-10-CM | POA: Diagnosis not present

## 2019-12-14 DIAGNOSIS — M10361 Gout due to renal impairment, right knee: Secondary | ICD-10-CM | POA: Diagnosis not present

## 2019-12-14 DIAGNOSIS — I129 Hypertensive chronic kidney disease with stage 1 through stage 4 chronic kidney disease, or unspecified chronic kidney disease: Secondary | ICD-10-CM | POA: Diagnosis not present

## 2019-12-14 NOTE — Telephone Encounter (Signed)
Spoke with Beth from [facility] to give verbal orders for pt. No other questions or concerns at this time.

## 2019-12-14 NOTE — Telephone Encounter (Signed)
Pt was discharged from hospital. Nurse from [facility] is requesting orders for nursing to see pt 1x a week for 4 weeks.    [phone] Beth/ [facility]  Okay to leave voicemail

## 2019-12-17 ENCOUNTER — Ambulatory Visit: Payer: Medicare HMO | Admitting: Podiatry

## 2019-12-17 DIAGNOSIS — N179 Acute kidney failure, unspecified: Secondary | ICD-10-CM | POA: Diagnosis not present

## 2019-12-17 DIAGNOSIS — N1832 Chronic kidney disease, stage 3b: Secondary | ICD-10-CM | POA: Diagnosis not present

## 2019-12-17 DIAGNOSIS — M10361 Gout due to renal impairment, right knee: Secondary | ICD-10-CM | POA: Diagnosis not present

## 2019-12-17 DIAGNOSIS — M170 Bilateral primary osteoarthritis of knee: Secondary | ICD-10-CM | POA: Diagnosis not present

## 2019-12-17 DIAGNOSIS — M10362 Gout due to renal impairment, left knee: Secondary | ICD-10-CM | POA: Diagnosis not present

## 2019-12-17 DIAGNOSIS — N400 Enlarged prostate without lower urinary tract symptoms: Secondary | ICD-10-CM | POA: Diagnosis not present

## 2019-12-17 DIAGNOSIS — S72111D Displaced fracture of greater trochanter of right femur, subsequent encounter for closed fracture with routine healing: Secondary | ICD-10-CM | POA: Diagnosis not present

## 2019-12-17 DIAGNOSIS — I129 Hypertensive chronic kidney disease with stage 1 through stage 4 chronic kidney disease, or unspecified chronic kidney disease: Secondary | ICD-10-CM | POA: Diagnosis not present

## 2019-12-17 DIAGNOSIS — M160 Bilateral primary osteoarthritis of hip: Secondary | ICD-10-CM | POA: Diagnosis not present

## 2019-12-18 ENCOUNTER — Other Ambulatory Visit: Payer: Self-pay

## 2019-12-18 DIAGNOSIS — S72111D Displaced fracture of greater trochanter of right femur, subsequent encounter for closed fracture with routine healing: Secondary | ICD-10-CM | POA: Diagnosis not present

## 2019-12-18 DIAGNOSIS — M10361 Gout due to renal impairment, right knee: Secondary | ICD-10-CM | POA: Diagnosis not present

## 2019-12-18 DIAGNOSIS — N1832 Chronic kidney disease, stage 3b: Secondary | ICD-10-CM | POA: Diagnosis not present

## 2019-12-18 DIAGNOSIS — S72114A Nondisplaced fracture of greater trochanter of right femur, initial encounter for closed fracture: Secondary | ICD-10-CM | POA: Diagnosis not present

## 2019-12-18 DIAGNOSIS — I129 Hypertensive chronic kidney disease with stage 1 through stage 4 chronic kidney disease, or unspecified chronic kidney disease: Secondary | ICD-10-CM | POA: Diagnosis not present

## 2019-12-18 DIAGNOSIS — M10362 Gout due to renal impairment, left knee: Secondary | ICD-10-CM | POA: Diagnosis not present

## 2019-12-18 DIAGNOSIS — N400 Enlarged prostate without lower urinary tract symptoms: Secondary | ICD-10-CM | POA: Diagnosis not present

## 2019-12-18 DIAGNOSIS — M170 Bilateral primary osteoarthritis of knee: Secondary | ICD-10-CM | POA: Diagnosis not present

## 2019-12-18 DIAGNOSIS — N179 Acute kidney failure, unspecified: Secondary | ICD-10-CM | POA: Diagnosis not present

## 2019-12-18 DIAGNOSIS — M160 Bilateral primary osteoarthritis of hip: Secondary | ICD-10-CM | POA: Diagnosis not present

## 2019-12-18 MED ORDER — ROSUVASTATIN CALCIUM 20 MG PO TABS: 20.0000 mg | ORAL_TABLET | Freq: Every day | ORAL | 3 refills | Status: AC

## 2019-12-21 DIAGNOSIS — M10362 Gout due to renal impairment, left knee: Secondary | ICD-10-CM | POA: Diagnosis not present

## 2019-12-21 DIAGNOSIS — M160 Bilateral primary osteoarthritis of hip: Secondary | ICD-10-CM | POA: Diagnosis not present

## 2019-12-21 DIAGNOSIS — S72111D Displaced fracture of greater trochanter of right femur, subsequent encounter for closed fracture with routine healing: Secondary | ICD-10-CM | POA: Diagnosis not present

## 2019-12-21 DIAGNOSIS — N179 Acute kidney failure, unspecified: Secondary | ICD-10-CM | POA: Diagnosis not present

## 2019-12-21 DIAGNOSIS — M170 Bilateral primary osteoarthritis of knee: Secondary | ICD-10-CM | POA: Diagnosis not present

## 2019-12-21 DIAGNOSIS — N400 Enlarged prostate without lower urinary tract symptoms: Secondary | ICD-10-CM | POA: Diagnosis not present

## 2019-12-21 DIAGNOSIS — N1832 Chronic kidney disease, stage 3b: Secondary | ICD-10-CM | POA: Diagnosis not present

## 2019-12-21 DIAGNOSIS — M10361 Gout due to renal impairment, right knee: Secondary | ICD-10-CM | POA: Diagnosis not present

## 2019-12-21 DIAGNOSIS — I129 Hypertensive chronic kidney disease with stage 1 through stage 4 chronic kidney disease, or unspecified chronic kidney disease: Secondary | ICD-10-CM | POA: Diagnosis not present

## 2019-12-24 DIAGNOSIS — N179 Acute kidney failure, unspecified: Secondary | ICD-10-CM | POA: Diagnosis not present

## 2019-12-24 DIAGNOSIS — N1832 Chronic kidney disease, stage 3b: Secondary | ICD-10-CM | POA: Diagnosis not present

## 2019-12-24 DIAGNOSIS — N400 Enlarged prostate without lower urinary tract symptoms: Secondary | ICD-10-CM | POA: Diagnosis not present

## 2019-12-24 DIAGNOSIS — M10362 Gout due to renal impairment, left knee: Secondary | ICD-10-CM | POA: Diagnosis not present

## 2019-12-24 DIAGNOSIS — M170 Bilateral primary osteoarthritis of knee: Secondary | ICD-10-CM | POA: Diagnosis not present

## 2019-12-24 DIAGNOSIS — M10361 Gout due to renal impairment, right knee: Secondary | ICD-10-CM | POA: Diagnosis not present

## 2019-12-24 DIAGNOSIS — S72111D Displaced fracture of greater trochanter of right femur, subsequent encounter for closed fracture with routine healing: Secondary | ICD-10-CM | POA: Diagnosis not present

## 2019-12-24 DIAGNOSIS — I129 Hypertensive chronic kidney disease with stage 1 through stage 4 chronic kidney disease, or unspecified chronic kidney disease: Secondary | ICD-10-CM | POA: Diagnosis not present

## 2019-12-24 DIAGNOSIS — M160 Bilateral primary osteoarthritis of hip: Secondary | ICD-10-CM | POA: Diagnosis not present

## 2019-12-25 DIAGNOSIS — N179 Acute kidney failure, unspecified: Secondary | ICD-10-CM | POA: Diagnosis not present

## 2019-12-25 DIAGNOSIS — N400 Enlarged prostate without lower urinary tract symptoms: Secondary | ICD-10-CM | POA: Diagnosis not present

## 2019-12-25 DIAGNOSIS — N1832 Chronic kidney disease, stage 3b: Secondary | ICD-10-CM | POA: Diagnosis not present

## 2019-12-25 DIAGNOSIS — M160 Bilateral primary osteoarthritis of hip: Secondary | ICD-10-CM | POA: Diagnosis not present

## 2019-12-25 DIAGNOSIS — S72111D Displaced fracture of greater trochanter of right femur, subsequent encounter for closed fracture with routine healing: Secondary | ICD-10-CM | POA: Diagnosis not present

## 2019-12-25 DIAGNOSIS — I129 Hypertensive chronic kidney disease with stage 1 through stage 4 chronic kidney disease, or unspecified chronic kidney disease: Secondary | ICD-10-CM | POA: Diagnosis not present

## 2019-12-25 DIAGNOSIS — M10362 Gout due to renal impairment, left knee: Secondary | ICD-10-CM | POA: Diagnosis not present

## 2019-12-25 DIAGNOSIS — M170 Bilateral primary osteoarthritis of knee: Secondary | ICD-10-CM | POA: Diagnosis not present

## 2019-12-25 DIAGNOSIS — M10361 Gout due to renal impairment, right knee: Secondary | ICD-10-CM | POA: Diagnosis not present

## 2019-12-26 ENCOUNTER — Telehealth: Payer: Self-pay

## 2019-12-26 DIAGNOSIS — S72111D Displaced fracture of greater trochanter of right femur, subsequent encounter for closed fracture with routine healing: Secondary | ICD-10-CM | POA: Diagnosis not present

## 2019-12-26 DIAGNOSIS — M10361 Gout due to renal impairment, right knee: Secondary | ICD-10-CM | POA: Diagnosis not present

## 2019-12-26 DIAGNOSIS — M170 Bilateral primary osteoarthritis of knee: Secondary | ICD-10-CM | POA: Diagnosis not present

## 2019-12-26 DIAGNOSIS — N400 Enlarged prostate without lower urinary tract symptoms: Secondary | ICD-10-CM | POA: Diagnosis not present

## 2019-12-26 DIAGNOSIS — N1832 Chronic kidney disease, stage 3b: Secondary | ICD-10-CM | POA: Diagnosis not present

## 2019-12-26 DIAGNOSIS — I129 Hypertensive chronic kidney disease with stage 1 through stage 4 chronic kidney disease, or unspecified chronic kidney disease: Secondary | ICD-10-CM | POA: Diagnosis not present

## 2019-12-26 DIAGNOSIS — N179 Acute kidney failure, unspecified: Secondary | ICD-10-CM | POA: Diagnosis not present

## 2019-12-26 DIAGNOSIS — M10362 Gout due to renal impairment, left knee: Secondary | ICD-10-CM | POA: Diagnosis not present

## 2019-12-26 DIAGNOSIS — M160 Bilateral primary osteoarthritis of hip: Secondary | ICD-10-CM | POA: Diagnosis not present

## 2019-12-26 NOTE — Telephone Encounter (Signed)
Home Health Certification or Plan of Care Tracking  Is this a Certification or Plan of Care?  [facility] Agency: [facility]urb[facility]   Order Number:  [id number]   Where has form been placed:  [facility]

## 2019-12-26 NOTE — Telephone Encounter (Signed)
Patient does not need any more home care. Beth from [facility] states patient does not want anymore nurse visits. And does not meet the need more home care anymore, patient has been cleared and is out and about. Just a FYI call

## 2019-12-26 NOTE — Telephone Encounter (Signed)
Noted thanks °

## 2019-12-26 NOTE — Telephone Encounter (Signed)
Noted  

## 2019-12-26 NOTE — Telephone Encounter (Signed)
FYI

## 2019-12-27 DIAGNOSIS — S72111D Displaced fracture of greater trochanter of right femur, subsequent encounter for closed fracture with routine healing: Secondary | ICD-10-CM | POA: Diagnosis not present

## 2019-12-27 DIAGNOSIS — I129 Hypertensive chronic kidney disease with stage 1 through stage 4 chronic kidney disease, or unspecified chronic kidney disease: Secondary | ICD-10-CM | POA: Diagnosis not present

## 2019-12-27 DIAGNOSIS — D72829 Elevated white blood cell count, unspecified: Secondary | ICD-10-CM

## 2019-12-27 DIAGNOSIS — N1832 Chronic kidney disease, stage 3b: Secondary | ICD-10-CM | POA: Diagnosis not present

## 2019-12-27 DIAGNOSIS — E785 Hyperlipidemia, unspecified: Secondary | ICD-10-CM

## 2019-12-27 DIAGNOSIS — M170 Bilateral primary osteoarthritis of knee: Secondary | ICD-10-CM | POA: Diagnosis not present

## 2019-12-27 DIAGNOSIS — M10361 Gout due to renal impairment, right knee: Secondary | ICD-10-CM | POA: Diagnosis not present

## 2019-12-27 DIAGNOSIS — Z602 Problems related to living alone: Secondary | ICD-10-CM

## 2019-12-27 DIAGNOSIS — Z8673 Personal history of transient ischemic attack (TIA), and cerebral infarction without residual deficits: Secondary | ICD-10-CM

## 2019-12-27 DIAGNOSIS — Z9181 History of falling: Secondary | ICD-10-CM

## 2019-12-27 DIAGNOSIS — M10362 Gout due to renal impairment, left knee: Secondary | ICD-10-CM | POA: Diagnosis not present

## 2019-12-27 DIAGNOSIS — N400 Enlarged prostate without lower urinary tract symptoms: Secondary | ICD-10-CM | POA: Diagnosis not present

## 2019-12-27 DIAGNOSIS — M160 Bilateral primary osteoarthritis of hip: Secondary | ICD-10-CM | POA: Diagnosis not present

## 2019-12-27 DIAGNOSIS — N179 Acute kidney failure, unspecified: Secondary | ICD-10-CM | POA: Diagnosis not present

## 2019-12-28 NOTE — Telephone Encounter (Signed)
Paper work has been faxed

## 2020-01-10 ENCOUNTER — Ambulatory Visit: Payer: Medicare HMO | Admitting: Podiatry

## 2020-01-11 ENCOUNTER — Ambulatory Visit: Payer: Medicare HMO | Admitting: Podiatry

## 2020-01-11 ENCOUNTER — Other Ambulatory Visit: Payer: Self-pay

## 2020-01-11 DIAGNOSIS — B351 Tinea unguium: Secondary | ICD-10-CM | POA: Diagnosis not present

## 2020-01-11 DIAGNOSIS — M79676 Pain in unspecified toe(s): Secondary | ICD-10-CM

## 2020-01-11 NOTE — Progress Notes (Signed)
Subjective: 86 y.o. returns the office today for painful, elongated, thickened toenails which he cannot trim himself. No new concerns or any changes since I last saw him. Denies any systemic complaints such as fevers, chills, nausea, vomiting.   Objective: AAO 3, NAD DP/PT pulses palpable, CRT less than 3 seconds Nails hypertrophic, dystrophic, elongated, brittle, discolored 10. Incurvation present to the medial hallux toenails. No edema, erythema or signs of infection.  No pain with calf compression, swelling, warmth, erythema. No changes.   Assessment: Patient presents with symptomatic onychomycosis; ingrown toenail  Plan: -Treatment options including alternatives, risks, complications were discussed -Nails sharply debrided 10 without complication/bleeding. -Monitor hallux ingrown toenails. If needed we can preform a partial nail avulsion but currently not causing pain. -Discussed daily foot inspection. If there are any changes, to call the office immediately.  -Follow-up in as scheduled or sooner if any problems are to arise. In the meantime, encouraged to call the office with any questions, concerns, changes symptoms.  *patient is moving to [place] in 2 weeks  Ovid Curd, DPM

## 2020-01-15 ENCOUNTER — Telehealth: Payer: Self-pay

## 2020-01-15 NOTE — Telephone Encounter (Signed)
FYI

## 2020-01-15 NOTE — Telephone Encounter (Signed)
Pt called and stated he is moving to [place] in a few weeks. Removed Dr. Durene Cal as PCP.

## 2020-01-15 NOTE — Telephone Encounter (Signed)
Noted-thanks we will miss him

## 2020-03-31 ENCOUNTER — Ambulatory Visit: Payer: Medicare HMO | Admitting: Family Medicine

## 2020-04-01 DIAGNOSIS — H5213 Myopia, bilateral: Secondary | ICD-10-CM | POA: Diagnosis not present

## 2020-04-07 ENCOUNTER — Other Ambulatory Visit: Payer: Self-pay | Admitting: Family Medicine

## 2020-04-10 DIAGNOSIS — H409 Unspecified glaucoma: Secondary | ICD-10-CM | POA: Diagnosis not present

## 2020-04-10 DIAGNOSIS — Z0000 Encounter for general adult medical examination without abnormal findings: Secondary | ICD-10-CM | POA: Diagnosis not present

## 2020-04-10 DIAGNOSIS — I10 Essential (primary) hypertension: Secondary | ICD-10-CM | POA: Diagnosis not present

## 2020-04-10 DIAGNOSIS — E782 Mixed hyperlipidemia: Secondary | ICD-10-CM | POA: Diagnosis not present

## 2020-04-10 DIAGNOSIS — M6281 Muscle weakness (generalized): Secondary | ICD-10-CM | POA: Diagnosis not present

## 2020-04-10 DIAGNOSIS — N401 Enlarged prostate with lower urinary tract symptoms: Secondary | ICD-10-CM | POA: Diagnosis not present

## 2020-04-10 DIAGNOSIS — M109 Gout, unspecified: Secondary | ICD-10-CM | POA: Diagnosis not present

## 2020-05-13 ENCOUNTER — Other Ambulatory Visit: Payer: Self-pay | Admitting: Family Medicine

## 2020-07-09 ENCOUNTER — Other Ambulatory Visit: Payer: Self-pay | Admitting: Family Medicine

## 2020-07-17 DIAGNOSIS — M6281 Muscle weakness (generalized): Secondary | ICD-10-CM | POA: Diagnosis not present

## 2020-07-17 DIAGNOSIS — M109 Gout, unspecified: Secondary | ICD-10-CM | POA: Diagnosis not present

## 2020-07-17 DIAGNOSIS — N401 Enlarged prostate with lower urinary tract symptoms: Secondary | ICD-10-CM | POA: Diagnosis not present

## 2020-07-17 DIAGNOSIS — E782 Mixed hyperlipidemia: Secondary | ICD-10-CM | POA: Diagnosis not present

## 2020-07-17 DIAGNOSIS — H409 Unspecified glaucoma: Secondary | ICD-10-CM | POA: Diagnosis not present

## 2020-07-17 DIAGNOSIS — I10 Essential (primary) hypertension: Secondary | ICD-10-CM | POA: Diagnosis not present

## 2020-09-06 ENCOUNTER — Other Ambulatory Visit: Payer: Self-pay | Admitting: Family Medicine

## 2020-11-21 ENCOUNTER — Other Ambulatory Visit: Payer: Self-pay | Admitting: Family Medicine

## 2020-12-03 ENCOUNTER — Other Ambulatory Visit: Payer: Self-pay | Admitting: Family Medicine

## 2021-06-03 ENCOUNTER — Other Ambulatory Visit: Payer: Self-pay | Admitting: Family Medicine
